# Patient Record
Sex: Male | Born: 1960 | Race: White | Hispanic: No | Marital: Single | Smoking: Current every day smoker
Health system: Southern US, Community
[De-identification: ages and names within clinical notes are randomized; demographics above are authoritative.]

## PROBLEM LIST (undated history)

## (undated) DIAGNOSIS — E785 Hyperlipidemia, unspecified: Secondary | ICD-10-CM

## (undated) DIAGNOSIS — K219 Gastro-esophageal reflux disease without esophagitis: Secondary | ICD-10-CM

## (undated) DIAGNOSIS — G8929 Other chronic pain: Secondary | ICD-10-CM

## (undated) DIAGNOSIS — G709 Myoneural disorder, unspecified: Secondary | ICD-10-CM

## (undated) DIAGNOSIS — I219 Acute myocardial infarction, unspecified: Secondary | ICD-10-CM

## (undated) DIAGNOSIS — F419 Anxiety disorder, unspecified: Secondary | ICD-10-CM

## (undated) DIAGNOSIS — M542 Cervicalgia: Secondary | ICD-10-CM

## (undated) DIAGNOSIS — E119 Type 2 diabetes mellitus without complications: Secondary | ICD-10-CM

## (undated) DIAGNOSIS — I2510 Atherosclerotic heart disease of native coronary artery without angina pectoris: Secondary | ICD-10-CM

## (undated) DIAGNOSIS — R51 Headache: Secondary | ICD-10-CM

## (undated) DIAGNOSIS — M1990 Unspecified osteoarthritis, unspecified site: Secondary | ICD-10-CM

## (undated) DIAGNOSIS — M961 Postlaminectomy syndrome, not elsewhere classified: Secondary | ICD-10-CM

## (undated) DIAGNOSIS — M549 Dorsalgia, unspecified: Secondary | ICD-10-CM

## (undated) DIAGNOSIS — R519 Headache, unspecified: Secondary | ICD-10-CM

## (undated) DIAGNOSIS — I10 Essential (primary) hypertension: Secondary | ICD-10-CM

## (undated) HISTORY — PX: CARDIAC CATHETERIZATION: SHX172

## (undated) HISTORY — PX: BACK SURGERY: SHX140

## (undated) HISTORY — PX: CORONARY ANGIOPLASTY: SHX604

## (undated) HISTORY — DX: Type 2 diabetes mellitus without complications: E11.9

## (undated) HISTORY — DX: Essential (primary) hypertension: I10

## (undated) HISTORY — PX: TONSILLECTOMY: SUR1361

## (undated) HISTORY — PX: APPENDECTOMY: SHX54

---

## 2008-07-23 ENCOUNTER — Ambulatory Visit (HOSPITAL_COMMUNITY): Admission: RE | Admit: 2008-07-23 | Discharge: 2008-07-23 | Payer: Self-pay | Admitting: Family Medicine

## 2008-12-15 ENCOUNTER — Ambulatory Visit (HOSPITAL_COMMUNITY): Admission: RE | Admit: 2008-12-15 | Discharge: 2008-12-15 | Payer: Self-pay | Admitting: Family Medicine

## 2011-08-26 ENCOUNTER — Emergency Department (HOSPITAL_COMMUNITY)
Admission: EM | Admit: 2011-08-26 | Discharge: 2011-08-26 | Disposition: A | Discharge status: Home / Self Care | Payer: Self-pay | Attending: Emergency Medicine | Admitting: Emergency Medicine

## 2011-08-26 ENCOUNTER — Encounter (HOSPITAL_COMMUNITY): Payer: Self-pay

## 2011-08-26 DIAGNOSIS — I10 Essential (primary) hypertension: Secondary | ICD-10-CM | POA: Insufficient documentation

## 2011-08-26 DIAGNOSIS — E119 Type 2 diabetes mellitus without complications: Secondary | ICD-10-CM | POA: Insufficient documentation

## 2011-08-26 DIAGNOSIS — I2510 Atherosclerotic heart disease of native coronary artery without angina pectoris: Secondary | ICD-10-CM | POA: Insufficient documentation

## 2011-08-26 DIAGNOSIS — Z7982 Long term (current) use of aspirin: Secondary | ICD-10-CM | POA: Insufficient documentation

## 2011-08-26 DIAGNOSIS — M5126 Other intervertebral disc displacement, lumbar region: Secondary | ICD-10-CM | POA: Insufficient documentation

## 2011-08-26 DIAGNOSIS — M5430 Sciatica, unspecified side: Secondary | ICD-10-CM

## 2011-08-26 HISTORY — DX: Anxiety disorder, unspecified: F41.9

## 2011-08-26 HISTORY — DX: Atherosclerotic heart disease of native coronary artery without angina pectoris: I25.10

## 2011-08-26 MED ORDER — METHOCARBAMOL 500 MG PO TABS: ORAL_TABLET | ORAL | Status: DC

## 2011-08-26 MED ORDER — KETOROLAC TROMETHAMINE 60 MG/2ML IM SOLN
60.0000 mg | Freq: Once | INTRAMUSCULAR | Status: AC
  Administered 2011-08-26: 60 mg via INTRAMUSCULAR
  Filled 2011-08-26: qty 2

## 2011-08-26 MED ORDER — NAPROXEN 500 MG PO TABS: 500.0000 mg | ORAL_TABLET | Freq: Two times a day (BID) | ORAL | Status: DC

## 2011-08-26 MED ORDER — OXYCODONE-ACETAMINOPHEN 5-325 MG PO TABS
1.0000 | ORAL_TABLET | Freq: Once | ORAL | Status: AC
  Administered 2011-08-26: 1 via ORAL
  Filled 2011-08-26: qty 1

## 2011-08-26 MED ORDER — HYDROCODONE-ACETAMINOPHEN 7.5-325 MG PO TABS: 1.0000 | ORAL_TABLET | Freq: Four times a day (QID) | ORAL | Status: AC | PRN

## 2011-08-26 NOTE — Progress Notes (Signed)
CSW received call from pt this morning regarding pain.  Pt states he does not have a PCP and [facility] is closed.  CSW advised if pain was that severe then pt may need to be seen in ED which pt agreed.  Karn Cassis

## 2011-08-26 NOTE — ED Notes (Signed)
Pt states he had back surgery in 2009. Complain of pain in both legs that he thinks is coming from his low back

## 2011-08-26 NOTE — ED Provider Notes (Signed)
History     CSN: [id number]  Arrival date & time 08/26/11  2952   First MD Initiated Contact with Patient 08/26/11 1100      Chief Complaint  Patient presents with  . Leg Pain    (Consider location/radiation/quality/duration/timing/severity/associated sxs/prior treatment) HPI Comments: Patient c/o chronic low back pain that radiates into his bilateral hips and into his thighs.  Describes the pain as dull with occasional sharp pains.  He denies numbness, incontinence of feces or urine, abd pain or dysuria.    Patient is a 51 y.o. male presenting with back pain. The history is provided by the patient. No language interpreter was used.  Back Pain  This is a chronic problem. The current episode started more than 1 week ago. The problem occurs constantly. The problem has been gradually worsening. The pain is associated with no known injury. The pain is present in the lumbar spine and sacro-iliac joint. The quality of the pain is described as aching. The pain radiates to the right thigh and left thigh. The pain is at a severity of 9/10. The pain is moderate. The symptoms are aggravated by bending, twisting and certain positions. The pain is the same all the time. Associated symptoms include leg pain and tingling. Pertinent negatives include no chest pain, no fever, no numbness, no abdominal pain, no abdominal swelling, no bowel incontinence, no perianal numbness, no bladder incontinence, no dysuria, no pelvic pain, no paresthesias, no paresis and no weakness. Treatments tried: tramadol. The treatment provided no relief.    Past Medical History  Diagnosis Date  . Anxiety   . Hypertension   . Diabetes mellitus   . Coronary artery disease     Past Surgical History  Procedure Date  . Back surgery   . Appendectomy   . Tonsillectomy     History reviewed. No pertinent family history.  History  Substance Use Topics  . Smoking status: Current Everyday Smoker    Types: Cigarettes  .  Smokeless tobacco: Not on file  . Alcohol Use: No      Review of Systems  Constitutional: Negative for fever and chills.  HENT: Negative for neck pain and neck stiffness.   Respiratory: Negative for chest tightness and shortness of breath.   Cardiovascular: Negative for chest pain.  Gastrointestinal: Negative for nausea, vomiting, abdominal pain and bowel incontinence.  Genitourinary: Negative for bladder incontinence, dysuria, hematuria, flank pain, difficulty urinating and pelvic pain.  Musculoskeletal: Positive for back pain. Negative for myalgias, joint swelling, arthralgias and gait problem.  Skin: Negative for rash.  Neurological: Positive for tingling. Negative for dizziness, weakness, numbness and paresthesias.  Hematological: Does not bruise/bleed easily.  All other systems reviewed and are negative.    Allergies  Review of patient's allergies indicates no known allergies.  Home Medications   Current Outpatient Rx  Name Route Sig Dispense Refill  . ACETAMINOPHEN 500 MG PO TABS Oral Take 1,000 mg by mouth every 6 (six) hours as needed. Leg pain    . ASPIRIN EC 81 MG PO TBEC Oral Take 81 mg by mouth daily.    Marland Kitchen GABAPENTIN 600 MG PO TABS Oral Take 600 mg by mouth 3 (three) times daily.    . IBUPROFEN 200 MG PO TABS Oral Take 400 mg by mouth every 8 (eight) hours as needed. Leg pain    . METFORMIN HCL 500 MG PO TABS Oral Take 500 mg by mouth 2 (two) times daily with a meal. Takes 2 tabs in the  morning and 1 tablet in the evening    . METOPROLOL TARTRATE 100 MG PO TABS Oral Take 100 mg by mouth daily.    Marland Kitchen NAPROXEN SODIUM 220 MG PO CAPS Oral Take 2 capsules by mouth every 12 (twelve) hours as needed. Leg pain    . PRAVASTATIN SODIUM 40 MG PO TABS Oral Take by mouth at bedtime.    . QUINAPRIL-HYDROCHLOROTHIAZIDE 20-25 MG PO TABS Oral Take 1 tablet by mouth daily.    . TRAMADOL-ACETAMINOPHEN 37.5-325 MG PO TABS Oral Take 2 tablets by mouth 2 (two) times daily.      BP 130/85   Pulse 81  Temp(Src) 98.2 F (36.8 C) (Oral)  Resp 20  SpO2 100%  Physical Exam  Nursing note and vitals reviewed. Constitutional: He is oriented to person, place, and time. He appears well-developed and well-nourished. No distress.  HENT:  Head: Normocephalic and atraumatic.  Neck: Normal range of motion. Neck supple.  Cardiovascular: Normal rate, regular rhythm and normal heart sounds.   Pulmonary/Chest: Effort normal and breath sounds normal. No respiratory distress.  Abdominal: Soft. He exhibits no distension. There is no tenderness.  Musculoskeletal: Normal range of motion. He exhibits tenderness. He exhibits no edema.       Lumbar back: He exhibits tenderness and bony tenderness. He exhibits normal range of motion, no swelling, no edema, no deformity, no laceration and normal pulse.       Back:  Lymphadenopathy:    He has no cervical adenopathy.  Neurological: He is alert and oriented to person, place, and time. No sensory deficit. He exhibits normal muscle tone. Coordination and gait normal.  Reflex Scores:      Patellar reflexes are 2+ on the right side and 2+ on the left side.      Achilles reflexes are 2+ on the right side and 2+ on the left side. Skin: Skin is warm and dry.    ED Course  Procedures (including critical care time)      MDM    MR from 2010 was reviewed by me.  Shows disc extrusion in the left lateral recess at L5-S1 with mass  effect on the left S1 nerve root.    No focal neuro deficits on exam, ttp of the lower lumbar paraspinal muscles and SI joints.  Patient ambulates with steady gait.  He agrees to f/u with his pmd at the health dept.         Staley Budzinski L. Desiraye Rolfson, [place] 08/28/11 2030

## 2011-08-29 NOTE — ED Provider Notes (Signed)
Medical screening examination/treatment/procedure(s) were performed by non-physician practitioner and as supervising physician I was immediately available for consultation/collaboration. Ricketta Colantonio, MD, FACEP   Bexlee Bergdoll L Osha Errico, MD 08/29/11 0506 

## 2011-09-28 ENCOUNTER — Ambulatory Visit: Payer: Self-pay | Admitting: Family Medicine

## 2011-09-28 VITALS — BP 155/89 | HR 59 | Temp 98.1°F | Resp 16 | Ht 74.5 in | Wt 250.0 lb

## 2011-09-28 DIAGNOSIS — E7800 Pure hypercholesterolemia, unspecified: Secondary | ICD-10-CM

## 2011-09-28 DIAGNOSIS — M549 Dorsalgia, unspecified: Secondary | ICD-10-CM

## 2011-09-28 DIAGNOSIS — I10 Essential (primary) hypertension: Secondary | ICD-10-CM

## 2011-09-28 DIAGNOSIS — E119 Type 2 diabetes mellitus without complications: Secondary | ICD-10-CM | POA: Insufficient documentation

## 2011-09-28 MED ORDER — PRAVASTATIN SODIUM 40 MG PO TABS: 40.0000 mg | ORAL_TABLET | Freq: Every day | ORAL | Status: DC

## 2011-09-28 MED ORDER — NAPROXEN 500 MG PO TABS: 500.0000 mg | ORAL_TABLET | Freq: Two times a day (BID) | ORAL | Status: DC

## 2011-09-28 MED ORDER — METOPROLOL TARTRATE 100 MG PO TABS: 100.0000 mg | ORAL_TABLET | Freq: Every day | ORAL | Status: DC

## 2011-09-28 MED ORDER — TRAMADOL-ACETAMINOPHEN 37.5-325 MG PO TABS: 2.0000 | ORAL_TABLET | Freq: Two times a day (BID) | ORAL | Status: DC

## 2011-09-28 MED ORDER — HYDROCODONE-ACETAMINOPHEN 10-325 MG PO TABS: 1.0000 | ORAL_TABLET | Freq: Three times a day (TID) | ORAL | Status: AC | PRN

## 2011-09-28 MED ORDER — QUINAPRIL-HYDROCHLOROTHIAZIDE 20-25 MG PO TABS: 1.0000 | ORAL_TABLET | Freq: Every day | ORAL | Status: DC

## 2011-09-28 NOTE — Progress Notes (Signed)
  Subjective:    Patient ID: Clayton Foster, male    DOB: [birth year], 51 y.o.   MRN: [id number]  HPI 51 yo male with multiple medical problems including DM, HTN, HLD, and back pain here to establish care and obtain medication refills.  Brings some medical records with him from [facility] - most recent visit 2010 but labs done in Nov 2012.  Also has MRI and xray reports of spine.  Today, has run out of some meds and close to running out of some others.  Needs ultracet, naprosyn, metoprolol, accuretic, pravastatin.  Also requests norco.  States [place] wouldn't do pain pills anymore.   Pain is from bulging discs in his back.  Ultracet and naprosyn help but don't take them away completely.  Labs, including cmet and FLP just done in November 2012.  A1C was 6.1%.  Does not need metformin refill today.        Review of Systems Negative except as per HPI     Objective:   Physical Exam  Constitutional: He appears well-developed and well-nourished.  Cardiovascular: Normal rate, regular rhythm, normal heart sounds and intact distal pulses.   No murmur heard. Pulmonary/Chest: Effort normal and breath sounds normal.  Neurological: He is alert.  Skin: Skin is warm and dry.          Assessment & Plan:  HTN, HLD - meds refilled  Chronic pain - refilled ultracet and naprosyn.  Discussed we don't do chronic narcotics.  Can do one Norco rx, one time only, to help with pain while we send him to pain doctor.  Will call with name of doctor he would like to see ( has been researching on internet )

## 2011-10-10 ENCOUNTER — Telehealth: Payer: Self-pay

## 2011-10-10 DIAGNOSIS — M549 Dorsalgia, unspecified: Secondary | ICD-10-CM

## 2011-10-10 NOTE — Telephone Encounter (Signed)
Patient saw Dr. Georgiana Shore and she asked what doctor he would like to be referred to for pain management. He states he wants to see Dr. Craig Guess, office phone [phone].

## 2011-12-26 ENCOUNTER — Encounter: Payer: Self-pay | Admitting: Family Medicine

## 2011-12-31 ENCOUNTER — Encounter (HOSPITAL_COMMUNITY): Payer: Self-pay | Admitting: *Deleted

## 2011-12-31 ENCOUNTER — Emergency Department (HOSPITAL_COMMUNITY)
Admission: EM | Admit: 2011-12-31 | Discharge: 2011-12-31 | Disposition: A | Discharge status: Home / Self Care | Payer: Self-pay | Attending: Emergency Medicine | Admitting: Emergency Medicine

## 2011-12-31 DIAGNOSIS — E119 Type 2 diabetes mellitus without complications: Secondary | ICD-10-CM | POA: Insufficient documentation

## 2011-12-31 DIAGNOSIS — F17200 Nicotine dependence, unspecified, uncomplicated: Secondary | ICD-10-CM | POA: Insufficient documentation

## 2011-12-31 DIAGNOSIS — I2510 Atherosclerotic heart disease of native coronary artery without angina pectoris: Secondary | ICD-10-CM | POA: Insufficient documentation

## 2011-12-31 DIAGNOSIS — M5416 Radiculopathy, lumbar region: Secondary | ICD-10-CM

## 2011-12-31 DIAGNOSIS — I10 Essential (primary) hypertension: Secondary | ICD-10-CM | POA: Insufficient documentation

## 2011-12-31 DIAGNOSIS — Z79899 Other long term (current) drug therapy: Secondary | ICD-10-CM | POA: Insufficient documentation

## 2011-12-31 DIAGNOSIS — G8929 Other chronic pain: Secondary | ICD-10-CM | POA: Insufficient documentation

## 2011-12-31 DIAGNOSIS — IMO0002 Reserved for concepts with insufficient information to code with codable children: Secondary | ICD-10-CM | POA: Insufficient documentation

## 2011-12-31 DIAGNOSIS — Z7982 Long term (current) use of aspirin: Secondary | ICD-10-CM | POA: Insufficient documentation

## 2011-12-31 HISTORY — DX: Other chronic pain: G89.29

## 2011-12-31 HISTORY — DX: Dorsalgia, unspecified: M54.9

## 2011-12-31 MED ORDER — METHOCARBAMOL 500 MG PO TABS
1000.0000 mg | ORAL_TABLET | Freq: Once | ORAL | Status: AC
  Administered 2011-12-31: 1000 mg via ORAL
  Filled 2011-12-31: qty 2

## 2011-12-31 MED ORDER — PREDNISONE 10 MG PO TABS: ORAL_TABLET | ORAL | Status: DC

## 2011-12-31 MED ORDER — OXYCODONE-ACETAMINOPHEN 5-325 MG PO TABS: 1.0000 | ORAL_TABLET | ORAL | Status: AC | PRN

## 2011-12-31 MED ORDER — HYDROMORPHONE HCL PF 1 MG/ML IJ SOLN
1.0000 mg | Freq: Once | INTRAMUSCULAR | Status: AC
  Administered 2011-12-31: 1 mg via INTRAMUSCULAR
  Filled 2011-12-31: qty 1

## 2011-12-31 NOTE — ED Notes (Signed)
Chronic back pain that radiates down both legs

## 2011-12-31 NOTE — Discharge Instructions (Signed)

## 2012-01-05 NOTE — ED Provider Notes (Signed)
History     CSN: [id number]  Arrival date & time 12/31/11  1323   First MD Initiated Contact with Patient 12/31/11 1407      Chief Complaint  Patient presents with  . Back Pain    (Consider location/radiation/quality/duration/timing/severity/associated sxs/prior treatment) Patient is a 51 y.o. male presenting with back pain. The history is provided by the patient.  Back Pain  This is a chronic problem. Episode onset: worsening pain for 3-4 days. The problem occurs constantly. The problem has been gradually worsening. The pain is associated with no known injury. The pain is present in the lumbar spine and sacro-iliac joint. The quality of the pain is described as aching, burning and shooting. The pain radiates to the left thigh, right thigh, left knee and right knee. The pain is moderate. The symptoms are aggravated by bending, twisting and certain positions. Associated symptoms include leg pain. Pertinent negatives include no chest pain, no fever, no numbness, no abdominal pain, no abdominal swelling, no bowel incontinence, no perianal numbness, no bladder incontinence, no dysuria, no pelvic pain, no paresthesias, no paresis, no tingling and no weakness. He has tried muscle relaxants, analgesics and NSAIDs for the symptoms. The treatment provided mild relief.    Past Medical History  Diagnosis Date  . Anxiety   . Hypertension   . Diabetes mellitus   . Coronary artery disease   . Chronic back pain     Past Surgical History  Procedure Date  . Back surgery   . Appendectomy   . Tonsillectomy     History reviewed. No pertinent family history.  History  Substance Use Topics  . Smoking status: Current Everyday Smoker -- 1.0 packs/day    Types: Cigarettes  . Smokeless tobacco: Not on file  . Alcohol Use: No      Review of Systems  Constitutional: Negative for fever and appetite change.  Respiratory: Negative for shortness of breath.   Cardiovascular: Negative for chest pain.   Gastrointestinal: Negative for vomiting, abdominal pain, constipation and bowel incontinence.  Genitourinary: Negative for bladder incontinence, dysuria, hematuria, flank pain, decreased urine volume, difficulty urinating and pelvic pain.       Perineal numbness or incontinence of urine or feces  Musculoskeletal: Positive for back pain. Negative for joint swelling.  Skin: Negative for rash.  Neurological: Negative for tingling, weakness, numbness and paresthesias.  All other systems reviewed and are negative.    Allergies  Review of patient's allergies indicates no known allergies.  Home Medications   Current Outpatient Rx  Name Route Sig Dispense Refill  . ACETAMINOPHEN 500 MG PO TABS Oral Take 1,000 mg by mouth every 6 (six) hours as needed. Leg pain    . ASPIRIN EC 81 MG PO TBEC Oral Take 81 mg by mouth daily.    Marland Kitchen GABAPENTIN 600 MG PO TABS Oral Take 600 mg by mouth 3 (three) times daily.    . IBUPROFEN 200 MG PO TABS Oral Take 400 mg by mouth every 8 (eight) hours as needed. Leg pain    . METFORMIN HCL 500 MG PO TABS Oral Take 500 mg by mouth 2 (two) times daily with a meal. Takes 2 tabs in the morning and 1 tablet in the evening    . METOPROLOL TARTRATE 100 MG PO TABS Oral Take 1 tablet (100 mg total) by mouth daily. 30 tablet 1  . NAPROXEN 500 MG PO TABS Oral Take 1 tablet (500 mg total) by mouth 2 (two) times daily. Take with  food 60 tablet 1  . PRAVASTATIN SODIUM 40 MG PO TABS Oral Take 1 tablet (40 mg total) by mouth at bedtime. 30 tablet 1  . PSEUDOEPHEDRINE-GUAIFENESIN ER 60-600 MG PO TB12 Oral Take 1 tablet by mouth as needed. For congestion    . QUINAPRIL-HYDROCHLOROTHIAZIDE 20-25 MG PO TABS Oral Take 1 tablet by mouth daily. 30 tablet 1  . TRAMADOL-ACETAMINOPHEN 37.5-325 MG PO TABS Oral Take 2 tablets by mouth 2 (two) times daily. 120 tablet 1  . OXYCODONE-ACETAMINOPHEN 5-325 MG PO TABS Oral Take 1 tablet by mouth every 4 (four) hours as needed for pain. 24 tablet 0  .  PREDNISONE 10 MG PO TABS  Take 6 tablets day one, 5 tablets day two, 4 tablets day three, 3 tablets day four, 2 tablets day five, then 1 tablet day six 21 tablet 0    BP 138/80  Pulse 66  Temp 97.4 F (36.3 C) (Oral)  Resp 21  Ht 6\' 3"  (1.905 m)  Wt 245 lb (111.131 kg)  BMI 30.62 kg/m2  SpO2 100%  Physical Exam  Nursing note and vitals reviewed. Constitutional: He is oriented to person, place, and time. He appears well-developed and well-nourished. No distress.  HENT:  Head: Normocephalic and atraumatic.  Neck: Normal range of motion. Neck supple.  Cardiovascular: Normal rate, regular rhythm and intact distal pulses.   No murmur heard. Pulmonary/Chest: Effort normal and breath sounds normal.  Musculoskeletal: He exhibits tenderness. He exhibits no edema.       Lumbar back: He exhibits tenderness and pain. He exhibits normal range of motion, no swelling, no deformity, no laceration and normal pulse.       Back:  Neurological: He is alert and oriented to person, place, and time. No cranial nerve deficit or sensory deficit. He exhibits normal muscle tone. Coordination and gait normal.  Reflex Scores:      Patellar reflexes are 2+ on the right side and 2+ on the left side.      Achilles reflexes are 2+ on the right side and 2+ on the left side. Skin: Skin is warm and dry.    ED Course  Procedures (including critical care time)    1. Lumbar radiculopathy       MDM    Previous medical charts, nursing notes and vitals signs from this visit were reviewed by me   All laboratory results and/or imaging results performed on this visit, if applicable, were reviewed by me and discussed with the patient and/or parent as well as recommendation for follow-up    MEDICATIONS GIVEN IN ED:  Dilaudid IM, robaxin po  Patient is feeling better,  has diffuse ttp of the lumbar paraspinal muscles.  No focal neuro deficits on exam.  Ambulates with a steady gait. Reviewed previous MRI.  Pt  advised to f/u with PMD      PRESCRIPTIONS GIVEN AT DISCHARGE:  prednisone taper, percocet #24     Pt stable in ED with no significant deterioration in condition. Pt feels improved after observation and/or treatment in ED. Patient / Family / Caregiver understand and agree with initial ED impression and plan with expectations set for ED visit.  Patient agrees to return to ED for any worsening symptoms        Arrion Burruel L. Zoey Gilkeson, [place] 01/05/12 1401

## 2012-01-06 NOTE — ED Provider Notes (Signed)
Medical screening examination/treatment/procedure(s) were performed by non-physician practitioner and as supervising physician I was immediately available for consultation/collaboration.  Donnetta Hutching, MD 01/06/12 1314

## 2012-02-16 ENCOUNTER — Encounter (HOSPITAL_COMMUNITY): Payer: Self-pay

## 2012-02-16 ENCOUNTER — Emergency Department (HOSPITAL_COMMUNITY)
Admission: EM | Admit: 2012-02-16 | Discharge: 2012-02-16 | Disposition: A | Discharge status: Home / Self Care | Payer: Self-pay | Attending: Emergency Medicine | Admitting: Emergency Medicine

## 2012-02-16 DIAGNOSIS — I10 Essential (primary) hypertension: Secondary | ICD-10-CM | POA: Insufficient documentation

## 2012-02-16 DIAGNOSIS — G8929 Other chronic pain: Secondary | ICD-10-CM | POA: Insufficient documentation

## 2012-02-16 DIAGNOSIS — F411 Generalized anxiety disorder: Secondary | ICD-10-CM | POA: Insufficient documentation

## 2012-02-16 DIAGNOSIS — E119 Type 2 diabetes mellitus without complications: Secondary | ICD-10-CM | POA: Insufficient documentation

## 2012-02-16 DIAGNOSIS — M549 Dorsalgia, unspecified: Secondary | ICD-10-CM

## 2012-02-16 DIAGNOSIS — F17200 Nicotine dependence, unspecified, uncomplicated: Secondary | ICD-10-CM | POA: Insufficient documentation

## 2012-02-16 DIAGNOSIS — M545 Low back pain, unspecified: Secondary | ICD-10-CM | POA: Insufficient documentation

## 2012-02-16 DIAGNOSIS — Z9089 Acquired absence of other organs: Secondary | ICD-10-CM | POA: Insufficient documentation

## 2012-02-16 DIAGNOSIS — I2510 Atherosclerotic heart disease of native coronary artery without angina pectoris: Secondary | ICD-10-CM | POA: Insufficient documentation

## 2012-02-16 MED ORDER — HYDROMORPHONE HCL PF 2 MG/ML IJ SOLN
INTRAMUSCULAR | Status: AC
  Administered 2012-02-16: 2 mg via INTRAMUSCULAR
  Filled 2012-02-16: qty 1

## 2012-02-16 MED ORDER — CYCLOBENZAPRINE HCL 10 MG PO TABS: 10.0000 mg | ORAL_TABLET | Freq: Three times a day (TID) | ORAL | Status: AC | PRN

## 2012-02-16 MED ORDER — OXYCODONE-ACETAMINOPHEN 5-325 MG PO TABS: 1.0000 | ORAL_TABLET | ORAL | Status: AC | PRN

## 2012-02-16 MED ORDER — HYDROMORPHONE HCL PF 1 MG/ML IJ SOLN: 2.0000 mg | Freq: Once | INTRAMUSCULAR | Status: AC

## 2012-02-16 NOTE — ED Notes (Signed)
Pt with chronic lower back pain that radiates down into both legs per pt, pt denies PCP due to no insurance and states that he is trying to get disability, unable to go to pain management as well

## 2012-02-16 NOTE — ED Notes (Signed)
Pt reports chronic pain to his back and legs bilaterally.  Pt reports that he is out of his pain medication and does not have insurance to see a doctor.

## 2012-02-16 NOTE — ED Provider Notes (Signed)
History     CSN: [id number]  Arrival date & time 02/16/12  1522   First MD Initiated Contact with Patient 02/16/12 1543      Chief Complaint  Patient presents with  . Back Pain  . Leg Pain    (Consider location/radiation/quality/duration/timing/severity/associated sxs/prior treatment) HPI Comments: Patient c/o acute on chronic lower back pain that radiates into both legs.  States he is awaiting for approval of his disability.  States he is out of his pain medication.  Denies incontinence or urine or feces.  States pain today is similar to previous   Patient is a 51 y.o. male presenting with back pain. The history is provided by the patient.  Back Pain  This is a chronic problem. The problem occurs constantly. The problem has been gradually worsening. The pain is associated with no known injury. The pain is present in the lumbar spine. The quality of the pain is described as aching. The pain radiates to the left thigh, left knee, left foot, right thigh, right knee and right foot. The pain is severe. The symptoms are aggravated by twisting, bending and certain positions. The pain is the same all the time. Associated symptoms include leg pain. Pertinent negatives include no chest pain, no fever, no numbness, no abdominal pain, no abdominal swelling, no bowel incontinence, no perianal numbness, no bladder incontinence, no dysuria, no pelvic pain, no paresthesias, no paresis, no tingling and no weakness. He has tried muscle relaxants and NSAIDs for the symptoms. The treatment provided no relief.    Past Medical History  Diagnosis Date  . Anxiety   . Hypertension   . Diabetes mellitus   . Coronary artery disease   . Chronic back pain     Past Surgical History  Procedure Date  . Back surgery   . Appendectomy   . Tonsillectomy     No family history on file.  History  Substance Use Topics  . Smoking status: Current Everyday Smoker -- 1.0 packs/day    Types: Cigarettes  . Smokeless  tobacco: Not on file  . Alcohol Use: No      Review of Systems  Constitutional: Negative for fever.  Respiratory: Negative for shortness of breath.   Cardiovascular: Negative for chest pain.  Gastrointestinal: Negative for vomiting, abdominal pain, constipation and bowel incontinence.  Genitourinary: Negative for bladder incontinence, dysuria, hematuria, flank pain, decreased urine volume, difficulty urinating and pelvic pain.       Perineal numbness or incontinence of urine or feces  Musculoskeletal: Positive for back pain. Negative for joint swelling.  Skin: Negative for rash.  Neurological: Negative for tingling, weakness, numbness and paresthesias.  All other systems reviewed and are negative.    Allergies  Review of patient's allergies indicates no known allergies.  Home Medications   Current Outpatient Rx  Name Route Sig Dispense Refill  . ACETAMINOPHEN 500 MG PO TABS Oral Take 1,000 mg by mouth every 6 (six) hours as needed. Leg pain    . ASPIRIN EC 81 MG PO TBEC Oral Take 81 mg by mouth daily.    Marland Kitchen GABAPENTIN 600 MG PO TABS Oral Take 600 mg by mouth 3 (three) times daily.    . IBUPROFEN 200 MG PO TABS Oral Take 400 mg by mouth every 8 (eight) hours as needed. Leg pain    . METFORMIN HCL 500 MG PO TABS Oral Take 500 mg by mouth 2 (two) times daily with a meal. Takes 2 tabs in the morning and 1  tablet in the evening    . METOPROLOL TARTRATE 100 MG PO TABS Oral Take 1 tablet (100 mg total) by mouth daily. 30 tablet 1  . NAPROXEN 500 MG PO TABS Oral Take 1 tablet (500 mg total) by mouth 2 (two) times daily. Take with food 60 tablet 1  . PRAVASTATIN SODIUM 40 MG PO TABS Oral Take 1 tablet (40 mg total) by mouth at bedtime. 30 tablet 1  . PREDNISONE 10 MG PO TABS  Take 6 tablets day one, 5 tablets day two, 4 tablets day three, 3 tablets day four, 2 tablets day five, then 1 tablet day six 21 tablet 0  . PSEUDOEPHEDRINE-GUAIFENESIN ER 60-600 MG PO TB12 Oral Take 1 tablet by mouth  as needed. For congestion    . QUINAPRIL-HYDROCHLOROTHIAZIDE 20-25 MG PO TABS Oral Take 1 tablet by mouth daily. 30 tablet 1  . TRAMADOL-ACETAMINOPHEN 37.5-325 MG PO TABS Oral Take 2 tablets by mouth 2 (two) times daily. 120 tablet 1    BP 138/111  Pulse 106  Temp 97.8 F (36.6 C) (Oral)  Resp 22  Ht 6\' 3"  (1.905 m)  Wt 245 lb (111.131 kg)  BMI 30.62 kg/m2  SpO2 99%  Physical Exam  Nursing note and vitals reviewed. Constitutional: He is oriented to person, place, and time. He appears well-developed and well-nourished. No distress.  HENT:  Head: Normocephalic and atraumatic.  Neck: Normal range of motion. Neck supple.  Cardiovascular: Normal rate, regular rhythm and intact distal pulses.   No murmur heard. Pulmonary/Chest: Effort normal and breath sounds normal.  Musculoskeletal: He exhibits tenderness. He exhibits no edema.       Lumbar back: He exhibits tenderness and pain. He exhibits normal range of motion, no swelling, no deformity, no laceration and normal pulse.       Back:  Neurological: He is alert and oriented to person, place, and time. No cranial nerve deficit or sensory deficit. He exhibits normal muscle tone. Coordination and gait normal.  Reflex Scores:      Patellar reflexes are 2+ on the right side and 2+ on the left side.      Achilles reflexes are 2+ on the right side and 2+ on the left side. Skin: Skin is warm and dry.    ED Course  Procedures (including critical care time)  Labs Reviewed - No data to display      MDM    Patient has ttp of the lumbar paraspinal muscles.  No focal neuro deficits on exam.  Ambulates with slight limp.  Low back pain that appears chronic.  Pt is waiting for approval of his disability.  Patient has h/o similar pain and previous ED visits for same. Doubtful of infectious process or cauda equina syndrome  Vitals rechecked, improved.  Pain improved.    Reviewed previous MRI report from 2010 that shows:  1. 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 There is also bilateral  foraminal stenosis at this level, and moderate right subarticular  lateral recess stenosis in addition to moderate central stenosis.  2. Congenitally short pedicles, lumbar epidural lipomatosis, and  spondylosis and degenerative disc disease cause central stenosis at  the L2-3, L3-4, and L4-5 levels, most prominently at L3-4.  3. Postoperative findings are noted on the right at L5, S1.    The patient appears reasonably screened and/or stabilized for discharge and I doubt any other medical condition or other [facility]  requiring further screening, evaluation, or treatment in the ED at this time prior to discharge.   Prescribed: Flexeril  Percocet #20   Raelin Pixler L. [place], [place] 02/21/12 1648

## 2012-02-20 ENCOUNTER — Emergency Department (HOSPITAL_COMMUNITY)
Admission: EM | Admit: 2012-02-20 | Discharge: 2012-02-20 | Disposition: A | Discharge status: Home / Self Care | Payer: Self-pay | Attending: Emergency Medicine | Admitting: Emergency Medicine

## 2012-02-20 ENCOUNTER — Encounter (HOSPITAL_COMMUNITY): Payer: Self-pay | Admitting: Emergency Medicine

## 2012-02-20 DIAGNOSIS — E119 Type 2 diabetes mellitus without complications: Secondary | ICD-10-CM | POA: Insufficient documentation

## 2012-02-20 DIAGNOSIS — I10 Essential (primary) hypertension: Secondary | ICD-10-CM | POA: Insufficient documentation

## 2012-02-20 DIAGNOSIS — Z79899 Other long term (current) drug therapy: Secondary | ICD-10-CM | POA: Insufficient documentation

## 2012-02-20 DIAGNOSIS — M5416 Radiculopathy, lumbar region: Secondary | ICD-10-CM

## 2012-02-20 DIAGNOSIS — I2510 Atherosclerotic heart disease of native coronary artery without angina pectoris: Secondary | ICD-10-CM | POA: Insufficient documentation

## 2012-02-20 DIAGNOSIS — G8929 Other chronic pain: Secondary | ICD-10-CM | POA: Insufficient documentation

## 2012-02-20 DIAGNOSIS — IMO0002 Reserved for concepts with insufficient information to code with codable children: Secondary | ICD-10-CM | POA: Insufficient documentation

## 2012-02-20 DIAGNOSIS — Z9089 Acquired absence of other organs: Secondary | ICD-10-CM | POA: Insufficient documentation

## 2012-02-20 DIAGNOSIS — F17200 Nicotine dependence, unspecified, uncomplicated: Secondary | ICD-10-CM | POA: Insufficient documentation

## 2012-02-20 DIAGNOSIS — F411 Generalized anxiety disorder: Secondary | ICD-10-CM | POA: Insufficient documentation

## 2012-02-20 MED ORDER — OXYCODONE-ACETAMINOPHEN 5-325 MG PO TABS
ORAL_TABLET | ORAL | Status: DC
Start: 2012-02-20 — End: 2012-04-09

## 2012-02-20 MED ORDER — HYDROMORPHONE HCL PF 2 MG/ML IJ SOLN
2.0000 mg | Freq: Once | INTRAMUSCULAR | Status: AC
  Administered 2012-02-20: 2 mg via INTRAMUSCULAR

## 2012-02-20 MED ORDER — ONDANSETRON 4 MG PO TBDP
4.0000 mg | ORAL_TABLET | Freq: Once | ORAL | Status: AC
  Administered 2012-02-20: 4 mg via ORAL

## 2012-02-20 MED ORDER — HYDROMORPHONE HCL PF 2 MG/ML IJ SOLN
INTRAMUSCULAR | Status: AC
  Filled 2012-02-20: qty 1

## 2012-02-20 MED ORDER — ONDANSETRON 4 MG PO TBDP
ORAL_TABLET | ORAL | Status: AC
  Filled 2012-02-20: qty 1

## 2012-02-20 NOTE — ED Provider Notes (Signed)
Medical screening examination/treatment/procedure(s) were performed by non-physician practitioner and as supervising physician I was immediately available for consultation/collaboration.   Charles B. Bernette Mayers, MD 02/20/12 [phone]

## 2012-02-20 NOTE — ED Provider Notes (Signed)
History     CSN: [id number]  Arrival date & time 02/20/12  2125   First MD Initiated Contact with Patient 02/20/12 2209      Chief Complaint  Patient presents with  . Back Pain    (Consider location/radiation/quality/duration/timing/severity/associated sxs/prior treatment) HPI Comments: Pt has known LS DDD.  He had LS spine surgery ~ 2001 by dr. Noel Gerold.  He was here 4 days ago and was referred to dr. Laurian Brim for pain management.  States his office told him tha the referral and supporting documentation had to be faxed to his office.  The pt has return b/c of the pain and asks that the info be faxed as requested.  Patient is a 51 y.o. male presenting with back pain. The history is provided by the patient. No language interpreter was used.  Back Pain  This is a chronic problem. The problem occurs constantly. The problem has been gradually worsening. The pain is associated with no known injury. The pain is present in the lumbar spine. The quality of the pain is described as aching. The pain radiates to the left foot and right foot. The pain is at a severity of 10/10. The symptoms are aggravated by bending. The pain is the same all the time. Associated symptoms include leg pain, paresthesias and tingling. Pertinent negatives include no fever, no numbness, no bowel incontinence, no perianal numbness, no bladder incontinence, no paresis and no weakness.    Past Medical History  Diagnosis Date  . Anxiety   . Hypertension   . Diabetes mellitus   . Coronary artery disease   . Chronic back pain     Past Surgical History  Procedure Date  . Back surgery   . Appendectomy   . Tonsillectomy     History reviewed. No pertinent family history.  History  Substance Use Topics  . Smoking status: Current Everyday Smoker -- 1.0 packs/day    Types: Cigarettes  . Smokeless tobacco: Not on file  . Alcohol Use: No      Review of Systems  Constitutional: Negative for fever and chills.    Gastrointestinal: Negative for bowel incontinence.  Genitourinary: Negative for bladder incontinence.  Musculoskeletal: Positive for back pain.  Neurological: Positive for tingling and paresthesias. Negative for weakness and numbness.    Allergies  Review of patient's allergies indicates no known allergies.  Home Medications   Current Outpatient Rx  Name Route Sig Dispense Refill  . ACETAMINOPHEN 500 MG PO TABS Oral Take 500-1,000 mg by mouth every 6 (six) hours as needed. for pain    . ASPIRIN EC 81 MG PO TBEC Oral Take 81 mg by mouth daily.    . CYCLOBENZAPRINE HCL 10 MG PO TABS Oral Take 1 tablet (10 mg total) by mouth 3 (three) times daily as needed for muscle spasms. 21 tablet 0  . GABAPENTIN 600 MG PO TABS Oral Take 600 mg by mouth 3 (three) times daily.    . IBUPROFEN 200 MG PO TABS Oral Take 400 mg by mouth every 6 (six) hours as needed. For pain    . METOPROLOL TARTRATE 100 MG PO TABS Oral Take 100 mg by mouth every morning.    Marland Kitchen PRAVASTATIN SODIUM 40 MG PO TABS Oral Take 1 tablet (40 mg total) by mouth at bedtime. 30 tablet 1  . PSEUDOEPHEDRINE-GUAIFENESIN ER 60-600 MG PO TB12 Oral Take 1 tablet by mouth as needed. For congestion    . QUINAPRIL-HYDROCHLOROTHIAZIDE 20-25 MG PO TABS Oral Take 1 tablet  by mouth daily. 30 tablet 1  . OXYCODONE-ACETAMINOPHEN 5-325 MG PO TABS Oral Take 1 tablet by mouth every 4 (four) hours as needed for pain. 20 tablet 0    BP 127/91  Pulse 119  Temp 97.4 F (36.3 C) (Oral)  Resp 22  Ht 6\' 3"  (1.905 m)  Wt 245 lb (111.131 kg)  BMI 30.62 kg/m2  SpO2 100%  Physical Exam  Nursing note and vitals reviewed. Constitutional: He is oriented to person, place, and time. He appears well-developed and well-nourished.  HENT:  Head: Normocephalic and atraumatic.  Eyes: EOM are normal.  Neck: Normal range of motion.  Cardiovascular: Normal rate, regular rhythm, normal heart sounds and intact distal pulses.   Pulmonary/Chest: Effort normal and  breath sounds normal. No respiratory distress.  Abdominal: Soft. He exhibits no distension. There is no tenderness.  Musculoskeletal:       Lumbar back: He exhibits decreased range of motion, tenderness, bony tenderness and pain. He exhibits no spasm and normal pulse.       Back:  Neurological: He is alert and oriented to person, place, and time.  Skin: Skin is warm and dry.  Psychiatric: He has a normal mood and affect. Judgment normal.    ED Course  Procedures (including critical care time)  Labs Reviewed - No data to display No results found.   1. Chronic low back pain   2. Lumbar radiculopathy       MDM   pt given 2 mg IM dilaudid and 4 mg ODT zofran.  Info including MRI results faxed to dr. Laurian Brim.  Pt told to call the office and make an appt.        Evalina Field, PA 02/20/12 2301  Evalina Field, PA 02/20/12 [phone]

## 2012-02-20 NOTE — ED Notes (Signed)
Pt seen here Friday for back pain and here today for same, attempted to call Dr. Val Eagle Toole's office and was unable to make an appt to be seen, states pain is 10/10

## 2012-02-20 NOTE — ED Notes (Signed)
Patient complaining of back pain radiating into bilateral legs. Was seen here on Friday for same.

## 2012-02-22 NOTE — ED Provider Notes (Signed)
Medical screening examination/treatment/procedure(s) were performed by non-physician practitioner and as supervising physician I was immediately available for consultation/collaboration.  Lamon Rotundo, MD 02/22/12 0726 

## 2012-04-09 ENCOUNTER — Emergency Department (HOSPITAL_COMMUNITY)
Admission: EM | Admit: 2012-04-09 | Discharge: 2012-04-09 | Disposition: A | Discharge status: Home / Self Care | Payer: Self-pay | Attending: Emergency Medicine | Admitting: Emergency Medicine

## 2012-04-09 ENCOUNTER — Encounter (HOSPITAL_COMMUNITY): Payer: Self-pay | Admitting: *Deleted

## 2012-04-09 DIAGNOSIS — I10 Essential (primary) hypertension: Secondary | ICD-10-CM | POA: Insufficient documentation

## 2012-04-09 DIAGNOSIS — M549 Dorsalgia, unspecified: Secondary | ICD-10-CM | POA: Insufficient documentation

## 2012-04-09 DIAGNOSIS — E119 Type 2 diabetes mellitus without complications: Secondary | ICD-10-CM | POA: Insufficient documentation

## 2012-04-09 DIAGNOSIS — F411 Generalized anxiety disorder: Secondary | ICD-10-CM | POA: Insufficient documentation

## 2012-04-09 DIAGNOSIS — G8929 Other chronic pain: Secondary | ICD-10-CM | POA: Insufficient documentation

## 2012-04-09 DIAGNOSIS — F17200 Nicotine dependence, unspecified, uncomplicated: Secondary | ICD-10-CM | POA: Insufficient documentation

## 2012-04-09 DIAGNOSIS — I2510 Atherosclerotic heart disease of native coronary artery without angina pectoris: Secondary | ICD-10-CM | POA: Insufficient documentation

## 2012-04-09 LAB — GLUCOSE, CAPILLARY: Glucose-Capillary: 365 mg/dL — ABNORMAL HIGH (ref 70–99)

## 2012-04-09 MED ORDER — KETOROLAC TROMETHAMINE 10 MG PO TABS
10.0000 mg | ORAL_TABLET | Freq: Once | ORAL | Status: AC
  Administered 2012-04-09: 10 mg via ORAL
  Filled 2012-04-09: qty 1

## 2012-04-09 MED ORDER — ONDANSETRON HCL 4 MG PO TABS
4.0000 mg | ORAL_TABLET | Freq: Once | ORAL | Status: AC
  Administered 2012-04-09: 4 mg via ORAL
  Filled 2012-04-09: qty 1

## 2012-04-09 MED ORDER — HYDROCODONE-ACETAMINOPHEN 5-325 MG PO TABS: ORAL_TABLET | ORAL | Status: DC

## 2012-04-09 MED ORDER — DIAZEPAM 5 MG PO TABS: ORAL_TABLET | ORAL | Status: DC

## 2012-04-09 MED ORDER — DIAZEPAM 5 MG PO TABS
5.0000 mg | ORAL_TABLET | Freq: Once | ORAL | Status: AC
  Administered 2012-04-09: 5 mg via ORAL
  Filled 2012-04-09: qty 1

## 2012-04-09 MED ORDER — HYDROCODONE-ACETAMINOPHEN 5-325 MG PO TABS
2.0000 | ORAL_TABLET | Freq: Once | ORAL | Status: AC
  Administered 2012-04-09: 2 via ORAL
  Filled 2012-04-09: qty 2

## 2012-04-09 NOTE — ED Notes (Signed)
Pt c/o chronic back pain that radiates down both legs, has appointment at the pain clinic at the end of the month, pt states that the pain is worse and that he is not able to wait until the end of the month, denies any new injury.

## 2012-04-09 NOTE — ED Provider Notes (Signed)
History     CSN: [id number]  Arrival date & time 04/09/12  1258   First MD Initiated Contact with Patient 04/09/12 1507      Chief Complaint  Patient presents with  . Back Pain    (Consider location/radiation/quality/duration/timing/severity/associated sxs/prior treatment) Patient is a 51 y.o. male presenting with back pain. The history is provided by the patient.  Back Pain  This is a chronic problem. The problem occurs daily. The problem has been gradually worsening. Associated with: Chronic pain following back surg. The pain is present in the lumbar spine. The quality of the pain is described as aching. The pain radiates to the left thigh and right thigh. The pain is moderate. The symptoms are aggravated by certain positions. The pain is worse during the day. Pertinent negatives include no chest pain, no abdominal pain, no bowel incontinence, no perianal numbness, no bladder incontinence and no dysuria.    Past Medical History  Diagnosis Date  . Anxiety   . Hypertension   . Diabetes mellitus   . Coronary artery disease   . Chronic back pain     Past Surgical History  Procedure Date  . Back surgery   . Appendectomy   . Tonsillectomy   . Cardiac stents     No family history on file.  History  Substance Use Topics  . Smoking status: Current Every Day Smoker -- 1.0 packs/day    Types: Cigarettes  . Smokeless tobacco: Not on file  . Alcohol Use: No      Review of Systems  Constitutional: Negative for activity change.       All ROS Neg except as noted in HPI  HENT: Negative for nosebleeds and neck pain.   Eyes: Negative for photophobia and discharge.  Respiratory: Negative for cough, shortness of breath and wheezing.   Cardiovascular: Negative for chest pain and palpitations.  Gastrointestinal: Negative for abdominal pain, blood in stool and bowel incontinence.  Genitourinary: Negative for bladder incontinence, dysuria, frequency and hematuria.  Musculoskeletal:  Positive for back pain. Negative for arthralgias.  Skin: Negative.   Neurological: Negative for dizziness, seizures and speech difficulty.  Psychiatric/Behavioral: Negative for hallucinations and confusion. The patient is nervous/anxious.     Allergies  Review of patient's allergies indicates no known allergies.  Home Medications   Current Outpatient Rx  Name Route Sig Dispense Refill  . ACETAMINOPHEN 500 MG PO TABS Oral Take 500-1,000 mg by mouth every 6 (six) hours as needed. for pain    . ASPIRIN EC 81 MG PO TBEC Oral Take 81 mg by mouth daily.    . OMEGA-3 FATTY ACIDS 1000 MG PO CAPS Oral Take 2 g by mouth daily.    Marland Kitchen GABAPENTIN 600 MG PO TABS Oral Take 600 mg by mouth 3 (three) times daily.    . IBUPROFEN 200 MG PO TABS Oral Take 400 mg by mouth every 6 (six) hours as needed. For pain    . METOPROLOL TARTRATE 100 MG PO TABS Oral Take 100 mg by mouth every morning.    . OXYCODONE-ACETAMINOPHEN 5-325 MG PO TABS Oral Take 1 tablet by mouth every 6 (six) hours as needed. Pain    . PRAVASTATIN SODIUM 40 MG PO TABS Oral Take 1 tablet (40 mg total) by mouth at bedtime. 30 tablet 1  . PSEUDOEPHEDRINE-GUAIFENESIN ER 60-600 MG PO TB12 Oral Take 1 tablet by mouth as needed. For congestion    . QUINAPRIL-HYDROCHLOROTHIAZIDE 20-25 MG PO TABS Oral Take 1 tablet by  mouth daily. 30 tablet 1    BP 163/87  Pulse 104  Temp 97.6 F (36.4 C)  Resp 20  Ht 6\' 3"  (1.905 m)  Wt 252 lb (114.306 kg)  BMI 31.50 kg/m2  SpO2 99%  Physical Exam  Nursing note and vitals reviewed. Constitutional: He is oriented to person, place, and time. He appears well-developed and well-nourished.  Non-toxic appearance.  HENT:  Head: Normocephalic.  Right Ear: Tympanic membrane and external ear normal.  Left Ear: Tympanic membrane and external ear normal.  Eyes: EOM and lids are normal. Pupils are equal, round, and reactive to light.  Neck: Normal range of motion. Neck supple. Carotid bruit is not present.    Cardiovascular: Normal rate, regular rhythm, normal heart sounds, intact distal pulses and normal pulses.   Pulmonary/Chest: Breath sounds normal. No respiratory distress.  Abdominal: Soft. Bowel sounds are normal. There is no tenderness. There is no guarding.  Musculoskeletal: Normal range of motion.       Pain to palpation of the lumbar area . Pain with changes of position. Pain with right and left straight leg raise.  Lymphadenopathy:       Head (right side): No submandibular adenopathy present.       Head (left side): No submandibular adenopathy present.    He has no cervical adenopathy.  Neurological: He is alert and oriented to person, place, and time. He has normal strength. No cranial nerve deficit or sensory deficit. He exhibits normal muscle tone. Coordination normal.  Skin: Skin is warm and dry.  Psychiatric: He has a normal mood and affect. His speech is normal.    ED Course  Procedures (including critical care time)  Labs Reviewed  GLUCOSE, CAPILLARY - Abnormal; Notable for the following:    Glucose-Capillary 365 (*)     All other components within normal limits   No results found. Pulse oximetry is 99% on room air. Within normal limits by my interpretation.  No diagnosis found.    MDM  I have reviewed nursing notes, vital signs, and all appropriate lab and imaging results for this patient. Patient unfortunately suffers from chronic back pain due to to known degenerative disc disease. The patient is scheduled to see a pain management specialist next week per the patient, however the patient states that his pain is just getting more and more severe and he does not feel he can stand until the time of his appointment. There has not been loss of bowel or bladder function. The plan at this time is for the patient to receive a prescription for Norco #15 tablets, Robaxin 500 mg.       Kathie Dike, [place] 04/09/12 1556

## 2012-04-09 NOTE — ED Notes (Addendum)
Pt bsl 365, pt states that he has been drinking a mountain dew. Pt advises to stop drinking the soda.

## 2012-04-10 NOTE — ED Provider Notes (Signed)
Medical screening examination/treatment/procedure(s) were performed by non-physician practitioner and as supervising physician I was immediately available for consultation/collaboration  Harrold Donath R. Rubin Payor, MD 04/10/12 2130

## 2012-06-21 ENCOUNTER — Encounter (HOSPITAL_COMMUNITY): Payer: Self-pay | Admitting: Emergency Medicine

## 2012-06-21 ENCOUNTER — Emergency Department (HOSPITAL_COMMUNITY)
Admission: EM | Admit: 2012-06-21 | Discharge: 2012-06-21 | Disposition: A | Discharge status: Home / Self Care | Payer: Self-pay | Attending: Emergency Medicine | Admitting: Emergency Medicine

## 2012-06-21 DIAGNOSIS — Z79899 Other long term (current) drug therapy: Secondary | ICD-10-CM | POA: Insufficient documentation

## 2012-06-21 DIAGNOSIS — I2510 Atherosclerotic heart disease of native coronary artery without angina pectoris: Secondary | ICD-10-CM | POA: Insufficient documentation

## 2012-06-21 DIAGNOSIS — F17200 Nicotine dependence, unspecified, uncomplicated: Secondary | ICD-10-CM | POA: Insufficient documentation

## 2012-06-21 DIAGNOSIS — M545 Low back pain, unspecified: Secondary | ICD-10-CM | POA: Insufficient documentation

## 2012-06-21 DIAGNOSIS — Z760 Encounter for issue of repeat prescription: Secondary | ICD-10-CM | POA: Insufficient documentation

## 2012-06-21 DIAGNOSIS — M549 Dorsalgia, unspecified: Secondary | ICD-10-CM

## 2012-06-21 DIAGNOSIS — F411 Generalized anxiety disorder: Secondary | ICD-10-CM | POA: Insufficient documentation

## 2012-06-21 DIAGNOSIS — M542 Cervicalgia: Secondary | ICD-10-CM | POA: Insufficient documentation

## 2012-06-21 DIAGNOSIS — Z7982 Long term (current) use of aspirin: Secondary | ICD-10-CM | POA: Insufficient documentation

## 2012-06-21 DIAGNOSIS — I10 Essential (primary) hypertension: Secondary | ICD-10-CM | POA: Insufficient documentation

## 2012-06-21 DIAGNOSIS — G8929 Other chronic pain: Secondary | ICD-10-CM | POA: Insufficient documentation

## 2012-06-21 DIAGNOSIS — E119 Type 2 diabetes mellitus without complications: Secondary | ICD-10-CM | POA: Insufficient documentation

## 2012-06-21 HISTORY — DX: Other chronic pain: G89.29

## 2012-06-21 HISTORY — DX: Cervicalgia: M54.2

## 2012-06-21 LAB — GLUCOSE, CAPILLARY: Glucose-Capillary: 341 mg/dL — ABNORMAL HIGH (ref 70–99)

## 2012-06-21 MED ORDER — OXYCODONE-ACETAMINOPHEN 5-325 MG PO TABS: ORAL_TABLET | ORAL | Status: DC

## 2012-06-21 MED ORDER — FENTANYL CITRATE 0.05 MG/ML IJ SOLN
100.0000 ug | Freq: Once | INTRAMUSCULAR | Status: AC
  Administered 2012-06-21: 100 ug via INTRAMUSCULAR
  Filled 2012-06-21: qty 2

## 2012-06-21 MED ORDER — METHOCARBAMOL 500 MG PO TABS: 1000.0000 mg | ORAL_TABLET | Freq: Four times a day (QID) | ORAL | Status: DC | PRN

## 2012-06-21 NOTE — ED Notes (Signed)
MD at bedside. 

## 2012-06-21 NOTE — ED Notes (Signed)
Discharge instructions reviewed with pt, questions answered. Pt verbalized understanding.  

## 2012-06-21 NOTE — ED Notes (Signed)
Pt c/o sciatica and c/o pain to legs. Out of percocet. nad

## 2012-06-21 NOTE — ED Provider Notes (Signed)
History     CSN: [id number]  Arrival date & time 06/21/12  1035   First MD Initiated Contact with Patient 06/21/12 1311      Chief Complaint  Patient presents with  . Back Pain  . Medication Refill    HPI Pt was seen at 1310.  Per pt, c/o gradual onset and persistence of constant acute flair of his chronic low back "pain" for the past several days.  Pain began after he ran out of his usual narcotic pain medication.  States he was eval at [facility] on 06/12/12 regarding his LBP, and is in the process of being referred to a "spine specialist" to be evaluated.  Denies any change in his usual chronic pain pattern.  Pain worsens with palpation of the area and body position changes. Denies incont/retention of bowel or bladder, no saddle anesthesia, no focal motor weakness, no tingling/numbness in extremities, no fevers, no injury, no abd pain.   The symptoms have been associated with no other complaints. The patient has a significant history of similar symptoms previously, recently being evaluated for this complaint and multiple prior evals for same.     Past Medical History  Diagnosis Date  . Anxiety   . Hypertension   . Diabetes mellitus   . Chronic back pain   . Chronic neck pain   . Coronary artery disease     Past Surgical History  Procedure Date  . Back surgery   . Appendectomy   . Tonsillectomy   . Cardiac stents     History  Substance Use Topics  . Smoking status: Current Every Day Smoker -- 1.0 packs/day    Types: Cigarettes  . Smokeless tobacco: Not on file  . Alcohol Use: No    Review of Systems ROS: Statement: All systems negative except as marked or noted in the HPI; Constitutional: Negative for fever and chills. ; ; Eyes: Negative for eye pain, redness and discharge. ; ; ENMT: Negative for ear pain, hoarseness, nasal congestion, sinus pressure and sore throat. ; ; Cardiovascular: Negative for chest pain, palpitations, diaphoresis, dyspnea and peripheral edema. ; ;  Respiratory: Negative for cough, wheezing and stridor. ; ; Gastrointestinal: Negative for nausea, vomiting, diarrhea, abdominal pain, blood in stool, hematemesis, jaundice and rectal bleeding. . ; ; Genitourinary: Negative for dysuria, flank pain and hematuria. ; ; Musculoskeletal: +LBP. Negative for neck pain. Negative for swelling and trauma.; ; Skin: Negative for pruritus, rash, abrasions, blisters, bruising and skin lesion.; ; Neuro: Negative for headache, lightheadedness and neck stiffness. Negative for weakness, altered level of consciousness , altered mental status, extremity weakness, paresthesias, involuntary movement, seizure and syncope.       Allergies  Review of patient's allergies indicates no known allergies.  Home Medications   Current Outpatient Rx  Name  Route  Sig  Dispense  Refill  . ACETAMINOPHEN 500 MG PO TABS   Oral   Take 500-1,000 mg by mouth every 6 (six) hours as needed. for pain         . ASPIRIN EC 81 MG PO TBEC   Oral   Take 81 mg by mouth daily.         Marland Kitchen CAPSAICIN 0.035 % EX CREA   Apply externally   Apply 1 application topically daily as needed. Pain         . CYCLOBENZAPRINE HCL 10 MG PO TABS   Oral   Take 10 mg by mouth 3 (three) times daily as needed. Muscle Spasms         .  OMEGA-3 FATTY ACIDS 1000 MG PO CAPS   Oral   Take 2 g by mouth daily.         Marland Kitchen GABAPENTIN 600 MG PO TABS   Oral   Take 600 mg by mouth 3 (three) times daily.         . IBUPROFEN 800 MG PO TABS   Oral   Take 800 mg by mouth every 8 (eight) hours as needed. Pain         . ICY HOT EXTRA STRENGTH EX   Apply externally   Apply 1 application topically daily as needed. Pain         . METOPROLOL TARTRATE 100 MG PO TABS   Oral   Take 100 mg by mouth every morning.         Marland Kitchen NAPROXEN SODIUM 220 MG PO TABS   Oral   Take 440 mg by mouth 2 (two) times daily as needed. Pain         . OXYCODONE-ACETAMINOPHEN 5-325 MG PO TABS   Oral   Take 1 tablet by  mouth every 4 (four) hours as needed. Pain         . PRAVASTATIN SODIUM 40 MG PO TABS   Oral   Take 1 tablet (40 mg total) by mouth at bedtime.   30 tablet   1   . PSEUDOEPHEDRINE-GUAIFENESIN ER 60-600 MG PO TB12   Oral   Take 1 tablet by mouth as needed. For congestion         . QUINAPRIL-HYDROCHLOROTHIAZIDE 20-25 MG PO TABS   Oral   Take 1 tablet by mouth daily.   30 tablet   1   . METHOCARBAMOL 500 MG PO TABS   Oral   Take 2 tablets (1,000 mg total) by mouth 4 (four) times daily as needed (muscle spasm/pain).   25 tablet   0   . OXYCODONE-ACETAMINOPHEN 5-325 MG PO TABS      1 or 2 tabs PO q6h prn pain   20 tablet   0     BP 150/92  Pulse 73  Temp 98.3 F (36.8 C) (Oral)  Resp 20  SpO2 100%  Physical Exam 1315: Physical examination:  Nursing notes reviewed; Vital signs and O2 SAT reviewed;  Constitutional: Well developed, Well nourished, Well hydrated, In no acute distress; Head:  Normocephalic, atraumatic; Eyes: EOMI, PERRL, No scleral icterus; ENMT: Mouth and pharynx normal, Mucous membranes moist; Neck: Supple, Full range of motion, No lymphadenopathy; Cardiovascular: Regular rate and rhythm, No murmur, rub, or gallop; Respiratory: Breath sounds clear & equal bilaterally, No rales, rhonchi, wheezes.  Speaking full sentences with ease, Normal respiratory effort/excursion; Chest: Nontender, Movement normal; Abdomen: Soft, Nontender, Nondistended, Normal bowel sounds; Genitourinary: No CVA tenderness; Spine:  No midline CS, TS, LS tenderness.  +TTP left and right lumbar paraspinal muscles;; Extremities: Pulses normal, No tenderness, No edema, No calf edema or asymmetry.; Neuro: AA&Ox3, Major CN grossly intact.  Speech clear. Strength 5/5 equal bilat UE's and LE's, including great toe dorsiflexion.  DTR 2/4 equal bilat UE's and LE's.  No gross sensory deficits.  Neg straight leg raises bilat.  Gait steady.;; Skin: Color normal, Warm, Dry.   ED Course  Procedures     MDM  MDM Reviewed: nursing note, vitals and previous chart Reviewed previous: MRI      1330:  Pt requesting "a shot" for his pain while in the ED, specifically dilaudid.  Long hx of chronic pain with multiple ED  visits for same.  Pt endorses acute flair of his usual long standing chronic pain today, no change from his usual chronic pain pattern.  Pt encouraged to f/u with his PMD and Pain Management doctor for good continuity of care and control of his chronic pain.  Verb understanding.       Laray Anger, DO 06/24/12 1249

## 2012-06-21 NOTE — ED Notes (Signed)
Pt awaiting MD assessment

## 2012-07-15 ENCOUNTER — Other Ambulatory Visit (HOSPITAL_COMMUNITY): Payer: Self-pay | Admitting: *Deleted

## 2012-07-15 DIAGNOSIS — IMO0002 Reserved for concepts with insufficient information to code with codable children: Secondary | ICD-10-CM

## 2012-07-15 DIAGNOSIS — M5137 Other intervertebral disc degeneration, lumbosacral region: Secondary | ICD-10-CM

## 2012-07-22 ENCOUNTER — Emergency Department (HOSPITAL_COMMUNITY): Payer: Self-pay

## 2012-07-22 ENCOUNTER — Observation Stay (HOSPITAL_COMMUNITY)
Admission: EM | Admit: 2012-07-22 | Discharge: 2012-07-23 | Disposition: A | Discharge status: Home / Self Care | Payer: Self-pay | Attending: Internal Medicine | Admitting: Internal Medicine

## 2012-07-22 ENCOUNTER — Encounter (HOSPITAL_COMMUNITY): Payer: Self-pay | Admitting: Emergency Medicine

## 2012-07-22 DIAGNOSIS — G8929 Other chronic pain: Secondary | ICD-10-CM

## 2012-07-22 DIAGNOSIS — R079 Chest pain, unspecified: Principal | ICD-10-CM | POA: Insufficient documentation

## 2012-07-22 DIAGNOSIS — F17200 Nicotine dependence, unspecified, uncomplicated: Secondary | ICD-10-CM | POA: Insufficient documentation

## 2012-07-22 DIAGNOSIS — I2510 Atherosclerotic heart disease of native coronary artery without angina pectoris: Secondary | ICD-10-CM | POA: Insufficient documentation

## 2012-07-22 DIAGNOSIS — K219 Gastro-esophageal reflux disease without esophagitis: Secondary | ICD-10-CM | POA: Diagnosis present

## 2012-07-22 DIAGNOSIS — I10 Essential (primary) hypertension: Secondary | ICD-10-CM | POA: Insufficient documentation

## 2012-07-22 DIAGNOSIS — M549 Dorsalgia, unspecified: Secondary | ICD-10-CM | POA: Insufficient documentation

## 2012-07-22 DIAGNOSIS — F410 Panic disorder [episodic paroxysmal anxiety] without agoraphobia: Secondary | ICD-10-CM | POA: Insufficient documentation

## 2012-07-22 DIAGNOSIS — Z720 Tobacco use: Secondary | ICD-10-CM | POA: Diagnosis present

## 2012-07-22 DIAGNOSIS — E119 Type 2 diabetes mellitus without complications: Secondary | ICD-10-CM | POA: Insufficient documentation

## 2012-07-22 DIAGNOSIS — E7800 Pure hypercholesterolemia, unspecified: Secondary | ICD-10-CM | POA: Insufficient documentation

## 2012-07-22 LAB — CBC WITH DIFFERENTIAL/PLATELET
Basophils Absolute: 0 10*3/uL (ref 0.0–0.1)
Basophils Relative: 1 % (ref 0–1)
Hemoglobin: 16.4 g/dL (ref 13.0–17.0)
MCHC: 36.9 g/dL — ABNORMAL HIGH (ref 30.0–36.0)
Monocytes Relative: 8 % (ref 3–12)
Neutro Abs: 4.2 10*3/uL (ref 1.7–7.7)
Neutrophils Relative %: 49 % (ref 43–77)
Platelets: 274 10*3/uL (ref 150–400)

## 2012-07-22 LAB — TROPONIN I
Troponin I: <0.3 ng/mL (ref <0.30)
Troponin I: <0.3 ng/mL (ref <0.30)

## 2012-07-22 LAB — LIPID PANEL
HDL: 28 mg/dL — ABNORMAL LOW (ref >39)
LDL Cholesterol: 130 mg/dL — ABNORMAL HIGH (ref 0–99)
VLDL: 52 mg/dL — ABNORMAL HIGH (ref 0–40)

## 2012-07-22 LAB — RAPID URINE DRUG SCREEN, HOSP PERFORMED
Barbiturates: NOT DETECTED
Benzodiazepines: NOT DETECTED
Cocaine: NOT DETECTED

## 2012-07-22 LAB — BASIC METABOLIC PANEL
BUN: 8 mg/dL (ref 6–23)
Chloride: 95 mEq/L — ABNORMAL LOW (ref 96–112)
GFR calc Af Amer: >90 mL/min (ref >90)
Potassium: 3.8 mEq/L (ref 3.5–5.1)
Sodium: 136 mEq/L (ref 135–145)

## 2012-07-22 LAB — GLUCOSE, CAPILLARY
Glucose-Capillary: 149 mg/dL — ABNORMAL HIGH (ref 70–99)
Glucose-Capillary: 131 mg/dL — ABNORMAL HIGH (ref 70–99)
Glucose-Capillary: 126 mg/dL — ABNORMAL HIGH (ref 70–99)

## 2012-07-22 MED ORDER — ALBUTEROL SULFATE (5 MG/ML) 0.5% IN NEBU: 2.5000 mg | INHALATION_SOLUTION | RESPIRATORY_TRACT | Status: DC | PRN

## 2012-07-22 MED ORDER — OXYCODONE-ACETAMINOPHEN 5-325 MG PO TABS
1.0000 | ORAL_TABLET | Freq: Four times a day (QID) | ORAL | Status: DC | PRN
  Administered 2012-07-22 – 2012-07-23 (×3): 1 via ORAL
  Filled 2012-07-22 (×4): qty 1

## 2012-07-22 MED ORDER — OMEGA-3-ACID ETHYL ESTERS 1 G PO CAPS
2.0000 g | ORAL_CAPSULE | Freq: Every day | ORAL | Status: DC
  Administered 2012-07-22 – 2012-07-23 (×2): 2 g via ORAL
  Filled 2012-07-22: qty 2
  Filled 2012-07-22 (×2): qty 1

## 2012-07-22 MED ORDER — CLONAZEPAM 0.5 MG PO TABS: 0.5000 mg | ORAL_TABLET | Freq: Two times a day (BID) | ORAL | Status: DC | PRN

## 2012-07-22 MED ORDER — NICOTINE 21 MG/24HR TD PT24
21.0000 mg | MEDICATED_PATCH | Freq: Every day | TRANSDERMAL | Status: DC
  Administered 2012-07-22 – 2012-07-23 (×2): 21 mg via TRANSDERMAL
  Filled 2012-07-22 (×2): qty 1

## 2012-07-22 MED ORDER — QUINAPRIL-HYDROCHLOROTHIAZIDE 20-25 MG PO TABS: 1.0000 | ORAL_TABLET | Freq: Every day | ORAL | Status: DC

## 2012-07-22 MED ORDER — INSULIN ASPART 100 UNIT/ML ~~LOC~~ SOLN
0.0000 [IU] | Freq: Three times a day (TID) | SUBCUTANEOUS | Status: DC
  Administered 2012-07-22: 2 [IU] via SUBCUTANEOUS
  Administered 2012-07-23: 5 [IU] via SUBCUTANEOUS

## 2012-07-22 MED ORDER — CYCLOBENZAPRINE HCL 10 MG PO TABS
10.0000 mg | ORAL_TABLET | Freq: Three times a day (TID) | ORAL | Status: DC | PRN
Start: 2012-07-22 — End: 2012-07-23
  Administered 2012-07-22: 10 mg via ORAL
  Filled 2012-07-22: qty 1

## 2012-07-22 MED ORDER — ASPIRIN EC 81 MG PO TBEC
81.0000 mg | DELAYED_RELEASE_TABLET | Freq: Every day | ORAL | Status: DC
  Administered 2012-07-23: 81 mg via ORAL
  Filled 2012-07-22: qty 1

## 2012-07-22 MED ORDER — PNEUMOCOCCAL VAC POLYVALENT 25 MCG/0.5ML IJ INJ
0.5000 mL | INJECTION | INTRAMUSCULAR | Status: AC
  Administered 2012-07-23: 0.5 mL via INTRAMUSCULAR
  Filled 2012-07-22 (×2): qty 0.5

## 2012-07-22 MED ORDER — SODIUM CHLORIDE 0.9 % IV SOLN
250.0000 mL | INTRAVENOUS | Status: DC | PRN
  Administered 2012-07-23: 250 mL via INTRAVENOUS

## 2012-07-22 MED ORDER — SIMVASTATIN 20 MG PO TABS
20.0000 mg | ORAL_TABLET | Freq: Every day | ORAL | Status: DC
  Administered 2012-07-22: 20 mg via ORAL
  Filled 2012-07-22 (×2): qty 1

## 2012-07-22 MED ORDER — LISINOPRIL 10 MG PO TABS
20.0000 mg | ORAL_TABLET | Freq: Every day | ORAL | Status: DC
  Administered 2012-07-23: 20 mg via ORAL
  Filled 2012-07-22: qty 2

## 2012-07-22 MED ORDER — HYDROCHLOROTHIAZIDE 25 MG PO TABS
25.0000 mg | ORAL_TABLET | Freq: Every day | ORAL | Status: DC
  Administered 2012-07-23: 25 mg via ORAL
  Filled 2012-07-22: qty 1

## 2012-07-22 MED ORDER — INSULIN ASPART 100 UNIT/ML ~~LOC~~ SOLN: 0.0000 [IU] | Freq: Every day | SUBCUTANEOUS | Status: DC

## 2012-07-22 MED ORDER — SENNA 8.6 MG PO TABS
1.0000 | ORAL_TABLET | Freq: Two times a day (BID) | ORAL | Status: DC
  Administered 2012-07-22 – 2012-07-23 (×2): 8.6 mg via ORAL
  Filled 2012-07-22 (×2): qty 1

## 2012-07-22 MED ORDER — ALUM & MAG HYDROXIDE-SIMETH 200-200-20 MG/5ML PO SUSP: 30.0000 mL | Freq: Four times a day (QID) | ORAL | Status: DC | PRN

## 2012-07-22 MED ORDER — SODIUM CHLORIDE 0.9 % IJ SOLN
3.0000 mL | Freq: Two times a day (BID) | INTRAMUSCULAR | Status: DC
  Administered 2012-07-22 – 2012-07-23 (×2): 3 mL via INTRAVENOUS

## 2012-07-22 MED ORDER — HYDROMORPHONE HCL PF 1 MG/ML IJ SOLN
0.5000 mg | INTRAMUSCULAR | Status: DC | PRN
  Administered 2012-07-22 – 2012-07-23 (×3): 0.5 mg via INTRAVENOUS
  Filled 2012-07-22 (×3): qty 1

## 2012-07-22 MED ORDER — CAPSAICIN 0.025 % EX CREA
TOPICAL_CREAM | Freq: Every day | CUTANEOUS | Status: DC | PRN
  Filled 2012-07-22: qty 56.6

## 2012-07-22 MED ORDER — CAPSICUM OLEORESIN 0.025 % EX CREA
TOPICAL_CREAM | CUTANEOUS | Status: AC
  Filled 2012-07-22: qty 60

## 2012-07-22 MED ORDER — ACETAMINOPHEN 325 MG PO TABS: 650.0000 mg | ORAL_TABLET | Freq: Four times a day (QID) | ORAL | Status: DC | PRN

## 2012-07-22 MED ORDER — CAPSAICIN 0.035 % EX CREA: 1.0000 "application " | TOPICAL_CREAM | Freq: Every day | CUTANEOUS | Status: DC | PRN

## 2012-07-22 MED ORDER — DOCUSATE SODIUM 100 MG PO CAPS
100.0000 mg | ORAL_CAPSULE | Freq: Two times a day (BID) | ORAL | Status: DC
  Administered 2012-07-22 – 2012-07-23 (×2): 100 mg via ORAL
  Filled 2012-07-22 (×2): qty 1

## 2012-07-22 MED ORDER — PNEUMOCOCCAL VAC POLYVALENT 25 MCG/0.5ML IJ INJ: 0.5000 mL | INJECTION | INTRAMUSCULAR | Status: DC

## 2012-07-22 MED ORDER — ENOXAPARIN SODIUM 40 MG/0.4ML ~~LOC~~ SOLN
40.0000 mg | SUBCUTANEOUS | Status: DC
  Administered 2012-07-22: 40 mg via SUBCUTANEOUS
  Filled 2012-07-22: qty 0.4

## 2012-07-22 MED ORDER — METFORMIN HCL 500 MG PO TABS
1000.0000 mg | ORAL_TABLET | Freq: Two times a day (BID) | ORAL | Status: DC
  Administered 2012-07-22 – 2012-07-23 (×2): 1000 mg via ORAL
  Filled 2012-07-22 (×2): qty 2

## 2012-07-22 MED ORDER — TRAZODONE HCL 50 MG PO TABS
50.0000 mg | ORAL_TABLET | Freq: Every evening | ORAL | Status: DC | PRN
  Administered 2012-07-22: 50 mg via ORAL
  Filled 2012-07-22: qty 1

## 2012-07-22 MED ORDER — PANTOPRAZOLE SODIUM 40 MG PO TBEC
40.0000 mg | DELAYED_RELEASE_TABLET | Freq: Every day | ORAL | Status: DC
  Administered 2012-07-23: 40 mg via ORAL
  Filled 2012-07-22: qty 1

## 2012-07-22 MED ORDER — INFLUENZA VIRUS VACC SPLIT PF IM SUSP
0.5000 mL | INTRAMUSCULAR | Status: AC
  Administered 2012-07-23: 0.5 mL via INTRAMUSCULAR
  Filled 2012-07-22 (×2): qty 0.5

## 2012-07-22 MED ORDER — SODIUM CHLORIDE 0.9 % IJ SOLN: 3.0000 mL | INTRAMUSCULAR | Status: DC | PRN

## 2012-07-22 MED ORDER — INFLUENZA VIRUS VACC SPLIT PF IM SUSP: 0.5000 mL | INTRAMUSCULAR | Status: DC

## 2012-07-22 MED ORDER — SODIUM CHLORIDE 0.9 % IJ SOLN
3.0000 mL | Freq: Two times a day (BID) | INTRAMUSCULAR | Status: DC
  Administered 2012-07-22: 3 mL via INTRAVENOUS

## 2012-07-22 MED ORDER — METOPROLOL TARTRATE 50 MG PO TABS
100.0000 mg | ORAL_TABLET | ORAL | Status: DC
  Administered 2012-07-23: 100 mg via ORAL
  Filled 2012-07-22 (×2): qty 2

## 2012-07-22 NOTE — ED Notes (Signed)
Cardiology in to see pt. Pt states he will stay now that he found out his med regimen will be followed here and he will get something for pain. Nad. No changes. No s/s of pain at this time. Aware awaiting for bed

## 2012-07-22 NOTE — Progress Notes (Signed)
Request for medical records signed by pt, faxed to [facility] and [facility].

## 2012-07-22 NOTE — ED Notes (Signed)
Water given. Dr Lendell Caprice at bedside with pt

## 2012-07-22 NOTE — H&P (Addendum)
Hospital Admission Note Date: 07/22/2012  Patient name: Clayton Foster Medical record number: [id number] Date of birth: May 22, 1961 Age: 51 y.o. Gender: male PCP: MUSE,ROCHELLE D., PA  Chief Complaint: Anxiety and chest pain  History of Present Illness:  Clayton Foster is an 51 y.o. male with a history of chronic back pain, and multiple visits to the emergency room for same. He presents today with chest pain, substernal. It first occurred 2 nights ago. He had visited his aunt in a nursing home and it made him very anxious. His heart started racing. He thought he might be having a panic attack, so he took a Clonopin and an aspirin. The pain eventually resolved. Patient reports that the anxiety, heart racing and pain returned today. He attributes it to "having a lot on his mind". He has been very stressed out about his chronic back pain. He again took aspirin and Clonopin. He has a history of coronary artery disease with stent. He thinks that he may have had a several stents placed at [facility] and [facility]. This occurred about 10 years ago. He's had no stress testing or cardiac catheterization since then. He also reports a history of gastroesophageal reflux disease and has been taking a lot of nonsteroidal anti-inflammatories or aspirin products for his chronic back pain. He is unemployed and living with his mother. The pain is essentially gone now in his chest, but he is requesting a pain "shot" for his back pain. He is very worried about his back pain and reports that a "shot" is the only thing that helps him. He continues to smoke a pack of cigarettes daily.  Past Medical History  Diagnosis Date  . Anxiety   . Hypertension   . Diabetes mellitus   . Chronic back pain   . Chronic neck pain   . Coronary artery disease     Meds: See med rec  Allergies: Review of patient's allergies indicates no known allergies. History   Social History  . Marital Status: Divorced   Spouse Name: N/A    Number of Children: N/A  . Years of Education: N/A   Occupational History  . Not on file.   Social History Main Topics  . Smoking status: Current Every Day Smoker -- 1.0 packs/day    Types: Cigarettes  . Smokeless tobacco: Not on file  . Alcohol Use: No  . Drug Use: No  . Sexually Active: Not on file   Other Topics Concern  . Not on file   Social History Narrative  . No narrative on file   History reviewed. No pertinent family history. Past Surgical History  Procedure Date  . Back surgery   . Appendectomy   . Tonsillectomy   . Cardiac stents     Review of Systems: Systems reviewed and as per HPI, otherwise negative.  Physical Exam: Blood pressure 124/93, pulse 78, temperature 98.2 F (36.8 C), temperature source Oral, resp. rate 20, SpO2 98.00%. BP 124/93  Pulse 78  Temp 98.2 F (36.8 C) (Oral)  Resp 20  SpO2 98%  General Appearance:    Alert, anxious appearing white male   Head:    Normocephalic, without obvious abnormality, atraumatic  Eyes:    PERRL, conjunctiva/corneas clear, EOM's intact, fundi    benign, both eyes       Ears:    Normal TM's and external ear canals, both ears  Nose:   Nares normal, septum midline, mucosa normal, no drainage    or sinus  tenderness  Throat:   Lips, mucosa, and tongue normal; teeth and gums normal  Neck:   Supple, symmetrical, trachea midline, no adenopathy;       thyroid:  No enlargement/tenderness/nodules; no carotid   bruit or JVD  Back:     Symmetric, no curvature, ROM normal, no CVA tenderness  Lungs:     Clear to auscultation bilaterally, respirations unlabored  Chest wall:    No tenderness or deformity  Heart:    Regular rate and rhythm, S1 and S2 normal, no murmur, rub   or gallop  Abdomen:     Soft, non-tender, bowel sounds active all four quadrants,    no masses, no organomegaly  Genitalia:   deferred   Rectal:   deferred  Extremities:   Extremities normal, atraumatic, no cyanosis or edema    Pulses:   2+ and symmetric all extremities  Skin:   Skin color, texture, turgor normal, no rashes or lesions  Lymph nodes:   Cervical, supraclavicular, and axillary nodes normal  Neurologic:   CNII-XII intact. Normal strength, sensation and reflexes      throughout    Psychiatric: Anxious appearing. Cooperative.  Lab results: Basic Metabolic Panel:  Basename 07/22/12 1257  NA 136  K 3.8  CL 95*  CO2 29  GLUCOSE 156*  BUN 8  CREATININE 0.78  CALCIUM 10.1  MG --  PHOS --   Liver Function Tests: No results found for this basename: AST:2,ALT:2,ALKPHOS:2,BILITOT:2,PROT:2,ALBUMIN:2 in the last 72 hours No results found for this basename: LIPASE:2,AMYLASE:2 in the last 72 hours No results found for this basename: AMMONIA:2 in the last 72 hours CBC:  Basename 07/22/12 1257  WBC 8.6  NEUTROABS 4.2  HGB 16.4  HCT 44.4  MCV 91.0  PLT 274   Cardiac Enzymes:  Basename 07/22/12 1257  CKTOTAL --  CKMB --  CKMBINDEX --  TROPONINI <0.30   BNP: No results found for this basename: PROBNP:3 in the last 72 hours D-Dimer: No results found for this basename: DDIMER:2 in the last 72 hours CBG:  Basename 07/22/12 1257  GLUCAP 149*   EKG shows normal sinus rhythm with right axis deviation  Imaging results:  Dg Chest 2 View  07/22/2012  *RADIOLOGY REPORT*  Clinical Data: Chest pain.  CHEST - 2 VIEW  Comparison: 07/22/2012  Findings: The opacity over the right first rib is believed to be due to rib end spurring.  Similar findings noted on the left.  Cardiac and mediastinal contours appear unremarkable.  The lungs appear clear.  No pleural effusion.  IMPRESSION:  1.  Sclerotic appearance the right first rib end is no longer asymmetric, and there is some asymmetric spurring along the first rib ends.   Original Report Authenticated By: Gaylyn Rong, M.D.    Dg Chest Portable 1 View  07/22/2012  *RADIOLOGY REPORT*  Clinical Data: Chest pain  PORTABLE CHEST - 1 VIEW  Comparison:  None.  Findings: Opacity at the anterior right first rib is either a bony finding or parenchymal density.  Upper normal heart size.  Lungs otherwise clear.  No pneumothorax.  IMPRESSION: Prominent opacity over the right first rib.  Upright PA and lateral chest study is recommended when feasible.   Original Report Authenticated By: Jolaine Click, M.D.     Assessment & Plan: Principal Problem:  *Chest pain, atypical. The patient however has not had any cardiac testing since his reported stents were placed 10 years ago. We'll get [facility] and [facility]. Placed  on observation on telemetry. Rule out MI. Consult cardiology to consider stress testing. Could also be panic related versus gastroesophageal reflux disease. Active Problems:  CAD (coronary artery disease)  Diabetes mellitus, type 2, uncontrolled. Patient reports that he takes metformin. We'll check hemoglobin A1c and resume.  Panic disorder, seems to be a major contributor here.  Hypertension  High cholesterol  Back pain with radiculopathy: Patient is very preoccupied about this. I emphasized that patient will be admitted for workup of his chest pain. I will give him IV opiates in small amounts to keep his pain at a reasonable level, but pain medication will not be accelerated, as this is a chronic problem. He voices understanding He reports that he has an appointment soon at [facility] to consider what sounds like steroid injections.  Tobacco use, counseled against.  GERD (gastroesophageal reflux disease): Will add proton pump inhibitor. He reports that he usually takes Zantac at home. He has been taking NSAIDs and aspirin products and this may be contributing to his chest pain.  Jenesys Casseus L 07/22/2012, 3:16 PM

## 2012-07-22 NOTE — ED Provider Notes (Signed)
History   This chart was scribed for [employer]. Rubin Payor, MD by Charolett Bumpers, ED Scribe. The patient was seen in room APA14/APA14. Patient's care was started at 1352.   CSN: [id number]  Arrival date & time 07/22/12  1247   First MD Initiated Contact with Patient 07/22/12 1352      Chief Complaint  Patient presents with  . Chest Pain    The history is provided by the patient. No language interpreter was used.  Clayton Foster is a 51 y.o. male who presents to the Emergency Department complaining of intermittent, mid sternal, severe chest pain for the past 2 days. He states that 2 days ago, he felt like his heart rate was elevated, took an aspirin and Clonazepam with moderate relief. He states that the symptoms returned today, took an aspirin and Clonazepam again with moderate relief. He states that he has a h/o daily panic attacks. He reports recently that he has felt anxious and it feels similar to his panic attacks. He denies any diaphoresis or SOB. He also complains of chronic back pain. He also has a h/o cardiac stents with the last placed in 2003. He states that he has not had any stress test or cardiac catheterizations since the stents were placed. He is a smoker.   PCP: [facility],  Not currently followed by a cardiologist  Past Medical History  Diagnosis Date  . Anxiety   . Hypertension   . Diabetes mellitus   . Chronic back pain   . Chronic neck pain   . Coronary artery disease     Past Surgical History  Procedure Date  . Back surgery   . Appendectomy   . Tonsillectomy   . Cardiac stents     History reviewed. No pertinent family history.  History  Substance Use Topics  . Smoking status: Current Every Day Smoker -- 1.0 packs/day    Types: Cigarettes  . Smokeless tobacco: Not on file  . Alcohol Use: No      Review of Systems  Constitutional: Negative for diaphoresis.  Respiratory: Negative for shortness of breath.     Cardiovascular: Positive for chest pain.  Musculoskeletal: Positive for back pain (chronic).  Psychiatric/Behavioral: The patient is nervous/anxious.   All other systems reviewed and are negative.    Allergies  Review of patient's allergies indicates no known allergies.  Home Medications   No current outpatient prescriptions on file.  BP 150/89  Pulse 67  Temp 97.8 F (36.6 C) (Oral)  Resp 20  Ht 6\' 3"  (1.905 m)  Wt 244 lb 7.8 oz (110.9 kg)  BMI 30.56 kg/m2  SpO2 99%  Physical Exam  Nursing note and vitals reviewed. Constitutional: He is oriented to person, place, and time. He appears well-developed and well-nourished. No distress.  HENT:  Head: Normocephalic and atraumatic.  Eyes: EOM are normal.  Neck: Neck supple. No tracheal deviation present.  Cardiovascular: Normal rate, regular rhythm and normal heart sounds.   No murmur heard. Pulmonary/Chest: Effort normal and breath sounds normal. No respiratory distress. He has no wheezes.  Abdominal: Soft. Bowel sounds are normal. He exhibits no distension. There is no tenderness.  Musculoskeletal: Normal range of motion.  Neurological: He is alert and oriented to person, place, and time.  Skin: Skin is warm and dry.  Psychiatric: He has a normal mood and affect. His behavior is normal.    ED Course  Procedures (including critical care time)  COORDINATION OF CARE:  14:00-Discussed planned course of treatment with the patient including reviewing old records and cardiac workup, who is agreeable at this time.   Results for orders placed during the hospital encounter of 07/22/12  CBC WITH DIFFERENTIAL      Component Value Range   WBC 8.6  4.0 - 10.5 K/uL   RBC 4.88  4.22 - 5.81 MIL/uL   Hemoglobin 16.4  13.0 - 17.0 g/dL   HCT 96.0  45.4 - 09.8 %   MCV 91.0  78.0 - 100.0 fL   MCH 33.6  26.0 - 34.0 pg   MCHC 36.9 (*) 30.0 - 36.0 g/dL   RDW 11.9  14.7 - 82.9 %   Platelets 274  150 - 400 K/uL   Neutrophils Relative 49   43 - 77 %   Neutro Abs 4.2  1.7 - 7.7 K/uL   Lymphocytes Relative 40  12 - 46 %   Lymphs Abs 3.4  0.7 - 4.0 K/uL   Monocytes Relative 8  3 - 12 %   Monocytes Absolute 0.7  0.1 - 1.0 K/uL   Eosinophils Relative 3  0 - 5 %   Eosinophils Absolute 0.2  0.0 - 0.7 K/uL   Basophils Relative 1  0 - 1 %   Basophils Absolute 0.0  0.0 - 0.1 K/uL  BASIC METABOLIC PANEL      Component Value Range   Sodium 136  135 - 145 mEq/L   Potassium 3.8  3.5 - 5.1 mEq/L   Chloride 95 (*) 96 - 112 mEq/L   CO2 29  19 - 32 mEq/L   Glucose, Bld 156 (*) 70 - 99 mg/dL   BUN 8  6 - 23 mg/dL   Creatinine, Ser 5.62  0.50 - 1.35 mg/dL   Calcium 13.0  8.4 - 86.5 mg/dL   GFR calc non Af Amer >90  >90 mL/min   GFR calc Af Amer >90  >90 mL/min  TROPONIN I      Component Value Range   Troponin I <0.30  <0.30 ng/mL  GLUCOSE, CAPILLARY      Component Value Range   Glucose-Capillary 149 (*) 70 - 99 mg/dL  URINE RAPID DRUG SCREEN (HOSP PERFORMED)      Component Value Range   Opiates NONE DETECTED  NONE DETECTED   Cocaine NONE DETECTED  NONE DETECTED   Benzodiazepines NONE DETECTED  NONE DETECTED   Amphetamines NONE DETECTED  NONE DETECTED   Tetrahydrocannabinol NONE DETECTED  NONE DETECTED   Barbiturates NONE DETECTED  NONE DETECTED  TROPONIN I      Component Value Range   Troponin I <0.30  <0.30 ng/mL  GLUCOSE, CAPILLARY      Component Value Range   Glucose-Capillary 131 (*) 70 - 99 mg/dL   Comment 1 Notify RN     Comment 2 Documented in Chart      Dg Chest 2 View  07/22/2012  *RADIOLOGY REPORT*  Clinical Data: Chest pain.  CHEST - 2 VIEW  Comparison: 07/22/2012  Findings: The opacity over the right first rib is believed to be due to rib end spurring.  Similar findings noted on the left.  Cardiac and mediastinal contours appear unremarkable.  The lungs appear clear.  No pleural effusion.  IMPRESSION:  1.  Sclerotic appearance the right first rib end is no longer asymmetric, and there is some asymmetric  spurring along the first rib ends.   Original Report Authenticated By: Gaylyn Rong, M.D.    Dg Chest  Portable 1 View  07/22/2012  *RADIOLOGY REPORT*  Clinical Data: Chest pain  PORTABLE CHEST - 1 VIEW  Comparison: None.  Findings: Opacity at the anterior right first rib is either a bony finding or parenchymal density.  Upper normal heart size.  Lungs otherwise clear.  No pneumothorax.  IMPRESSION: Prominent opacity over the right first rib.  Upright PA and lateral chest study is recommended when feasible.   Original Report Authenticated By: Jolaine Click, M.D.      1. Chest pain   2. Chronic back pain   3. CAD (coronary artery disease)   4. Diabetes mellitus, type 2   5. Panic disorder   6. Tobacco use   7. High cholesterol   8. Hypertension     Date: 07/22/2012  Rate: 82  Rhythm: normal sinus rhythm  QRS Axis: right  Intervals: normal  ST/T Wave abnormalities: normal  Conduction Disutrbances:none  Narrative Interpretation:   Old EKG Reviewed: none available     MDM  Patient presents with chest pain. He said previous history of coronary disease. Last stent was 10 years ago. He cannot tell me if this pain feels like that pain. No EKG changes and troponin is negative. Patient be admitted for further evaluation. He also complaining of chronic back pain.  I personally performed the services described in this documentation, which was scribed in my presence. The recorded information has been reviewed and is accurate.       Juliet Rude. Rubin Payor, MD 07/22/12 1958

## 2012-07-22 NOTE — Consult Note (Signed)
CARDIOLOGY CONSULT NOTE  Patient ID: Clayton Foster MRN: [id number] DOB/AGE: Apr 10, 1961 51 y.o.  Admit date: 07/22/2012 Referring Physician: PTH Darnelle Spangle MD Primary PhysicianMUSE,ROCHELLE D., PA Primary Cardiologist: Nolon Stalls, [place] Reason for Consultation: Chest Pain with known history of CAD and stents Principal Problem:  *Chest pain Active Problems:  Diabetes mellitus, type 2  Hypertension  High cholesterol  Back pain with radiculopathy  CAD (coronary artery disease)  Panic disorder  Tobacco use  GERD (gastroesophageal reflux disease)  HPI: [name] is an anxious 51 y/o patient who is normally followed in [place], [place] by Drs. Earna Coder and Hyacinth Meeker, for CAD, and history of stent placement to unknown arteries. He has been lost to follow up since 2003. He also has a history of hypertension, hyperlipidemia, anxiety, chronic back pain, GERD,and diabetes. He states that he had some substernal chest discomfort described as pressure and burning after visiting his great aunt in a SNF. This upset him to see her. He initially thought the discomfort was an anxiety attack as he states this is what he experiences during those type of attacks. He took an ASA, clonopin and experienced relief. Today, he woke up and experienced recurrent chest discomfort and burning with elevated BP at home. He came to ER for further evaluation as he was concerned that the symptoms were related to his cardiac disease.   He admits to medical noncompliance. He states he has chronic back pain that keeps in the bed for days at a time, and he sometimes does not feel like taking his medications. He also has no insurance, and see the physicians at the [facility] where he is not always assisted with his medications. He is due for an MRI of his back sometime this week.  He refuses admittance until he can receive something for pain. He unfortunately continues to smoke.   In ER, patient's BP 134/85, HR 80, Temp  98.2.Troponin <0.30. K+ 3.8. Glucose 156. He was not found to be anemic. CXR was remarkable for a prominent opacity of the right first rib, no acute cardiopulmonary findings. EKG shows no acute ACS.      Review of systems complete and found to be negative unless listed above   Past Medical History  Diagnosis Date  . Anxiety   . Hypertension   . Diabetes mellitus   . Chronic back pain   . Chronic neck pain   . Coronary artery disease     Family History Mother-Heart Murmur Father-Hypertension  History   Social History  . Marital Status: Divorced    Spouse Name: N/A    Number of Children: N/A  . Years of Education: N/A   Occupational History  . Unemployed    Social History Main Topics  . Smoking status: Current Every Day Smoker -- 1.0 packs/day    Types: Cigarettes  . Smokeless tobacco: Not on file  . Alcohol Use: No  . Drug Use: No  . Sexually Active: Not on file   Other Topics Concern  . Not on file   Social History Narrative  . No narrative on file    Past Surgical History  Procedure Date  . Back surgery   . Appendectomy   . Tonsillectomy   . Cardiac stents     Physical Exam: Blood pressure 124/93, pulse 78, temperature 98.2 F (36.8 C), temperature source Oral, resp. rate 20, SpO2 98.00%.   Current Facility-Administered Medications  Medication Dose Route Frequency Provider Last Rate Last Dose  . 0.9 %  sodium chloride infusion  250 mL Intravenous PRN Christiane Ha, MD      . acetaminophen (TYLENOL) tablet 650 mg  650 mg Oral Q6H PRN Christiane Ha, MD      . albuterol (PROVENTIL) (5 MG/ML) 0.5% nebulizer solution 2.5 mg  2.5 mg Nebulization Q2H PRN Christiane Ha, MD      . alum & mag hydroxide-simeth (MAALOX/MYLANTA) 200-200-20 MG/5ML suspension 30 mL  30 mL Oral Q6H PRN Christiane Ha, MD      . aspirin EC tablet 81 mg  81 mg Oral Daily Christiane Ha, MD      . Capsaicin 0.035 % CREA 1 application  1 application Apply externally  Daily PRN Christiane Ha, MD      . clonazePAM Scarlette Calico) tablet 0.5 mg  0.5 mg Oral BID PRN Christiane Ha, MD      . cyclobenzaprine (FLEXERIL) tablet 10 mg  10 mg Oral TID PRN Christiane Ha, MD   10 mg at 07/22/12 1704  . docusate sodium (COLACE) capsule 100 mg  100 mg Oral BID Christiane Ha, MD      . enoxaparin (LOVENOX) injection 40 mg  40 mg Subcutaneous Q24H Christiane Ha, MD   40 mg at 07/22/12 1703  . lisinopril (PRINIVIL,ZESTRIL) tablet 20 mg  20 mg Oral Daily Christiane Ha, MD       And  . hydrochlorothiazide (HYDRODIURIL) tablet 25 mg  25 mg Oral Daily Christiane Ha, MD      . HYDROmorphone (DILAUDID) injection 0.5 mg  0.5 mg Intravenous Q3H PRN Christiane Ha, MD      . influenza  inactive virus vaccine (FLUZONE/FLUARIX) injection 0.5 mL  0.5 mL Intramuscular Tomorrow-1000 Christiane Ha, MD      . insulin aspart (novoLOG) injection 0-15 Units  0-15 Units Subcutaneous TID WC Christiane Ha, MD   2 Units at 07/22/12 1717  . insulin aspart (novoLOG) injection 0-5 Units  0-5 Units Subcutaneous QHS Christiane Ha, MD      . metFORMIN (GLUCOPHAGE) tablet 1,000 mg  1,000 mg Oral BID WC Christiane Ha, MD   1,000 mg at 07/22/12 1704  . metoprolol (LOPRESSOR) tablet 100 mg  100 mg Oral BH-q7a Christiane Ha, MD      . nicotine (NICODERM CQ - dosed in mg/24 hours) patch 21 mg  21 mg Transdermal Daily Christiane Ha, MD   21 mg at 07/22/12 1704  . omega-3 acid ethyl esters (LOVAZA) capsule 2 g  2 g Oral Daily Christiane Ha, MD   2 g at 07/22/12 1717  . oxyCODONE-acetaminophen (PERCOCET/ROXICET) 5-325 MG per tablet 1 tablet  1 tablet Oral Q6H PRN Christiane Ha, MD   1 tablet at 07/22/12 1624  . pantoprazole (PROTONIX) EC tablet 40 mg  40 mg Oral Daily Christiane Ha, MD      . pneumococcal 23 valent vaccine (PNU-IMMUNE) injection 0.5 mL  0.5 mL Intramuscular Tomorrow-1000 Christiane Ha, MD      . senna [facility])  tablet 8.6 mg  1 tablet Oral BID Christiane Ha, MD      . simvastatin (ZOCOR) tablet 20 mg  20 mg Oral q1800 Christiane Ha, MD   20 mg at 07/22/12 1718  . sodium chloride 0.9 % injection 3 mL  3 mL Intravenous Q12H Christiane Ha, MD      . sodium chloride 0.9 % injection 3 mL  3 mL Intravenous Q12H Christiane Ha, MD      . sodium chloride 0.9 % injection 3 mL  3 mL Intravenous PRN Christiane Ha, MD      . traZODone (DESYREL) tablet 50 mg  50 mg Oral QHS PRN Christiane Ha, MD       General: Well developed, well nourished, in no acute distress Head: Eyes PERRLA, No xanthomas.   Normal cephalic and atramatic  Lungs: Clear bilaterally to auscultation and percussion. Heart: HRRR S1 S2, without MRG.  Pulses are 2+ & equal.            No carotid bruit. No JVD.  No abdominal bruits. No femoral bruits. Abdomen: Bowel sounds are positive, abdomen soft and non-tender without masses or                  Hernia's noted. Msk:  Back normal, normal gait. Normal strength and tone for age. Extremities: No clubbing, cyanosis or edema.  DP +1 Neuro: Alert and oriented X 3. Psych:  Good affect, responds appropriately, anxious.  Labs: Lab Results  Component Value Date   WBC 8.6 07/22/2012   HGB 16.4 07/22/2012   HCT 44.4 07/22/2012   MCV 91.0 07/22/2012   PLT 274 07/22/2012     Lab 07/22/12 1257  NA 136  K 3.8  CL 95*  CO2 29  BUN 8  CREATININE 0.78  CALCIUM 10.1  PROT --  BILITOT --  ALKPHOS --  ALT --  AST --  GLUCOSE 156*   Lab Results  Component Value Date   TROPONINI <0.30 07/22/2012     Radiology: Dg Chest 2 View  07/22/2012  *RADIOLOGY REPORT*  Clinical Data: Chest pain.  CHEST - 2 VIEW  Comparison: 07/22/2012  Findings: The opacity over the right first rib is believed to be due to rib end spurring.  Similar findings noted on the left.  Cardiac and mediastinal contours appear unremarkable.  The lungs appear clear.  No pleural effusion.  IMPRESSION:  1.   Sclerotic appearance the right first rib end is no longer asymmetric, and there is some asymmetric spurring along the first rib ends.   Original Report Authenticated By: Gaylyn Rong, M.D.    Dg Chest Portable 1 View  07/22/2012  *RADIOLOGY REPORT*  Clinical Data: Chest pain  PORTABLE CHEST - 1 VIEW  Comparison: None.  Findings: Opacity at the anterior right first rib is either a bony finding or parenchymal density.  Upper normal heart size.  Lungs otherwise clear.  No pneumothorax.  IMPRESSION: Prominent opacity over the right first rib.  Upright PA and lateral chest study is recommended when feasible.   Original Report Authenticated By: Jolaine Click, M.D.    EKG:NSR rate of 82 bpm  ASSESSMENT AND PLAN:   1. Chest Pain: Typical and atypical features associated with anxiety.  He states that symptoms prior to stent placement were dyspnea, only. He states that the discomfort he felt prior to coming in is usually related to his anxiety. Initial cardiac enzymes are normal. EKG is negative for ACS. Records are being requested concerning stent placement. Add NTG prn for chest pain. Cycle cardiac enzymes.                  2. CAD: States he has 3 stents to unknown arteries. He does not carry any identification of stent placement locations. Sees Dr.Zachary and Miller in [place]. Records have been requested. With known history, will plan to have  lexiscan myoview in am. He will be held NPO after midnight. Continue ASA,metoprolol, statin.  3.  Hypertension: Currently well controlled. Continue lisinopril/HCTZ 20/25 mg daily.   4. Chronic Back Pain: Consider drug seeking behavior as he refused admission unless he gets medications for pain. He has MRI scheduled sometime this week.  4. Diabetes: Home medications do not have diabetic meds listed. I will check a Hgb A1C  5. Chronic Anxiety 6. Medical noncompliance 7. Ongoing tobacco abuse  Signed: Bettey Mare. Lyman Bishop NP [facility] 07/22/2012,  3:59 PM  Cardiology Attending Patient interviewed and examined. Discussed with Joni Reining, NP.  Above note annotated and modified based upon my findings.  Mr. Huge has done remarkably well since multiple interventions approximately a decade ago by his history and despite continued cigarette smoking. He reports no exertional symptoms, but activity is quite limited due to chronic lumbosacral spine problems.  EKG shows only minimal T-wave abnormalities. We will proceed with a pharmacologic stress nuclear study to exclude significant obstructive coronary disease.  Lipid profile will be assessed and medication adjusted if necessary.  Blood pressure has been mildly elevated intermittently in the past and during this admission. He may require slightly enhanced antihypertensive therapy as well.  Furman Bing, MD 07/22/2012, 6:03 PM

## 2012-07-22 NOTE — ED Notes (Signed)
Floor unable to take report at this time.

## 2012-07-22 NOTE — ED Notes (Signed)
Pt c/o pressure to center of chest intermittant x 2 days. Pt states " i have anxiety attacks" and states cp gets worse when he gets anxious. States feels anxious right now. Denies sob/n/v/d/dizziness/cough/congestion. Nondiaphoretic. nad at this time.

## 2012-07-23 ENCOUNTER — Observation Stay (HOSPITAL_COMMUNITY): Payer: Self-pay

## 2012-07-23 ENCOUNTER — Encounter (HOSPITAL_COMMUNITY): Payer: Self-pay

## 2012-07-23 ENCOUNTER — Encounter (HOSPITAL_COMMUNITY): Payer: Self-pay | Admitting: Cardiology

## 2012-07-23 DIAGNOSIS — K219 Gastro-esophageal reflux disease without esophagitis: Secondary | ICD-10-CM

## 2012-07-23 LAB — GLUCOSE, CAPILLARY

## 2012-07-23 MED ORDER — REGADENOSON 0.4 MG/5ML IV SOLN
INTRAVENOUS | Status: AC
  Administered 2012-07-23: 0.4 mg via INTRAVENOUS
  Filled 2012-07-23: qty 5

## 2012-07-23 MED ORDER — ATORVASTATIN CALCIUM 40 MG PO TABS: 40.0000 mg | ORAL_TABLET | Freq: Every day | ORAL | Status: DC

## 2012-07-23 MED ORDER — TECHNETIUM TC 99M SESTAMIBI - CARDIOLITE
30.0000 | Freq: Once | INTRAVENOUS | Status: AC | PRN
  Administered 2012-07-23: 29.6 via INTRAVENOUS

## 2012-07-23 MED ORDER — SODIUM CHLORIDE 0.9 % IJ SOLN
INTRAMUSCULAR | Status: AC
  Administered 2012-07-23: 10 mL via INTRAVENOUS
  Filled 2012-07-23: qty 10

## 2012-07-23 MED ORDER — NITROGLYCERIN 0.4 MG SL SUBL: 0.4000 mg | SUBLINGUAL_TABLET | SUBLINGUAL | Status: DC | PRN

## 2012-07-23 MED ORDER — METFORMIN HCL 1000 MG PO TABS: 1000.0000 mg | ORAL_TABLET | Freq: Two times a day (BID) | ORAL | Status: DC

## 2012-07-23 MED ORDER — PANTOPRAZOLE SODIUM 40 MG PO TBEC: 40.0000 mg | DELAYED_RELEASE_TABLET | Freq: Every day | ORAL | Status: DC

## 2012-07-23 MED ORDER — TECHNETIUM TC 99M SESTAMIBI - CARDIOLITE
10.0000 | Freq: Once | INTRAVENOUS | Status: AC | PRN
  Administered 2012-07-23: 07:00:00 9.5 via INTRAVENOUS

## 2012-07-23 NOTE — Progress Notes (Signed)
UR Chart Review Completed  

## 2012-07-23 NOTE — Discharge Summary (Signed)
Physician Discharge Summary  Clayton Foster ZOX:[id number] DOB: 05/10/1961 DOA: 07/22/2012  PCP: Kizzie Furnish D., PA  Admit date: 07/22/2012 Discharge date: 07/23/2012  Time spent: 35 minutes  Recommendations for Outpatient Follow-up:  1. Follow up with primary care doctor in 2 weeks 2. Follow up with cardiology, [place] in 2 weeks  Discharge Diagnoses:  Principal Problem:  *Chest pain Active Problems:  Diabetes mellitus, type 2  Hypertension  High cholesterol  Back pain with radiculopathy  CAD (coronary artery disease)  Panic disorder  Tobacco use  GERD (gastroesophageal reflux disease)   Discharge Condition: stable  Diet recommendation: low salt, low carb  Filed Weights   07/22/12 1659  Weight: 110.9 kg (244 lb 7.8 oz)    History of present illness:  Clayton Foster is an 51 y.o. male with a history of chronic back pain, and multiple visits to the emergency room for same. He presents today with chest pain, substernal. It first occurred 2 nights ago. He had visited his aunt in a nursing home and it made him very anxious. His heart started racing. He thought he might be having a panic attack, so he took a Clonopin and an aspirin. The pain eventually resolved. Patient reports that the anxiety, heart racing and pain returned today. He attributes it to "having a lot on his mind". He has been very stressed out about his chronic back pain. He again took aspirin and Clonopin. He has a history of coronary artery disease with stent. He thinks that he may have had a several stents placed at [facility] and [facility]. This occurred about 10 years ago. He's had no stress testing or cardiac catheterization since then. He also reports a history of gastroesophageal reflux disease and has been taking a lot of nonsteroidal anti-inflammatories or aspirin products for his chronic back pain. He is unemployed and living with his mother. The pain is essentially gone now in his chest,  but he is requesting a pain "shot" for his back pain. He is very worried about his back pain and reports that a "shot" is the only thing that helps him. He continues to smoke a pack of cigarettes daily.    Hospital Course:  Patient was admitted to the hospital with chest pain and chronic low back pain. He has multiple risk factors including hypertension, diabetes, cigarette smoking, hyperlipidemia. He was seen by the cardiology service and underwent stress test. This was reported as a low to moderate risk study. Recommendations from cardiology included changing statin to Lipitor, given the patient a prescription for sublingual nitroglycerin to use when necessary, as well as outpatient cardiology followup. Patient no longer sees a cardiologist and [place] and request to be followed up with [facility] cardiology. Patient reports that his chest pain has resolved since admission. Patient takes a fair amount of nonsteroidal anti-inflammatories due to pain. It is possible that his pain maybe related to acid reflux. He'll be started on a proton pump inhibitor.  Regarding his chronic low back pain, he reports being scheduled for an MRI this week and will followup with a spine surgeon at [facility] next Monday.   Remainder of his medical issues remained stable.  Procedures:  Stress test, low to mod risk study  Consultations:  Cardiology, [place]  Discharge Exam: Filed Vitals:   07/22/12 1659 07/22/12 2128 07/23/12 0513 07/23/12 1123  BP: 150/89 163/89 113/74 131/92  Pulse: 67 104 102 108  Temp: 97.8 F (36.6 C) 97.7 F (36.5 C) 97.2 F (36.2  C)   TempSrc: Oral Oral Oral   Resp: 20 19 18    Height: 6\' 3"  (1.905 m)     Weight: 110.9 kg (244 lb 7.8 oz)     SpO2: 99% 98% 98%     General: NAD, uncomfortable due to back pain Cardiovascular: s1, s2, rrr Respiratory: cta b  Discharge Instructions  Discharge Orders    Future Appointments: Provider: Department: Dept Phone: Center:   07/26/2012  10:00 AM [facility] [phone] [place] [facility]     Future Orders Please Complete By Expires   Diet - low sodium heart healthy      Increase activity slowly      Call MD for:  severe uncontrolled pain      Call MD for:  difficulty breathing, headache or visual disturbances      Call MD for:  temperature >100.4          Medication List     As of 07/23/2012  1:47 PM    STOP taking these medications         pravastatin 40 MG tablet   Commonly known as: PRAVACHOL      TAKE these medications         ACETAMINOPHEN EXTRA STRENGTH 500 MG tablet   Generic drug: acetaminophen   Take 500-1,000 mg by mouth every 6 (six) hours as needed. for pain      aspirin EC 81 MG tablet   Take 81 mg by mouth daily.      atorvastatin 40 MG tablet   Commonly known as: LIPITOR   Take 1 tablet (40 mg total) by mouth daily.      Capsaicin 0.035 % Crea   Apply 1 application topically daily as needed. Pain      clonazePAM 0.5 MG tablet   Commonly known as: KLONOPIN   Take 0.5 mg by mouth 2 (two) times daily as needed. Anxiety      cyclobenzaprine 10 MG tablet   Commonly known as: FLEXERIL   Take 10 mg by mouth 3 (three) times daily as needed. Muscle Spasms      fish oil-omega-3 fatty acids 1000 MG capsule   Take 2 g by mouth daily.      ibuprofen 800 MG tablet   Commonly known as: ADVIL,MOTRIN   Take 800 mg by mouth every 8 (eight) hours as needed. Pain      ICY HOT EXTRA STRENGTH EX   Apply 1 application topically daily as needed. Pain      metFORMIN 1000 MG tablet   Commonly known as: GLUCOPHAGE   Take 1 tablet (1,000 mg total) by mouth 2 (two) times daily with a meal.      metoprolol 100 MG tablet   Commonly known as: LOPRESSOR   Take 100 mg by mouth every morning.      nitroGLYCERIN 0.4 MG SL tablet   Commonly known as: NITROSTAT   Place 1 tablet (0.4 mg total) under the tongue every 5 (five) minutes as needed for chest pain.      oxyCODONE-acetaminophen 5-325 MG per  tablet   Commonly known as: PERCOCET/ROXICET   Take 1 tablet by mouth every 4 (four) hours as needed. Pain      pantoprazole 40 MG tablet   Commonly known as: PROTONIX   Take 1 tablet (40 mg total) by mouth daily.      pseudoephedrine-guaifenesin 60-600 MG per tablet   Commonly known as: MUCINEX D   Take  1 tablet by mouth as needed. For congestion      quinapril-hydrochlorothiazide 20-25 MG per tablet   Commonly known as: ACCURETIC   Take 1 tablet by mouth daily.           Follow-up Information    Follow up with MUSE,ROCHELLE D., PA. In 2 weeks.   Contact information:   [address] [place] [address] [phone]       Follow up with Stamford Bing, MD. Schedule an appointment as soon as possible for a visit in 2 weeks.   Contact information:   [address] [place] [address] [phone]           The results of significant diagnostics from this hospitalization (including imaging, microbiology, ancillary and laboratory) are listed below for reference.    Significant Diagnostic Studies: Dg Chest 2 View  07/22/2012  *RADIOLOGY REPORT*  Clinical Data: Chest pain.  CHEST - 2 VIEW  Comparison: 07/22/2012  Findings: The opacity over the right first rib is believed to be due to rib end spurring.  Similar findings noted on the left.  Cardiac and mediastinal contours appear unremarkable.  The lungs appear clear.  No pleural effusion.  IMPRESSION:  1.  Sclerotic appearance the right first rib end is no longer asymmetric, and there is some asymmetric spurring along the first rib ends.   Original Report Authenticated By: Gaylyn Rong, M.D.    Dg Chest Portable 1 View  07/22/2012  *RADIOLOGY REPORT*  Clinical Data: Chest pain  PORTABLE CHEST - 1 VIEW  Comparison: None.  Findings: Opacity at the anterior right first rib is either a bony finding or parenchymal density.  Upper normal heart size.  Lungs otherwise clear.  No pneumothorax.  IMPRESSION: Prominent opacity over  the right first rib.  Upright PA and lateral chest study is recommended when feasible.   Original Report Authenticated By: Jolaine Click, M.D.     Microbiology: No results found for this or any previous visit (from the past 240 hour(s)).   Labs: Basic Metabolic Panel:  Lab 07/22/12 5284  NA 136  K 3.8  CL 95*  CO2 29  GLUCOSE 156*  BUN 8  CREATININE 0.78  CALCIUM 10.1  MG --  PHOS --   Liver Function Tests: No results found for this basename: AST:5,ALT:5,ALKPHOS:5,BILITOT:5,PROT:5,ALBUMIN:5 in the last 168 hours No results found for this basename: LIPASE:5,AMYLASE:5 in the last 168 hours No results found for this basename: AMMONIA:5 in the last 168 hours CBC:  Lab 07/22/12 1257  WBC 8.6  NEUTROABS 4.2  HGB 16.4  HCT 44.4  MCV 91.0  PLT 274   Cardiac Enzymes:  Lab 07/22/12 2212 07/22/12 1533 07/22/12 1257  CKTOTAL -- -- --  CKMB -- -- --  CKMBINDEX -- -- --  TROPONINI <0.30 <0.30 <0.30   BNP: BNP (last 3 results) No results found for this basename: PROBNP:3 in the last 8760 hours CBG:  Lab 07/23/12 1145 07/23/12 0751 07/22/12 2039 07/22/12 1657 07/22/12 1257  GLUCAP 205* 152* 126* 131* 149*       Signed:  Mayjor Ager  [facility] 07/23/2012, 1:47 PM

## 2012-07-23 NOTE — Progress Notes (Signed)
Ludwin W Nappier  51 y.o.  male  Subjective: Denies further chest pain or dyspnea.  Allergy: Review of patient's allergies indicates no known allergies.  Objective: Vital signs in last 24 hours: Temp:  [97.2 F (36.2 C)-97.7 F (36.5 C)] 97.2 F (36.2 C) (12/31 0513) Pulse Rate:  [102-108] 108  (12/31 1123) Resp:  [18-19] 18  (12/31 0513) BP: (113-163)/(74-92) 131/92 mmHg (12/31 1123) SpO2:  [98 %] 98 % (12/31 0513)  110.9 kg (244 lb 7.8 oz) Body mass index is 30.56 kg/(m^2).  Weight change:  Last BM Date: 07/22/12  Intake/Output from previous day: 12/30 0701 - 12/31 0700 In: 360 [P.O.:360] Out: -   General- Well developed; no acute distress Neck- No JVD, no carotid bruits Lungs- clear lung fields; normal I:E ratio Cardiovascular- normal PMI; normal S1 and S2 Abdomen- normal bowel sounds; soft and non-tender without masses or organomegaly Skin- Warm, no significant lesions Extremities- Nl distal pulses; no edema  Lab Results: Cardiac Markers:   Basename 07/22/12 2212 07/22/12 1533  TROPONINI <0.30 <0.30   CBC:   Basename 07/22/12 1257  WBC 8.6  HGB 16.4  HCT 44.4  PLT 274   BMET:  Basename 07/22/12 1257  NA 136  K 3.8  CL 95*  CO2 29  GLUCOSE 156*  BUN 8  CREATININE 0.78  CALCIUM 10.1   Lab Results  Component Value Date   CHOL 210* 07/22/2012   HDL 28* 07/22/2012   LDLCALC 130* 07/22/2012   TRIG 259* 07/22/2012   CHOLHDL 7.5 07/22/2012   07/23/2012  Myoview pharmacologic stress    IMPRESSION: Abnormal pharmacologic stress nuclear myocardial study revealing no stress induced EKG abnormalities, normal left ventricular size and normal left ventricular systolic function.  By scintigraphic imaging, there was a small area of apparent mild inferior ischemia, perhaps with a component of infarction.  The possibility that motion artifact, variable diaphragmatic attenuation and/or incorrect normalization contributed to these abnormalities must be considered.   Overall, this is a low to moderate risk study with a single small perfusion defect.  Other findings as noted.   Original Report Authenticated By: [place] Bing     Principal Problem:  *Chest pain Active Problems:  Diabetes mellitus, type 2  Hypertension  High cholesterol  Back pain with radiculopathy  CAD (coronary artery disease)  Panic disorder  Tobacco use  GERD (gastroesophageal reflux disease)   Assessment/Plan: Coronary artery disease: Stress nuclear study is mildly abnormal demonstrating a small area of inferior ischemia and/or infarction versus artifact.  This is relatively low risk study, and patient can be discharged.  He should have sublingual nitroglycerin at home.  Hyperlipidemia: Lipid profile is suboptimal.  Atorvastatin 40 mg per day will be substituted for simvastatin.  Hypertension: blood pressure control is improved and can be further monitored as an outpatient.  We would be happy to continue to follow this nice gentleman as an outpatient.  Otherwise, he can return to the care of Dr. Earna Coder.   He should be reassessed in approximately one week.  LOS: 1 day   Moore Bing 07/23/2012, 6:24 PM

## 2012-07-23 NOTE — Progress Notes (Signed)
Stress Lab Nurses Notes - Sumner Kirchman 07/23/2012 Reason for doing test: CAD and Chest Pain Type of test: [name] / Inpatient Rm 318 Nurse performing test: Parke Poisson, RN Nuclear Medicine Tech: Lyndel Pleasure Echo Tech: Not Applicable MD performing test: R. Rothbart & Joni Reining NP  Family MD: [facility] Test explained and consent signed: yes IV started: 20g jelco, Saline lock flushed, No redness or edema and Saline lock from floor Symptoms: "Felt strange all over" Treatment/Intervention: None Reason test stopped: protocol completed After recovery IV was: No redness or edema Patient to return to Nuc. Med at : 10:00 Patient discharged: Transported back to room 318 via wc Patient's Condition upon discharge was: stable Comments: During test BP 129/70 & HR 130.  Recovery BP 103/72 & HR 118.  Symptom resolved in recovery. Erskine Speed T

## 2012-07-23 NOTE — Progress Notes (Signed)
Patient still in nuclear medicine.  Per nursing, mainly complaining of leg pain and requesting more pain medication. MI has been ruled out. Patient would likely benefit from gabapentin or Lyrica for his neuropathic pain. I question whether it is in fact radiculopathy. Could be diabetic peripheral neuropathy. I will be at [facility] for the rest of the day rounding. Dr. Kerry Hough will evaluate the patient today. If Myoview shows no ischemia, patient may benefit from switching to a proton pump inhibitor rather than H2 blocker. Reportedly has a history of GERD.

## 2012-07-23 NOTE — Care Management Note (Signed)
    Page 1 of 1   07/23/2012     1:29:13 PM   CARE MANAGEMENT NOTE 07/23/2012  Patient:  Clayton Foster, Clayton Foster   Account Number:  [id number]  Date Initiated:  07/23/2012  Documentation initiated by:  Rosemary Holms  Subjective/Objective Assessment:   Pt admitted for chest pain. Pt c/o extreme leg pain. RCM notified MD. lives with his mother     Action/Plan:   No HH needs identified   Anticipated DC Date:  07/23/2012   Anticipated DC Plan:  HOME/SELF CARE      DC Planning Services  CM consult      Choice offered to / List presented to:             Status of service:  Completed, signed off Medicare Important Message given?   (If response is "NO", the following Medicare IM given date fields will be blank) Date Medicare IM given:   Date Additional Medicare IM given:    Discharge Disposition:    Per UR Regulation:    If discussed at Long Length of Stay Meetings, dates discussed:    Comments:  07/23/12 Rosemary Holms RN BSN CM

## 2012-07-23 NOTE — Progress Notes (Signed)
Discharge instructions given, verbalized understanding, out in stable condition ambulatory with staff. 

## 2012-07-26 ENCOUNTER — Ambulatory Visit (HOSPITAL_COMMUNITY)
Admission: RE | Admit: 2012-07-26 | Discharge: 2012-07-26 | Disposition: A | Discharge status: Home / Self Care | Payer: Self-pay | Source: Ambulatory Visit | Attending: *Deleted | Admitting: *Deleted

## 2012-07-26 DIAGNOSIS — IMO0002 Reserved for concepts with insufficient information to code with codable children: Secondary | ICD-10-CM

## 2012-07-26 DIAGNOSIS — M5137 Other intervertebral disc degeneration, lumbosacral region: Secondary | ICD-10-CM

## 2012-07-26 DIAGNOSIS — M545 Low back pain, unspecified: Secondary | ICD-10-CM | POA: Insufficient documentation

## 2012-07-26 DIAGNOSIS — M25559 Pain in unspecified hip: Secondary | ICD-10-CM | POA: Insufficient documentation

## 2012-07-26 DIAGNOSIS — M5126 Other intervertebral disc displacement, lumbar region: Secondary | ICD-10-CM | POA: Insufficient documentation

## 2012-07-26 MED ORDER — GADOBENATE DIMEGLUMINE 529 MG/ML IV SOLN
20.0000 mL | Freq: Once | INTRAVENOUS | Status: AC | PRN
  Administered 2012-07-26: 20 mL via INTRAVENOUS

## 2012-10-10 ENCOUNTER — Emergency Department (HOSPITAL_COMMUNITY): Payer: Medicaid Other

## 2012-10-10 ENCOUNTER — Encounter (HOSPITAL_COMMUNITY): Payer: Self-pay | Admitting: *Deleted

## 2012-10-10 ENCOUNTER — Inpatient Hospital Stay (HOSPITAL_COMMUNITY)
Admission: EM | Admit: 2012-10-10 | Discharge: 2012-10-15 | DRG: 247 | Disposition: A | Discharge status: Home / Self Care | Payer: Medicaid Other | Attending: Cardiology | Admitting: Cardiology

## 2012-10-10 DIAGNOSIS — Y831 Surgical operation with implant of artificial internal device as the cause of abnormal reaction of the patient, or of later complication, without mention of misadventure at the time of the procedure: Secondary | ICD-10-CM | POA: Diagnosis present

## 2012-10-10 DIAGNOSIS — F410 Panic disorder [episodic paroxysmal anxiety] without agoraphobia: Secondary | ICD-10-CM | POA: Diagnosis present

## 2012-10-10 DIAGNOSIS — Z9861 Coronary angioplasty status: Secondary | ICD-10-CM

## 2012-10-10 DIAGNOSIS — E7800 Pure hypercholesterolemia, unspecified: Secondary | ICD-10-CM

## 2012-10-10 DIAGNOSIS — Z9089 Acquired absence of other organs: Secondary | ICD-10-CM

## 2012-10-10 DIAGNOSIS — E669 Obesity, unspecified: Secondary | ICD-10-CM | POA: Diagnosis present

## 2012-10-10 DIAGNOSIS — F17200 Nicotine dependence, unspecified, uncomplicated: Secondary | ICD-10-CM | POA: Diagnosis present

## 2012-10-10 DIAGNOSIS — IMO0002 Reserved for concepts with insufficient information to code with codable children: Secondary | ICD-10-CM | POA: Diagnosis present

## 2012-10-10 DIAGNOSIS — E785 Hyperlipidemia, unspecified: Secondary | ICD-10-CM | POA: Diagnosis present

## 2012-10-10 DIAGNOSIS — Z7982 Long term (current) use of aspirin: Secondary | ICD-10-CM

## 2012-10-10 DIAGNOSIS — Z6830 Body mass index (BMI) 30.0-30.9, adult: Secondary | ICD-10-CM

## 2012-10-10 DIAGNOSIS — I10 Essential (primary) hypertension: Secondary | ICD-10-CM | POA: Diagnosis present

## 2012-10-10 DIAGNOSIS — I200 Unstable angina: Secondary | ICD-10-CM | POA: Diagnosis present

## 2012-10-10 DIAGNOSIS — E119 Type 2 diabetes mellitus without complications: Secondary | ICD-10-CM | POA: Diagnosis present

## 2012-10-10 DIAGNOSIS — R079 Chest pain, unspecified: Secondary | ICD-10-CM

## 2012-10-10 DIAGNOSIS — K219 Gastro-esophageal reflux disease without esophagitis: Secondary | ICD-10-CM | POA: Diagnosis present

## 2012-10-10 DIAGNOSIS — Y92009 Unspecified place in unspecified non-institutional (private) residence as the place of occurrence of the external cause: Secondary | ICD-10-CM

## 2012-10-10 DIAGNOSIS — Z720 Tobacco use: Secondary | ICD-10-CM

## 2012-10-10 DIAGNOSIS — F411 Generalized anxiety disorder: Secondary | ICD-10-CM | POA: Diagnosis present

## 2012-10-10 DIAGNOSIS — G8929 Other chronic pain: Secondary | ICD-10-CM | POA: Diagnosis present

## 2012-10-10 DIAGNOSIS — Z79899 Other long term (current) drug therapy: Secondary | ICD-10-CM

## 2012-10-10 DIAGNOSIS — I2510 Atherosclerotic heart disease of native coronary artery without angina pectoris: Principal | ICD-10-CM | POA: Diagnosis present

## 2012-10-10 DIAGNOSIS — E871 Hypo-osmolality and hyponatremia: Secondary | ICD-10-CM | POA: Diagnosis present

## 2012-10-10 DIAGNOSIS — T82897A Other specified complication of cardiac prosthetic devices, implants and grafts, initial encounter: Secondary | ICD-10-CM | POA: Diagnosis present

## 2012-10-10 HISTORY — DX: Hyperlipidemia, unspecified: E78.5

## 2012-10-10 LAB — CBC WITH DIFFERENTIAL/PLATELET
Basophils Absolute: 0.1 10*3/uL (ref 0.0–0.1)
Eosinophils Relative: 5 % (ref 0–5)
HCT: 37.7 % — ABNORMAL LOW (ref 39.0–52.0)
Lymphocytes Relative: 39 % (ref 12–46)
MCHC: 37.9 g/dL — ABNORMAL HIGH (ref 30.0–36.0)
MCV: 88.9 fL (ref 78.0–100.0)
Monocytes Absolute: 0.9 10*3/uL (ref 0.1–1.0)
RDW: 12 % (ref 11.5–15.5)

## 2012-10-10 LAB — COMPREHENSIVE METABOLIC PANEL
AST: 29 U/L (ref 0–37)
BUN: 8 mg/dL (ref 6–23)
CO2: 28 mEq/L (ref 19–32)
Calcium: 9.8 mg/dL (ref 8.4–10.5)
Creatinine, Ser: 0.92 mg/dL (ref 0.50–1.35)
GFR calc Af Amer: >90 mL/min (ref >90)
GFR calc non Af Amer: >90 mL/min (ref >90)

## 2012-10-10 LAB — TROPONIN I: Troponin I: <0.3 ng/mL (ref <0.30)

## 2012-10-10 LAB — GLUCOSE, CAPILLARY: Glucose-Capillary: 133 mg/dL — ABNORMAL HIGH (ref 70–99)

## 2012-10-10 LAB — LIPID PANEL
HDL: 27 mg/dL — ABNORMAL LOW (ref >39)
Triglycerides: 173 mg/dL — ABNORMAL HIGH (ref <150)

## 2012-10-10 MED ORDER — SIMVASTATIN 10 MG PO TABS
5.0000 mg | ORAL_TABLET | Freq: Every day | ORAL | Status: DC
  Administered 2012-10-10 – 2012-10-14 (×5): 5 mg via ORAL
  Filled 2012-10-10 (×4): qty 0.5
  Filled 2012-10-10: qty 1
  Filled 2012-10-10 (×2): qty 0.5
  Filled 2012-10-10: qty 1
  Filled 2012-10-10: qty 0.5

## 2012-10-10 MED ORDER — NITROGLYCERIN 2 % TD OINT
0.5000 [in_us] | TOPICAL_OINTMENT | Freq: Four times a day (QID) | TRANSDERMAL | Status: DC
  Administered 2012-10-10 – 2012-10-14 (×13): 0.5 [in_us] via TOPICAL
  Filled 2012-10-10: qty 1
  Filled 2012-10-10: qty 30
  Filled 2012-10-10 (×2): qty 1

## 2012-10-10 MED ORDER — LISINOPRIL 10 MG PO TABS
20.0000 mg | ORAL_TABLET | Freq: Every day | ORAL | Status: DC
  Filled 2012-10-10: qty 2

## 2012-10-10 MED ORDER — ENOXAPARIN SODIUM 40 MG/0.4ML ~~LOC~~ SOLN
40.0000 mg | SUBCUTANEOUS | Status: DC
  Administered 2012-10-10: 40 mg via SUBCUTANEOUS
  Filled 2012-10-10 (×2): qty 0.4

## 2012-10-10 MED ORDER — HYDROMORPHONE HCL PF 1 MG/ML IJ SOLN
0.5000 mg | INTRAMUSCULAR | Status: DC | PRN
  Administered 2012-10-10 – 2012-10-14 (×18): 1 mg via INTRAVENOUS
  Filled 2012-10-10 (×17): qty 1

## 2012-10-10 MED ORDER — SODIUM CHLORIDE 0.9 % IV SOLN
INTRAVENOUS | Status: AC
  Administered 2012-10-10: 17:00:00 via INTRAVENOUS

## 2012-10-10 MED ORDER — SODIUM CHLORIDE 0.9 % IJ SOLN
3.0000 mL | Freq: Two times a day (BID) | INTRAMUSCULAR | Status: DC
  Administered 2012-10-11 – 2012-10-13 (×3): 3 mL via INTRAVENOUS

## 2012-10-10 MED ORDER — MORPHINE SULFATE 2 MG/ML IJ SOLN
1.0000 mg | INTRAMUSCULAR | Status: DC | PRN
  Administered 2012-10-10 (×3): 1 mg via INTRAVENOUS
  Filled 2012-10-10 (×3): qty 1

## 2012-10-10 MED ORDER — CLONAZEPAM 0.5 MG PO TABS
0.5000 mg | ORAL_TABLET | Freq: Every day | ORAL | Status: DC | PRN
  Administered 2012-10-10 – 2012-10-14 (×4): 0.5 mg via ORAL
  Filled 2012-10-10 (×4): qty 1

## 2012-10-10 MED ORDER — HYDROCHLOROTHIAZIDE 25 MG PO TABS
25.0000 mg | ORAL_TABLET | Freq: Every day | ORAL | Status: DC
  Filled 2012-10-10: qty 1

## 2012-10-10 MED ORDER — FAMOTIDINE 10 MG PO TABS
10.0000 mg | ORAL_TABLET | Freq: Two times a day (BID) | ORAL | Status: DC
  Administered 2012-10-10 – 2012-10-15 (×11): 10 mg via ORAL
  Filled 2012-10-10 (×13): qty 1

## 2012-10-10 MED ORDER — OXYCODONE-ACETAMINOPHEN 5-325 MG PO TABS
1.0000 | ORAL_TABLET | ORAL | Status: DC | PRN
  Administered 2012-10-10 – 2012-10-15 (×11): 1 via ORAL
  Filled 2012-10-10 (×11): qty 1

## 2012-10-10 MED ORDER — ONDANSETRON HCL 4 MG/2ML IJ SOLN
4.0000 mg | Freq: Once | INTRAMUSCULAR | Status: AC
  Administered 2012-10-10: 4 mg via INTRAVENOUS
  Filled 2012-10-10: qty 2

## 2012-10-10 MED ORDER — HYDROMORPHONE HCL PF 1 MG/ML IJ SOLN
1.0000 mg | Freq: Once | INTRAMUSCULAR | Status: AC
  Administered 2012-10-10: 1 mg via INTRAVENOUS
  Filled 2012-10-10: qty 1

## 2012-10-10 MED ORDER — CYCLOBENZAPRINE HCL 10 MG PO TABS
10.0000 mg | ORAL_TABLET | Freq: Three times a day (TID) | ORAL | Status: DC | PRN
  Administered 2012-10-10 – 2012-10-14 (×7): 10 mg via ORAL
  Filled 2012-10-10 (×8): qty 1

## 2012-10-10 MED ORDER — NITROGLYCERIN 0.4 MG SL SUBL: 0.4000 mg | SUBLINGUAL_TABLET | SUBLINGUAL | Status: DC | PRN

## 2012-10-10 MED ORDER — METOPROLOL TARTRATE 50 MG PO TABS
100.0000 mg | ORAL_TABLET | ORAL | Status: DC
Start: 2012-10-11 — End: 2012-10-11
  Filled 2012-10-10: qty 2

## 2012-10-10 MED ORDER — ASPIRIN EC 81 MG PO TBEC
81.0000 mg | DELAYED_RELEASE_TABLET | Freq: Every day | ORAL | Status: DC
  Administered 2012-10-10 – 2012-10-12 (×3): 81 mg via ORAL
  Filled 2012-10-10 (×5): qty 1

## 2012-10-10 MED ORDER — METFORMIN HCL 500 MG PO TABS
1000.0000 mg | ORAL_TABLET | Freq: Two times a day (BID) | ORAL | Status: DC
  Administered 2012-10-10: 1000 mg via ORAL
  Filled 2012-10-10 (×5): qty 2

## 2012-10-10 MED ORDER — QUINAPRIL-HYDROCHLOROTHIAZIDE 20-25 MG PO TABS: 1.0000 | ORAL_TABLET | Freq: Every morning | ORAL | Status: DC

## 2012-10-10 MED ORDER — NICOTINE 21 MG/24HR TD PT24
21.0000 mg | MEDICATED_PATCH | Freq: Every day | TRANSDERMAL | Status: DC
  Administered 2012-10-10 – 2012-10-15 (×6): 21 mg via TRANSDERMAL
  Filled 2012-10-10 (×6): qty 1

## 2012-10-10 NOTE — ED Notes (Signed)
Pt states he has been under a lot of stress lately. Intermittent CP and left arm pain since Friday night.

## 2012-10-10 NOTE — ED Provider Notes (Signed)
History     CSN: [id number]  Arrival date & time 10/10/12  0918   First MD Initiated Contact with Patient 10/10/12 [phone]      Chief Complaint  Patient presents with  . Anxiety  . Pleurisy    (Consider location/radiation/quality/duration/timing/severity/associated sxs/prior treatment) Patient is a 52 y.o. male presenting with anxiety. The history is provided by the patient.  Anxiety This is a new problem. The current episode started in the past 7 days. The problem occurs intermittently. The problem has been unchanged. Associated symptoms include chest pain. Pertinent negatives include no abdominal pain, chills, congestion, coughing, diaphoresis, fever, headaches, myalgias, nausea, neck pain, numbness, rash, sore throat, vomiting or weakness. Nothing aggravates the symptoms.   Clayton Foster presents to the ED with chest pain and tingling in his left arm. He states that 6 days ago he had the first symptom. He went to bed that night and woke approximately 2:30 am with chest pain and numbness in the left arm. The chest pain was located in the mid sternal area. He described it as squeezing pain and burning. He also experienced numbness on the left side of his face. He got up and thought it was one of his panic attacks, although it had been a long time since he had one. He calmed himself down and was ok. He thought it may also be due to the chronic back pain he has due to a bulging disc so he also took pain medication to control that. He is evaluated at [facility] for that. Then 3 additional nights he had the same episodes and once again got up calmed down and symptoms went away. After having another episode this morning at 6 am he decided to come in for evaluation. He was evaluated here in December 2013 and had evaluation with Cardiac non exercise stress test which was normal. His PCP is at [facility] and he has a Development worker, international aid. Has not followed up with the Cardiologist yet.   Past  Medical History  Diagnosis Date  . Anxiety   . Hypertension   . Diabetes mellitus   . Chronic back pain   . Chronic neck pain   . Coronary artery disease     Past Surgical History  Procedure Laterality Date  . Back surgery    . Appendectomy    . Tonsillectomy    . Cardiac stents      No family history on file.  History  Substance Use Topics  . Smoking status: Current Every Day Smoker -- 1.00 packs/day    Types: Cigarettes  . Smokeless tobacco: Not on file  . Alcohol Use: No      Review of Systems  Constitutional: Negative for fever, chills and diaphoresis.  HENT: Negative for ear pain, congestion, sore throat, facial swelling, neck pain, neck stiffness, dental problem and sinus pressure.   Eyes: Negative for photophobia, pain and visual disturbance.  Respiratory: Positive for chest tightness. Negative for cough and wheezing.   Cardiovascular: Positive for chest pain. Negative for palpitations and leg swelling.  Gastrointestinal: Negative for nausea, vomiting, abdominal pain, diarrhea, constipation and abdominal distention.  Genitourinary: Negative for dysuria, frequency, flank pain and difficulty urinating.  Musculoskeletal: Negative for myalgias, back pain and gait problem.  Skin: Negative for color change and rash.  Allergic/Immunologic: Negative for environmental allergies and food allergies.  Neurological: Negative for dizziness, syncope, speech difficulty, weakness, light-headedness, numbness and headaches.  Psychiatric/Behavioral: Positive for sleep disturbance. Negative for confusion and agitation.  The patient is nervous/anxious.     Allergies  Review of patient's allergies indicates no known allergies.  Home Medications   Current Outpatient Rx  Name  Route  Sig  Dispense  Refill  . acetaminophen (ACETAMINOPHEN EXTRA STRENGTH) 500 MG tablet   Oral   Take 500-1,000 mg by mouth every 6 (six) hours as needed. for pain         . aspirin EC 81 MG tablet    Oral   Take 81 mg by mouth daily.         Marland Kitchen atorvastatin (LIPITOR) 40 MG tablet   Oral   Take 1 tablet (40 mg total) by mouth daily.   30 tablet   1   . Capsaicin 0.035 % CREA   Apply externally   Apply 1 application topically daily as needed. Pain         . clonazePAM (KLONOPIN) 0.5 MG tablet   Oral   Take 0.5 mg by mouth 2 (two) times daily as needed. Anxiety         . cyclobenzaprine (FLEXERIL) 10 MG tablet   Oral   Take 10 mg by mouth 3 (three) times daily as needed. Muscle Spasms         . fish oil-omega-3 fatty acids 1000 MG capsule   Oral   Take 2 g by mouth daily.         Marland Kitchen ibuprofen (ADVIL,MOTRIN) 800 MG tablet   Oral   Take 800 mg by mouth every 8 (eight) hours as needed. Pain         . Menthol-Methyl Salicylate (ICY HOT EXTRA STRENGTH EX)   Apply externally   Apply 1 application topically daily as needed. Pain         . metFORMIN (GLUCOPHAGE) 1000 MG tablet   Oral   Take 1 tablet (1,000 mg total) by mouth 2 (two) times daily with a meal.   60 tablet   1   . metoprolol (LOPRESSOR) 100 MG tablet   Oral   Take 100 mg by mouth every morning.         . nitroGLYCERIN (NITROSTAT) 0.4 MG SL tablet   Sublingual   Place 1 tablet (0.4 mg total) under the tongue every 5 (five) minutes as needed for chest pain.   30 tablet   12   . oxyCODONE-acetaminophen (PERCOCET/ROXICET) 5-325 MG per tablet   Oral   Take 1 tablet by mouth every 4 (four) hours as needed. Pain         . pantoprazole (PROTONIX) 40 MG tablet   Oral   Take 1 tablet (40 mg total) by mouth daily.   30 tablet   1   . pseudoephedrine-guaifenesin (MUCINEX D) 60-600 MG per tablet   Oral   Take 1 tablet by mouth as needed. For congestion         . quinapril-hydrochlorothiazide (ACCURETIC) 20-25 MG per tablet   Oral   Take 1 tablet by mouth daily.   30 tablet   1     BP 128/73  Temp(Src) 98.7 F (37.1 C) (Oral)  Resp 16  Ht 6\' 3"  (1.905 m)  Wt 244 lb (110.678 kg)   BMI 30.5 kg/m2  SpO2 100%  Physical Exam  Nursing note and vitals reviewed. Constitutional: He is oriented to person, place, and time. No distress.  Obese W/M  HENT:  Head: Normocephalic and atraumatic.  Eyes: EOM are normal. Pupils are equal, round, and reactive to light.  Neck: Normal range of motion. Neck supple.  Cardiovascular: Normal rate, regular rhythm and normal heart sounds.   Pulmonary/Chest: Effort normal and breath sounds normal.  Abdominal: Soft. Bowel sounds are normal. There is no tenderness.  Musculoskeletal: He exhibits no edema.  Decreased range of motion lower back. Pain with palpation lumbar spine and with range of motion.   Neurological: He is alert and oriented to person, place, and time. He has normal strength and normal reflexes. No cranial nerve deficit or sensory deficit.  Pedal pulses present and equal bilateral. Good touch sensation, adequate circulation. No edema noted.  Skin: Skin is warm and dry.  Psychiatric: He has a normal mood and affect. His behavior is normal. Judgment and thought content normal.   Results for orders placed during the hospital encounter of 10/10/12 (from the past 24 hour(s))  CBC WITH DIFFERENTIAL     Status: Abnormal   Collection Time    10/10/12 10:36 AM      Result Value Range   WBC 9.2  4.0 - 10.5 K/uL   RBC 4.24  4.22 - 5.81 MIL/uL   Hemoglobin 14.3  13.0 - 17.0 g/dL   HCT 16.1 (*) 09.6 - 04.5 %   MCV 88.9  78.0 - 100.0 fL   MCH 33.7  26.0 - 34.0 pg   MCHC 37.9 (*) 30.0 - 36.0 g/dL   RDW 40.9  81.1 - 91.4 %   Platelets 253  150 - 400 K/uL   Neutrophils Relative 47  43 - 77 %   Neutro Abs 4.3  1.7 - 7.7 K/uL   Lymphocytes Relative 39  12 - 46 %   Lymphs Abs 3.5  0.7 - 4.0 K/uL   Monocytes Relative 9  3 - 12 %   Monocytes Absolute 0.9  0.1 - 1.0 K/uL   Eosinophils Relative 5  0 - 5 %   Eosinophils Absolute 0.4  0.0 - 0.7 K/uL   Basophils Relative 1  0 - 1 %   Basophils Absolute 0.1  0.0 - 0.1 K/uL  COMPREHENSIVE  METABOLIC PANEL     Status: Abnormal   Collection Time    10/10/12 10:36 AM      Result Value Range   Sodium 131 (*) 135 - 145 mEq/L   Potassium 3.9  3.5 - 5.1 mEq/L   Chloride 94 (*) 96 - 112 mEq/L   CO2 28  19 - 32 mEq/L   Glucose, Bld 251 (*) 70 - 99 mg/dL   BUN 8  6 - 23 mg/dL   Creatinine, Ser 7.82  0.50 - 1.35 mg/dL   Calcium 9.8  8.4 - 95.6 mg/dL   Total Protein 7.5  6.0 - 8.3 g/dL   Albumin 4.0  3.5 - 5.2 g/dL   AST 29  0 - 37 U/L   ALT 42  0 - 53 U/L   Alkaline Phosphatase 88  39 - 117 U/L   Total Bilirubin 0.3  0.3 - 1.2 mg/dL   GFR calc non Af Amer >90  >90 mL/min   GFR calc Af Amer >90  >90 mL/min  TROPONIN I     Status: None   Collection Time    10/10/12 10:36 AM      Result Value Range   Troponin I <0.30  <0.30 ng/mL    Assessment: 52 y.o. male with chest pain     Plan:  Admit team 1 telemetry    ED Course  Procedures (including critical care time)  MDM  EKG: normal sinus rhythm, nonspecific intraventricular conduction delay.   I have discussed this case with Dr. Deretha Emory and he has been involved with the care of the patient.  He discussed plan of care with the patient.        [facility] Orlene Och, [place] 10/10/12 1310

## 2012-10-10 NOTE — Progress Notes (Signed)
Talked to on call MD about cobra form for patient transfer to [facility] in the AM for cardiac cath.  MD stated that it would be handled in the morning.

## 2012-10-10 NOTE — ED Notes (Signed)
Report attempted.  Nurse to call back.

## 2012-10-10 NOTE — ED Provider Notes (Signed)
Medical screening examination/treatment/procedure(s) were conducted as a shared visit with non-physician practitioner(s) and myself.  I personally evaluated the patient during the encounter  Shelda Jakes, MD   Results for orders placed during the hospital encounter of 10/10/12  CBC WITH DIFFERENTIAL      Result Value Range   WBC 9.2  4.0 - 10.5 K/uL   RBC 4.24  4.22 - 5.81 MIL/uL   Hemoglobin 14.3  13.0 - 17.0 g/dL   HCT 16.1 (*) 09.6 - 04.5 %   MCV 88.9  78.0 - 100.0 fL   MCH 33.7  26.0 - 34.0 pg   MCHC 37.9 (*) 30.0 - 36.0 g/dL   RDW 40.9  81.1 - 91.4 %   Platelets 253  150 - 400 K/uL   Neutrophils Relative 47  43 - 77 %   Neutro Abs 4.3  1.7 - 7.7 K/uL   Lymphocytes Relative 39  12 - 46 %   Lymphs Abs 3.5  0.7 - 4.0 K/uL   Monocytes Relative 9  3 - 12 %   Monocytes Absolute 0.9  0.1 - 1.0 K/uL   Eosinophils Relative 5  0 - 5 %   Eosinophils Absolute 0.4  0.0 - 0.7 K/uL   Basophils Relative 1  0 - 1 %   Basophils Absolute 0.1  0.0 - 0.1 K/uL  COMPREHENSIVE METABOLIC PANEL      Result Value Range   Sodium 131 (*) 135 - 145 mEq/L   Potassium 3.9  3.5 - 5.1 mEq/L   Chloride 94 (*) 96 - 112 mEq/L   CO2 28  19 - 32 mEq/L   Glucose, Bld 251 (*) 70 - 99 mg/dL   BUN 8  6 - 23 mg/dL   Creatinine, Ser 7.82  0.50 - 1.35 mg/dL   Calcium 9.8  8.4 - 95.6 mg/dL   Total Protein 7.5  6.0 - 8.3 g/dL   Albumin 4.0  3.5 - 5.2 g/dL   AST 29  0 - 37 U/L   ALT 42  0 - 53 U/L   Alkaline Phosphatase 88  39 - 117 U/L   Total Bilirubin 0.3  0.3 - 1.2 mg/dL   GFR calc non Af Amer >90  >90 mL/min   GFR calc Af Amer >90  >90 mL/min  TROPONIN I      Result Value Range   Troponin I <0.30  <0.30 ng/mL   Patient with history of chest pain at night lasting for more than an hour for the past several days. Patient was admitted at the end of December for chest pain did have a nonexercise stress test it wasn't completely normal but not questionable enough to go on to cath. Patient has risk factors  with his diabetes. Patient will require readmission consideration for cardiac cath to rule out coronary artery disease specifically. Patient's workup here today no acute changes on his EKG troponin is normal. Patient is currently pain free. But the last episode of chest pain was at 6 this morning lasting for an hour so cannot formally mL plus the pain is intermittent and lasting for an hour over the last several days so an unstable angina picture is a possibility.  Temporary admit orders done for the admission he will be admitted to telemetry to our hospitalist team.  Shelda Jakes, MD 10/10/12 1310

## 2012-10-10 NOTE — ED Provider Notes (Signed)
Medical screening examination/treatment/procedure(s) were conducted as a shared visit with non-physician practitioner(s) and myself.  I personally evaluated the patient during the encounter  Shelda Jakes, MD 10/10/12 1320

## 2012-10-10 NOTE — H&P (Signed)
[facility] History and Physical  Clayton Foster:[id number] DOB: Jul 01, 1961 DOA: 10/10/2012  Referring physician:  PCP: Tylene Fantasia., PA-C  Specialists:   Chief Complaint: chest pain  HPI: PRITESH Foster is a 52 y.o. male with past medical hx CAD s/p stents 2003, HTN, DM, anxiety, GERD, panic disorder, chronic low back pain, tobacco abuse presents to ED with CC chest pain. Information obtained from patient. Reports that 6 nights ago he was awakened by a squeezing, burning type pain located left anterior chest radiating to left arm. He also reports his left arm felt numb.  He states it lasted 5 minutes or so and he believed it to be panic attack so he "calmed himself down" . The pain subsided.  He denies palpitation, nausea, vomiting, diaphoresis, sob. Denies HA, dizziness or visual disturbances. He reports that this has occurred every night about the same time for last 5 nights. Each time he was able to "calm himself down". He did not take anything and the pain subsided after a few minutes. This am he states that when he got up he checked his BP and it was 105/75 before his medicine and his HR 114. He became concerned that the events of nights past may not be panic attacks and may be "heart attacks". Of note patient indicates he has been more anxious than usual as he needs back surgery and he has been "turned down by medicaid again" and he just recently was informed of this. Symptoms came on suddenly, have persisted, characterized as moderate. Nothing makes better or worse. In ED pt pain free, EKG without changes and initial troponin neg. Also of note, pt had nonexercise stress test  12/13 that was not entirely normal but did not require cath. Pt did not follow up with cards when discharged. Continues to smoke. We are asked to admit.   Review of Systems: The patient denies anorexia, fever, weight loss,, vision loss, decreased hearing, hoarseness, syncope, dyspnea on exertion,  peripheral edema, balance deficits, hemoptysis, abdominal pain, melena, hematochezia, severe indigestion/heartburn, hematuria, incontinence, genital sores, muscle weakness, suspicious skin lesions, transient blindness, difficulty walking, depression, unusual weight change, abnormal bleeding, enlarged lymph nodes, angioedema, and breast masses.    Past Medical History  Diagnosis Date  . Anxiety   . Hypertension   . Diabetes mellitus   . Chronic back pain   . Chronic neck pain   . Coronary artery disease    Past Surgical History  Procedure Laterality Date  . Back surgery    . Appendectomy    . Tonsillectomy    . Cardiac stents     Social History:  reports that he has been smoking Cigarettes.  He has been smoking about 1.00 pack per day and has done so for 25 years. He does not have any smokeless tobacco history on file. He reports that he does not drink alcohol or use illicit drugs. Lives with mother. Unemployed and trying to get disability for chronic back issues  No Known Allergies  No family history on file. mother alive with osteoporosis. Father alive with HTN. Sister with unknown medical hx.   Prior to Admission medications   Medication Sig Start Date End Date Taking? Authorizing Provider  acetaminophen (TYLENOL) 500 MG tablet Take 500-1,000 mg by mouth daily as needed for pain.   Yes Historical Provider, MD  aspirin EC 81 MG tablet Take 81 mg by mouth every morning.    Yes Historical Provider, MD  clonazePAM (KLONOPIN) 0.5 MG tablet  Take 0.5 mg by mouth daily as needed for anxiety. Anxiety   Yes Historical Provider, MD  cyclobenzaprine (FLEXERIL) 10 MG tablet Take 10 mg by mouth 3 (three) times daily as needed. Muscle Spasms   Yes Historical Provider, MD  fish oil-omega-3 fatty acids 1000 MG capsule Take 2 g by mouth daily.   Yes Historical Provider, MD  metFORMIN (GLUCOPHAGE) 1000 MG tablet Take 1 tablet (1,000 mg total) by mouth 2 (two) times daily with a meal. 07/23/12  Yes  Erick Blinks, MD  metoprolol (LOPRESSOR) 100 MG tablet Take 100 mg by mouth every morning. 09/28/11  Yes Lamar Laundry, MD  nitroGLYCERIN (NITROSTAT) 0.4 MG SL tablet Place 1 tablet (0.4 mg total) under the tongue every 5 (five) minutes as needed for chest pain. 07/23/12  Yes Erick Blinks, MD  oxyCODONE-acetaminophen (PERCOCET/ROXICET) 5-325 MG per tablet Take 1 tablet by mouth every 4 (four) hours as needed. Pain   Yes Historical Provider, MD  pravastatin (PRAVACHOL) 40 MG tablet Take 40 mg by mouth every evening.   Yes Historical Provider, MD  quinapril-hydrochlorothiazide (ACCURETIC) 20-25 MG per tablet Take 1 tablet by mouth every morning. 09/28/11  Yes Lamar Laundry, MD  ranitidine (ZANTAC) 150 MG tablet Take 150 mg by mouth 2 (two) times daily.   Yes Historical Provider, MD   Physical Exam: Filed Vitals:   10/10/12 0934 10/10/12 1101 10/10/12 1201  BP: 128/73 125/73 125/73  Pulse:  73 70  Temp: 98.7 F (37.1 C)    TempSrc: Oral    Resp: 16 15 16   Height: 6\' 3"  (1.905 m)    Weight: 110.678 kg (244 lb)    SpO2: 100% 99% 98%     General:  Well nourished, NAD  Eyes: PERRL EOMI no scleral icterus  ENT: ears clear, nose without drainage, oropharynx without exudate, poor dentition. Mucus membranes slightly dry  Neck: supple no JVD no lympadenopathy  Cardiovascular: RRR No murmur, gallup rub. No LEE PPP  Respiratory: normal effort BS clear bilaterally no wheeze no rhonchi  Abdomen: obese soft +BS non-tender to palpation. No mass or organomegaly  Skin: warm dry no rash or lesion  Musculoskeletal: joints without swelling/erythema. Mild tenderness to lumbar spine. Pain to lumbar spine with LE extension, leg lift. Sensation intact. No clubbing or cyanosis  Psychiatric: calm cooperative  Neurologic: cranial nerve II-XII intact. Speech clear facial symmetry.   Labs on Admission:  Basic Metabolic Panel:  Recent Labs Lab 10/10/12 1036  NA 131*  K 3.9  CL 94*  CO2 28   GLUCOSE 251*  BUN 8  CREATININE 0.92  CALCIUM 9.8   Liver Function Tests:  Recent Labs Lab 10/10/12 1036  AST 29  ALT 42  ALKPHOS 88  BILITOT 0.3  PROT 7.5  ALBUMIN 4.0   No results found for this basename: LIPASE, AMYLASE,  in the last 168 hours No results found for this basename: AMMONIA,  in the last 168 hours CBC:  Recent Labs Lab 10/10/12 1036  WBC 9.2  NEUTROABS 4.3  HGB 14.3  HCT 37.7*  MCV 88.9  PLT 253   Cardiac Enzymes:  Recent Labs Lab 10/10/12 1036  TROPONINI <0.30    BNP (last 3 results) No results found for this basename: PROBNP,  in the last 8760 hours CBG: No results found for this basename: GLUCAP,  in the last 168 hours  Radiological Exams on Admission: Dg Chest 2 View  10/10/2012  *RADIOLOGY REPORT*  Clinical Data: Chest pain.  CHEST -  2 VIEW  Comparison: 07/22/2012.  Findings: The cardiac silhouette, mediastinal and hilar contours are within normal limits and stable.  The lungs are clear.  No pleural effusion.  The bony thorax is intact.  IMPRESSION: No acute cardiopulmonary findings.   Original Report Authenticated By: Rudie Meyer, M.D.     EKG: Independently reviewed. NSR  Assessment/Plan Principal Problem:   Chest pain: some typical and atypical features. Currently pain free. Will admit to tele for observation to rule out. Will cycle CE, repeat EKG in am check lipid panel. Will provide supportive care ie.oxygen, morphine and NTG prn. continue asa and statin.  Pt with risk factors and previous non-exercise stress test that was not completely conclusive. Will request cardiology consult to assist with evaluation for need of further work up.  Active Problems:   Diabetes mellitus, type 2: uncontrolled. Will use SSI for glycemic control. Carb modified diet.  A1c 12/13 7.9.     Hypertension: currently controlled. Will hold HCTZ for now as pt looks a little dry, continue lisinopril    High cholesterol: will check lipid panel. Continue  statin    Back pain with radiculopathy: somewhat worse than  Baseline of late. Awaiting insurance approval for surgery. Has appointment 10/23/12. Is patient at spine clinic.  Continue home pain med regimen.     CAD (coronary artery disease): see #1. Currently pain free    Panic disorder: see HPI. Will continue home med regimen. Monitor.     Tobacco use: cessation counseling. Patch if needed    GERD (gastroesophageal reflux disease): stable at baseline. Will continue home med    Hyponatremia: mild likely related to decreased po intake over last couple of days due to worsening back pain. Will provide gentle hydration and recheck in am       Code Status: full Family Communication: mother at bedside Disposition Plan: home with mother when ready  Time spent: 43 minutes  [facility]   If 7PM-7AM, please contact night-coverage [email or web address] Password [facility] 10/10/2012, 1:44 PM Attending: Patient seen and examined. The above note reviewed. This patient has a previous history of coronary artery disease, has multiple risk factors and his history of the pain is worrisome. Despite normal troponin and unremarkable ECG, I think he would benefit from cardiac catheterization based on his history. Cardiology has been consulted.

## 2012-10-10 NOTE — Consult Note (Signed)
CARDIOLOGY CONSULT NOTE  Patient ID: Clayton Foster MRN: [id number] DOB/AGE: Jul 20, 1961 52 y.o.  Admit date: 10/10/2012 Referring Physician: PTH-Gosrani Primary PhysicianMUSE,ROCHELLE D., PA-C Primary Cardiologist:Suresh Audi, Molly Maduro Reason for Consultation: Chest Pain with known CAD  Principal Problem:   Chest pain Active Problems:   Diabetes mellitus, type 2   Hypertension   High cholesterol   Back pain with radiculopathy   CAD (coronary artery disease)   Panic disorder   Tobacco use   GERD (gastroesophageal reflux disease)   Hyponatremia  HPI: [name] is a 52 year old male patient who was admitted with persistent substernal chest discomfort radiation to the left shoulder with numbness and tingling in the left arm around 3 to 4 AM in the morning, over the last 5 days. The patient states that he has had ongoing stress related to chronic back pain and lower extremity pain with medical bills and denials of Medicaid. He often awakens at night worrying about this issue. During the time that he is worrying he begins to have discomfort in his chest. He takes a "nerve pill,  pain pill, and reflux pill" and has found relief of symptoms. All over the pain returns again each night. The patient is basically bedridden, 2 to chronic pain in his back and legs. He denies any exertional discomfort in his chest as he is very sedentary. He relates the discomfort in his chest to anxiety and worry, but because this has been occurring nightly for the last 5 days it has become concerning to him and therefore sought treatment in the ER.    He has a past medical history of coronary artery disease with stent placement to unknown arteries at [facility] in 2000. We saw him in the past on cardiac consultation in December of 2013. A stress Myoview was completed which was negative for ischemia. He was asked to followup in our office but did not do so. He has not had nitroglycerin at home for pain relief. He states  ongoing problems with his back and legs and medical bills would not allow him to seek ongoing cardiology treatment.    Review of labs in the emergency room did not reveal positive cardiac enzyme, his sodium was 131 with a normal potassium, EKG revealed normal sinus rhythm with a nonspecific intraventricular conduction delay, without evidence of ACS. Chest x-ray was negative for acute cardio pulmonary abnormalities. Review of vital signs demonstrated blood pressure and heart rate within normal range he was afebrile. He was treated with colonic and Zofran with pain relief. Due  recurrent chest pain at rest, and no history of CAD, cardiology recommendations are requested.     Review of systems complete and found to be negative unless listed above   Past Medical History  Diagnosis Date  . Anxiety   . Hypertension   . Diabetes mellitus   . Chronic back pain   . Chronic neck pain   . Coronary artery disease     Family History  Problem Relation Age of Onset  . Hypertension Father     History   Social History  . Marital Status: Divorced    Spouse Name: N/A    Number of Children: N/A  . Years of Education: N/A   Occupational History  . Unemployed    Social History Main Topics  . Smoking status: Current Every Day Smoker -- 1.00 packs/day    Types: Cigarettes  . Smokeless tobacco: Not on file  . Alcohol Use: No  . Drug Use: No  .  Sexually Active: Not on file   Other Topics Concern  . Not on file   Social History Narrative  . No narrative on file    Past Surgical History  Procedure Laterality Date  . Back surgery    . Appendectomy    . Tonsillectomy    . Cardiac stents       Prescriptions prior to admission  Medication Sig Dispense Refill  . acetaminophen (TYLENOL) 500 MG tablet Take 500-1,000 mg by mouth daily as needed for pain.      Marland Kitchen aspirin EC 81 MG tablet Take 81 mg by mouth every morning.       . clonazePAM (KLONOPIN) 0.5 MG tablet Take 0.5 mg by mouth daily as  needed for anxiety. Anxiety      . cyclobenzaprine (FLEXERIL) 10 MG tablet Take 10 mg by mouth 3 (three) times daily as needed. Muscle Spasms      . fish oil-omega-3 fatty acids 1000 MG capsule Take 2 g by mouth daily.      . metFORMIN (GLUCOPHAGE) 1000 MG tablet Take 1 tablet (1,000 mg total) by mouth 2 (two) times daily with a meal.  60 tablet  1  . metoprolol (LOPRESSOR) 100 MG tablet Take 100 mg by mouth every morning.      . nitroGLYCERIN (NITROSTAT) 0.4 MG SL tablet Place 1 tablet (0.4 mg total) under the tongue every 5 (five) minutes as needed for chest pain.  30 tablet  12  . oxyCODONE-acetaminophen (PERCOCET/ROXICET) 5-325 MG per tablet Take 1 tablet by mouth every 4 (four) hours as needed. Pain      . pravastatin (PRAVACHOL) 40 MG tablet Take 40 mg by mouth every evening.      . quinapril-hydrochlorothiazide (ACCURETIC) 20-25 MG per tablet Take 1 tablet by mouth every morning.      . ranitidine (ZANTAC) 150 MG tablet Take 150 mg by mouth 2 (two) times daily.       Physical Exam: Blood pressure 157/95, pulse 79, temperature 97.7 F (36.5 C), temperature source Oral, resp. rate 18, height 6\' 3"  (1.905 m), weight 247 lb (112.038 kg), SpO2 97.00%.   General: Well developed, well nourished, in no acute distress Head: Eyes PERRLA, No xanthomas.   Normal cephalic and atramatic  Lungs: Clear bilaterally to auscultation and percussion. Heart: HRRR S1 S2, without MRG.  Pulses are 2+ & equal.            No carotid bruit. No JVD.  No abdominal bruits. No femoral bruits. Abdomen: Bowel sounds are positive, abdomen soft and non-tender without masses or                  Hernia's noted. Msk:  Back normal, normal gait. Normal strength and tone for age. Extremities: No clubbing, cyanosis or edema.  DP +1 Neuro: Alert and oriented X 3. Psych:  Good affect, responds appropriately, anxious and talkative.   Labs:   Lab Results  Component Value Date   WBC 9.2 10/10/2012   HGB 14.3 10/10/2012   HCT  37.7* 10/10/2012   MCV 88.9 10/10/2012   PLT 253 10/10/2012     Recent Labs Lab 10/10/12 1036  NA 131*  K 3.9  CL 94*  CO2 28  BUN 8  CREATININE 0.92  CALCIUM 9.8  PROT 7.5  BILITOT 0.3  ALKPHOS 88  ALT 42  AST 29  GLUCOSE 251*   Lab Results  Component Value Date   TROPONINI <0.30 10/10/2012    Lab  Results  Component Value Date   CHOL 187 10/10/2012   CHOL 210* 07/22/2012   Lab Results  Component Value Date   HDL 27* 10/10/2012   HDL 28* 07/22/2012   Lab Results  Component Value Date   LDLCALC 125* 10/10/2012   LDLCALC 130* 07/22/2012   Lab Results  Component Value Date   TRIG 173* 10/10/2012   TRIG 259* 07/22/2012   Lab Results  Component Value Date   CHOLHDL 6.9 10/10/2012   CHOLHDL 7.5 07/22/2012   No results found for this basename: LDLDIRECT    Nuclear StressTest 06/2012 IMPRESSION: Abnormal pharmacologic stress nuclear myocardial study revealing no stress induced EKG abnormalities, normal left ventricular size and normal left ventricular systolic function. By scintigraphic imaging, there was a small area of apparent mild inferior ischemia, perhaps with a component of infarction. The possibility that motion artifact, variable diaphragmatic attenuation and/or incorrect normalization contributed to these abnormalities must be considered. Overall, this is a low to moderate risk study with a single small perfusion defect. Other findings as noted.  Radiology: Dg Chest 2 View  10/10/2012  *RADIOLOGY REPORT*  Clinical Data: Chest pain.  CHEST - 2 VIEW  Comparison: 07/22/2012.  Findings: The cardiac silhouette, mediastinal and hilar contours are within normal limits and stable.  The lungs are clear.  No pleural effusion.  The bony thorax is intact.  IMPRESSION: No acute cardiopulmonary findings.   Original Report Authenticated By: Rudie Meyer, M.D.    EKG:NSR with nonspecific intraventricular conduction delay. Rate of 74 bpm.  ASSESSMENT AND PLAN:   1.  Chest Pain: Has a history of CAD with stent placement to unknown artery in 2000 with recurrent discomfort at rest, associated with anxiety. The pain radiates to his left shoulder with numbness and tingling in his left arm. The patient is very sedentary. Suspect occurring every night for the last 5 nights. It is relieved with Klonopin, Zantac, and oxycodone. He has multiple ongoing cardiovascular risk factors to include ongoing tobacco abuse, diabetes, hypercholesterolemia, hypertension, and ongoing anxiety and stress.    The pain is worrisome for unstable angina, in the setting of known CAD, with stent placement approximately 14 years ago. The patient had a stress Myoview completed in 2013 revealing a small area of apparent mild inferior ischemia, perhaps with a component of infarction. However the possibility of motion artifact, and variable diaphragmatic attenuation was also considered. He was found to be a low to moderate risk at that time.     Consideration for cardiac catheterization is not unreasonable. He has spoken with Dr. Dietrich Pates and is willing to be transferred to [facility] for cath.  Will be planned for am of 10/11/2012. We will place nitroglycerin paste transdermally.  2. Hypertension: Currently well controlled under current medication regimen. He will be continued on quinapril, metoprolol. Creatinine 0.92.  3. Hypercholesterolemia: He is not found to be on statin during this admission. We will place him back on pravastatin 40 mg daily. Review of cholesterol status demonstrates total cholesterol 187 triglycerides of 173 HDL 27 LDL 125.  4. Diabetes: Hgb A1C is recommended.  5. Chronic Back Pain: On several medications for chronic pain, along with planned for surgery at [facility] per patient's report.  6. Chronic Anxiety: Self admitted with medications for relief of recurrent symptoms.  7. Ongoing tobacco abuse: Patient continues to smoke a pack to a pack and half a day for the last 30 years. He has  been made aware of the cardiac risk factors for ongoing  smoking and likelihood that this is precipitating his discomfort and progression of CAD. He verbalizes understanding. He is not inclined to quit smoking at this time.  Bettey Mare. Lyman Bishop NP [facility] 10/10/2012, 4:43 PM  Cardiology Attending Patient interviewed and examined. Discussed with Joni Reining, NP.  Above note annotated and modified based upon my findings.  Regino Ramirez Bing, MD 10/11/2012, 5:47 PM

## 2012-10-10 NOTE — ED Notes (Signed)
Pt states he has not had any pain to the chest since he has been here. Pt is jittery at times, anxious. NAD. PT states he would like to know more about the reason for being admitted. NP aware that pt wants more information.

## 2012-10-11 ENCOUNTER — Other Ambulatory Visit: Payer: Self-pay

## 2012-10-11 ENCOUNTER — Observation Stay (HOSPITAL_COMMUNITY): Payer: Medicaid Other

## 2012-10-11 ENCOUNTER — Encounter (HOSPITAL_COMMUNITY)
Admission: EM | Disposition: A | Discharge status: Home / Self Care | Payer: Self-pay | Source: Home / Self Care | Attending: Cardiology

## 2012-10-11 DIAGNOSIS — I2510 Atherosclerotic heart disease of native coronary artery without angina pectoris: Secondary | ICD-10-CM

## 2012-10-11 HISTORY — PX: LEFT HEART CATHETERIZATION WITH CORONARY ANGIOGRAM: SHX5451

## 2012-10-11 LAB — COMPREHENSIVE METABOLIC PANEL
Alkaline Phosphatase: 82 U/L (ref 39–117)
BUN: 10 mg/dL (ref 6–23)
Chloride: 97 mEq/L (ref 96–112)
GFR calc Af Amer: >90 mL/min (ref >90)
Glucose, Bld: 122 mg/dL — ABNORMAL HIGH (ref 70–99)
Potassium: 3.9 mEq/L (ref 3.5–5.1)
Total Bilirubin: 0.3 mg/dL (ref 0.3–1.2)

## 2012-10-11 LAB — GLUCOSE, CAPILLARY
Glucose-Capillary: 133 mg/dL — ABNORMAL HIGH (ref 70–99)
Glucose-Capillary: 108 mg/dL — ABNORMAL HIGH (ref 70–99)
Glucose-Capillary: 214 mg/dL — ABNORMAL HIGH (ref 70–99)

## 2012-10-11 LAB — CBC
MCV: 91.8 fL (ref 78.0–100.0)
Platelets: 242 10*3/uL (ref 150–400)
RDW: 12.3 % (ref 11.5–15.5)
WBC: 9.9 10*3/uL (ref 4.0–10.5)

## 2012-10-11 LAB — PROTIME-INR: INR: 1.04 (ref 0.00–1.49)

## 2012-10-11 SURGERY — LEFT HEART CATHETERIZATION WITH CORONARY ANGIOGRAM
Anesthesia: LOCAL

## 2012-10-11 MED ORDER — SODIUM CHLORIDE 0.9 % IJ SOLN
3.0000 mL | Freq: Two times a day (BID) | INTRAMUSCULAR | Status: DC
  Administered 2012-10-11 – 2012-10-14 (×4): 3 mL via INTRAVENOUS

## 2012-10-11 MED ORDER — SODIUM CHLORIDE 0.9 % IV SOLN: 250.0000 mL | INTRAVENOUS | Status: DC | PRN

## 2012-10-11 MED ORDER — METOPROLOL TARTRATE 25 MG PO TABS
25.0000 mg | ORAL_TABLET | Freq: Two times a day (BID) | ORAL | Status: DC
  Administered 2012-10-11 – 2012-10-15 (×9): 25 mg via ORAL
  Filled 2012-10-11 (×10): qty 1

## 2012-10-11 MED ORDER — ASPIRIN 81 MG PO CHEW: 324.0000 mg | CHEWABLE_TABLET | ORAL | Status: DC

## 2012-10-11 MED ORDER — SODIUM CHLORIDE 0.9 % IV SOLN: 1.0000 mL/kg/h | INTRAVENOUS | Status: DC

## 2012-10-11 MED ORDER — FENTANYL CITRATE 0.05 MG/ML IJ SOLN
INTRAMUSCULAR | Status: AC
  Filled 2012-10-11: qty 2

## 2012-10-11 MED ORDER — LIDOCAINE HCL (PF) 1 % IJ SOLN
INTRAMUSCULAR | Status: AC
  Filled 2012-10-11: qty 30

## 2012-10-11 MED ORDER — ONDANSETRON HCL 4 MG/2ML IJ SOLN: 4.0000 mg | Freq: Four times a day (QID) | INTRAMUSCULAR | Status: DC | PRN

## 2012-10-11 MED ORDER — LISINOPRIL 5 MG PO TABS
5.0000 mg | ORAL_TABLET | Freq: Every day | ORAL | Status: DC
  Administered 2012-10-12 – 2012-10-15 (×4): 5 mg via ORAL
  Filled 2012-10-11 (×4): qty 1

## 2012-10-11 MED ORDER — ACETAMINOPHEN 325 MG PO TABS: 650.0000 mg | ORAL_TABLET | ORAL | Status: DC | PRN

## 2012-10-11 MED ORDER — VERAPAMIL HCL 2.5 MG/ML IV SOLN
INTRAVENOUS | Status: AC
  Filled 2012-10-11: qty 2

## 2012-10-11 MED ORDER — HYDROMORPHONE HCL PF 2 MG/ML IJ SOLN
INTRAMUSCULAR | Status: AC
  Filled 2012-10-11: qty 1

## 2012-10-11 MED ORDER — SODIUM CHLORIDE 0.9 % IJ SOLN: 3.0000 mL | INTRAMUSCULAR | Status: DC | PRN

## 2012-10-11 MED ORDER — SODIUM CHLORIDE 0.9 % IV SOLN
INTRAVENOUS | Status: AC
  Administered 2012-10-11: 17:00:00 via INTRAVENOUS

## 2012-10-11 MED ORDER — MIDAZOLAM HCL 2 MG/2ML IJ SOLN
INTRAMUSCULAR | Status: AC
  Filled 2012-10-11: qty 2

## 2012-10-11 MED ORDER — HEPARIN (PORCINE) IN NACL 100-0.45 UNIT/ML-% IJ SOLN
1950.0000 [IU]/h | INTRAMUSCULAR | Status: DC
  Administered 2012-10-11: 1500 [IU]/h via INTRAVENOUS
  Administered 2012-10-12 (×2): 1750 [IU]/h via INTRAVENOUS
  Administered 2012-10-13 – 2012-10-14 (×3): 1950 [IU]/h via INTRAVENOUS
  Filled 2012-10-11 (×6): qty 250

## 2012-10-11 MED ORDER — DIAZEPAM 5 MG PO TABS: 5.0000 mg | ORAL_TABLET | ORAL | Status: DC

## 2012-10-11 MED ORDER — HEPARIN (PORCINE) IN NACL 2-0.9 UNIT/ML-% IJ SOLN
INTRAMUSCULAR | Status: AC
  Filled 2012-10-11: qty 1000

## 2012-10-11 MED ORDER — ASPIRIN 81 MG PO CHEW
81.0000 mg | CHEWABLE_TABLET | Freq: Every day | ORAL | Status: DC
  Administered 2012-10-13 – 2012-10-14 (×2): 81 mg via ORAL
  Filled 2012-10-11 (×2): qty 1

## 2012-10-11 MED ORDER — ALBUTEROL SULFATE (5 MG/ML) 0.5% IN NEBU
2.5000 mg | INHALATION_SOLUTION | Freq: Once | RESPIRATORY_TRACT | Status: AC
  Administered 2012-10-11: 2.5 mg via RESPIRATORY_TRACT

## 2012-10-11 NOTE — CV Procedure (Addendum)
   Cardiac Catheterization Procedure Note  Name: Clayton Foster MRN: [id number] DOB: April 30, 1961  Procedure: Left Heart Cath, Selective Coronary Angiography, LV angiography  Indication: Unstable angina   Procedural Details: The right wrist was prepped, draped, and anesthetized with 1% lidocaine. Using the modified Seldinger technique, a 5 French sheath was introduced into the right radial artery. 3 mg of verapamil was administered through the sheath, weight-based unfractionated heparin was administered intravenously. Standard Judkins catheters were used for selective coronary angiography and left ventriculography. Catheter exchanges were performed over an exchange length guidewire. There were no immediate procedural complications. A TR band was used for radial hemostasis at the completion of the procedure.  The patient was transferred to the post catheterization recovery area for further monitoring.  Procedural Findings: Hemodynamics: AO 99/66 LV 110/16  Coronary angiography: Coronary dominance: right  Left mainstem: 30% distal tapering of the LM.    Left anterior descending (LAD): There is a stent in the proximal LAD with diffuse up to 40% in-stent restenosis.  40% mid LAD stenosis at takeoff of a moderate diagonal.   This was followed by a 95% mid LAD stenosis.   Left circumflex (LCx): 30% proximal LCx at OM1 takeoff.  Luminal irregularities throughout the remainder of the LCx system.   Right coronary artery (RCA): There is a stent in the mid RCA.  80% stenosis in the proximal RCA prior to stent.  Serial 40% mid RCA stenoses. The PDA is relatively small with an 80% mid vessel stenosis.   Left ventriculography: Left ventricular systolic function is normal, LVEF is estimated at 60-65%, there is no significant wall motion abnormality.   Final Conclusions:  Most significant stenoses are 80% proximal RCA stenosis and 95% mid LAD stenosis.  The stents in the proximal LAD and the RCA are  patent with < 50% in-stent restenosis.  Lesions can be approached percutaneously.  Patient states that he will need back surgery in the near future.  I told him that if we did PCI, I would want to use DES requiring 1 year of DAPT prior to surgery.  I told him that CABG would be an alternative.  He does not think that he wants CABG.  He wants to think things over.  I will discuss further with his mother and him.  If he opts for PCI, will arrange for Monday.   Marca Ancona 10/11/2012, 1:32 PM   I talked to the patient's mother and aunt.  They do not think that he could wait a year for back surgery.  CABG remains an option.  I will consult CVTS to talk to the patient.  He needs either PCI or CABG prior to discharge given his presenting symptoms and anatomy.   Marca Ancona 10/11/2012 2:13 PM

## 2012-10-11 NOTE — Interval H&P Note (Signed)
History and Physical Interval Note:  10/11/2012 12:39 PM  Clayton Foster  has presented today for surgery, with the diagnosis of cp  The various methods of treatment have been discussed with the patient and family. After consideration of risks, benefits and other options for treatment, the patient has consented to  Procedure(s): LEFT HEART CATHETERIZATION WITH CORONARY ANGIOGRAM (N/A) as a surgical intervention .  The patient's history has been reviewed, patient examined, no change in status, stable for surgery.  I have reviewed the patient's chart and labs.  Questions were answered to the patient's satisfaction.     Jorene Kaylor [employer]

## 2012-10-11 NOTE — Progress Notes (Signed)
Inpatient Diabetes Program Recommendations  AACE/ADA: New Consensus Statement on Inpatient Glycemic Control (2013)  Target Ranges:  Prepandial:   less than 140 mg/dL      Peak postprandial:   less than 180 mg/dL (1-2 hours)      Critically ill patients:  140 - 180 mg/dL   Results for Clayton Foster, Clayton Foster (MRN [id number]) as of 10/11/2012 07:29  Ref. Range 10/10/2012 21:03  Glucose-Capillary Latest Range: 70-99 mg/dL 841 (H)  Results for Clayton Foster, Clayton Foster (MRN [id number]) as of 10/11/2012 07:29  Ref. Range 10/10/2012 10:36 10/11/2012 05:28  Glucose Latest Range: 70-99 mg/dL 253 (H) 664 (H)    Inpatient Diabetes Program Recommendations Correction (SSI): Please consider ordering CBGs ACHS or Q4H (while NPO) with Novolog correction scale.  Note: Patient has a history of diabetes and takes Metformin 1000 mg BID at home for diabetes management.  Currently, patient is ordered to receive Metformin 1000 mg BID for inpatient glycemic control and CBG is ordered for QAM only.  Initial lab blood glucose was 251 mg/dl on 10/24/4740 and fasting lab glucose this morning was 122 mg/dl.  Please consider ordering CBGs ACHS (or Q4H while NPO) along with Novolog correction scale while inpatient.  Note that plan is to transfer to [facility] today for procedure.  Will continue to follow.  Thanks, Orlando Penner, RN, BSN, CCRN Diabetes Coordinator Inpatient Diabetes Program [phone]

## 2012-10-11 NOTE — Progress Notes (Signed)
Discussed the patients medications with Natalia Leatherwood and Clydie Braun about the patients medications.  Asked Natalia Leatherwood with cardiology if the patient should take any medications such as his Metoprolol this am.  She stated to hold all Diabetic Medications.  I told her I would hold his Metformin.  1025 approx.  I called Natalia Leatherwood back to verify if the patient should be receiving metoprolol 100 mg this am his BP was 108/72 HR 77.  I voiced to her that the patient stated that he took 50 mg bid.  I verified this dosage with the [facility] pharmacy and they said that the patient takes the dose 50 mg bid.  I also asked her if the patient should get his lisinopril this am.   Natalia Leatherwood stated she would place orders for Metoprolol and Lisinopril.  1029 Report was given to Mila Homer, Rn with [facility].  He verbalized understanding.  I voiced what meds I had given him this am, and that otherwise the patient had bee NPO.  1115:  Pt was transferred from floor going to [facility] with [facility] staff with packet in stable condition.

## 2012-10-11 NOTE — Progress Notes (Signed)
UR Chart Review Completed  

## 2012-10-11 NOTE — Progress Notes (Signed)
Progress note  Subjective: pt feeling better this am. No further CP. States slept well  Exam: Gen: awake alert smiling, talkative CV RRR No MGR No LEE Resp: normal effort BS clear bilaterally no wheeze Abd: obese, soft +BS non-tender to palpation Neuro: cranial nerve II-XII intact   Assessment/Plan Chest pain: pt for cath at [facility] this am. No further CP. No events on tele DM: A1c 7.9 12/14.  HTN: controlled   Lesle Chris. Black, NP Attending: Patient is clinically stable. Serial cardiac enzymes are negative. ECG does not show any significant worrisome features at this point but his history is worrisome. I agree with transfer to [facility] for cardiac catheterization. Appreciate help with cardiology.

## 2012-10-11 NOTE — H&P (View-Only) (Signed)
SUBJECTIVE:No further complaints of chest pain. Slept well.  Principal Problem:   Chest pain Active Problems:   Diabetes mellitus, type 2   Hypertension   High cholesterol   Back pain with radiculopathy   CAD (coronary artery disease)   Panic disorder   Tobacco use   GERD (gastroesophageal reflux disease)   Hyponatremia   LABS: Basic Metabolic Panel:  Recent Labs  29/56/21 1036 10/11/12 0528  NA 131* 135  K 3.9 3.9  CL 94* 97  CO2 28 27  GLUCOSE 251* 122*  BUN 8 10  CREATININE 0.92 0.92  CALCIUM 9.8 9.3   Liver Function Tests:  Recent Labs  10/10/12 1036 10/11/12 0528  AST 29 26  ALT 42 37  ALKPHOS 88 82  BILITOT 0.3 0.3  PROT 7.5 6.8  ALBUMIN 4.0 3.7   CBC:  Recent Labs  10/10/12 1036 10/11/12 0528  WBC 9.2 9.9  NEUTROABS 4.3  --   HGB 14.3 13.4  HCT 37.7* 37.1*  MCV 88.9 91.8  PLT 253 242   Cardiac Enzymes:  Recent Labs  10/10/12 1036 10/10/12 1620 10/10/12 2152  TROPONINI <0.30 <0.30 <0.30   @BMPLAST3 @ D-Dimer: No results found for this basename: DDIMER,  in the last 72 hours Hemoglobin A1C: No results found for this basename: HGBA1C,  in the last 72 hours Fasting Lipid Panel:  Recent Labs  10/10/12 1036  CHOL 187  HDL 27*  LDLCALC 125*  TRIG 173*  CHOLHDL 6.9    RADIOLOGY: Dg Chest 2 View  10/10/2012  *RADIOLOGY REPORT*  Clinical Data: Chest pain.  CHEST - 2 VIEW  Comparison: 07/22/2012.  Findings: The cardiac silhouette, mediastinal and hilar contours are within normal limits and stable.  The lungs are clear.  No pleural effusion.  The bony thorax is intact.  IMPRESSION: No acute cardiopulmonary findings.   Original Report Authenticated By: Rudie Meyer, M.D.      PHYSICAL EXAM BP 105/60  Pulse 82  Temp(Src) 97.6 F (36.4 C) (Oral)  Resp 20  Ht 6\' 3"  (1.905 m)  Wt 247 lb (112.038 kg)  BMI 30.87 kg/m2  SpO2 96% General: Well developed, well nourished, in no acute distress Head: Eyes PERRLA, No xanthomas.   Normal  cephalic and atramatic  Lungs: Clear bilaterally to auscultation and percussion. Heart: HRRR S1 S2, No MRG .  Pulses are 2+ & equal.            No carotid bruit. No JVD.  No abdominal bruits. No femoral bruits. Abdomen: Bowel sounds are positive, abdomen soft and non-tender without masses or                  Hernia's noted. Msk:  Back normal, normal gait. Normal strength and tone for age. Extremities: No clubbing, cyanosis or edema.  DP +1 Neuro: Alert and oriented X 3. Psych:  Good affect, responds appropriately  TELEMETRY: Reviewed telemetry pt in: NSR rate of 70's.   ASSESSMENT AND PLAN: 1. Chest Pain:  The pain is worrisome for unstable angina, in the setting of known CAD, with stent placement approximately 14 years ago. He has been pain free overnight with NTG paste and narcotics. Metformin is held this am.    2. Hypertension: Currently well controlled under current medication regimen. He will be continued on quinapril, metoprolol. Creatinine 0.92.   3. Hypercholesterolemia: He is not found to be on statin during this admission. We will place him back on pravastatin 40 mg daily. Review of cholesterol status  demonstrates total cholesterol 187 triglycerides of 173 HDL 27 LDL 125.   4. Diabetes:   5. Chronic Back Pain: On several medications for chronic pain, along with planned for surgery at [facility] per patient's report. Pain controlled overnight with scheduled pain management medications.  6. Chronic Anxiety: Self admitted with medications for relief of recurrent symptoms.   7. Ongoing tobacco abuse: Patient continues to smoke a pack to a pack and half a day for the last 30 years. He has been made aware of the cardiac risk factors for ongoing smoking and likelihood that this is precipitating his discomfort and progression of CAD. He verbalizes understanding. He is not inclined to quit smoking at this time.  Bettey Mare. Lyman Bishop NP [facility] 10/11/2012, 8:40 AM  Cardiology  Attending Patient interviewed and examined. Discussed with Joni Reining, NP.  Above note annotated and modified based upon my findings.  No symptoms over the past 24 hours.  No abnormalities on EKG this morning. We will proceed with transport to [facility] and cardiac catheterization as previously planned and discussed with patient.  Grasston Bing, MD 10/11/2012, 10:29 AM

## 2012-10-11 NOTE — Progress Notes (Signed)
SUBJECTIVE:No further complaints of chest pain. Slept well.  Principal Problem:   Chest pain Active Problems:   Diabetes mellitus, type 2   Hypertension   High cholesterol   Back pain with radiculopathy   CAD (coronary artery disease)   Panic disorder   Tobacco use   GERD (gastroesophageal reflux disease)   Hyponatremia   LABS: Basic Metabolic Panel:  Recent Labs  10/10/12 1036 10/11/12 0528  NA 131* 135  K 3.9 3.9  CL 94* 97  CO2 28 27  GLUCOSE 251* 122*  BUN 8 10  CREATININE 0.92 0.92  CALCIUM 9.8 9.3   Liver Function Tests:  Recent Labs  10/10/12 1036 10/11/12 0528  AST 29 26  ALT 42 37  ALKPHOS 88 82  BILITOT 0.3 0.3  PROT 7.5 6.8  ALBUMIN 4.0 3.7   CBC:  Recent Labs  10/10/12 1036 10/11/12 0528  WBC 9.2 9.9  NEUTROABS 4.3  --   HGB 14.3 13.4  HCT 37.7* 37.1*  MCV 88.9 91.8  PLT 253 242   Cardiac Enzymes:  Recent Labs  10/10/12 1036 10/10/12 1620 10/10/12 2152  TROPONINI <0.30 <0.30 <0.30   @BMPLAST3@ D-Dimer: No results found for this basename: DDIMER,  in the last 72 hours Hemoglobin A1C: No results found for this basename: HGBA1C,  in the last 72 hours Fasting Lipid Panel:  Recent Labs  10/10/12 1036  CHOL 187  HDL 27*  LDLCALC 125*  TRIG 173*  CHOLHDL 6.9    RADIOLOGY: Dg Chest 2 View  10/10/2012  *RADIOLOGY REPORT*  Clinical Data: Chest pain.  CHEST - 2 VIEW  Comparison: 07/22/2012.  Findings: The cardiac silhouette, mediastinal and hilar contours are within normal limits and stable.  The lungs are clear.  No pleural effusion.  The bony thorax is intact.  IMPRESSION: No acute cardiopulmonary findings.   Original Report Authenticated By: P. Gallerani, M.D.      PHYSICAL EXAM BP 105/60  Pulse 82  Temp(Src) 97.6 F (36.4 C) (Oral)  Resp 20  Ht 6' 3" (1.905 m)  Wt 247 lb (112.038 kg)  BMI 30.87 kg/m2  SpO2 96% General: Well developed, well nourished, in no acute distress Head: Eyes PERRLA, No xanthomas.   Normal  cephalic and atramatic  Lungs: Clear bilaterally to auscultation and percussion. Heart: HRRR S1 S2, No MRG .  Pulses are 2+ & equal.            No carotid bruit. No JVD.  No abdominal bruits. No femoral bruits. Abdomen: Bowel sounds are positive, abdomen soft and non-tender without masses or                  Hernia's noted. Msk:  Back normal, normal gait. Normal strength and tone for age. Extremities: No clubbing, cyanosis or edema.  DP +1 Neuro: Alert and oriented X 3. Psych:  Good affect, responds appropriately  TELEMETRY: Reviewed telemetry pt in: NSR rate of 70's.   ASSESSMENT AND PLAN: 1. Chest Pain:  The pain is worrisome for unstable angina, in the setting of known CAD, with stent placement approximately 14 years ago. He has been pain free overnight with NTG paste and narcotics. Metformin is held this am.    2. Hypertension: Currently well controlled under current medication regimen. He will be continued on quinapril, metoprolol. Creatinine 0.92.   3. Hypercholesterolemia: He is not found to be on statin during this admission. We will place him back on pravastatin 40 mg daily. Review of cholesterol status   demonstrates total cholesterol 187 triglycerides of 173 HDL 27 LDL 125.   4. Diabetes:   5. Chronic Back Pain: On several medications for chronic pain, along with planned for surgery at [facility] per patient's report. Pain controlled overnight with scheduled pain management medications.  6. Chronic Anxiety: Self admitted with medications for relief of recurrent symptoms.   7. Ongoing tobacco abuse: Patient continues to smoke a pack to a pack and half a day for the last 30 years. He has been made aware of the cardiac risk factors for ongoing smoking and likelihood that this is precipitating his discomfort and progression of CAD. He verbalizes understanding. He is not inclined to quit smoking at this time.  Kathryn M. Lawrence NP [facility] 10/11/2012, 8:40 AM  Cardiology  Attending Patient interviewed and examined. Discussed with Kathryn Lawrence, NP.  Above note annotated and modified based upon my findings.  No symptoms over the past 24 hours.  No abnormalities on EKG this morning. We will proceed with transport to [facility] and cardiac catheterization as previously planned and discussed with patient.  Kaylon Laroche, MD 10/11/2012, 10:29 AM         

## 2012-10-11 NOTE — Progress Notes (Signed)
Day of Surgery Procedure(s) (LRB): LEFT HEART CATHETERIZATION WITH CORONARY ANGIOGRAM (N/A) Subjective: Patient examined and cor arteriograms reviewed He has debilitating radiculating LBP w/ sig difficulty walking which would have neg impact on recovery post CABG He would have to wait 3 months after CABG for back surgery He has not been evaluated by a spine surgeon yet so surgery has not yet been recommended by a surgeon  I rec PCI so he could have surgery- if indicated - sooner with integrilin bridge If not a candidate for (redo) back surgery PCI would clearly be the best option  Objective: Vital signs in last 24 hours: Temp:  [97.6 F (36.4 C)-98.2 F (36.8 C)] 98.2 F (36.8 C) (03/21 1626) Pulse Rate:  [77-91] 85 (03/21 1626) Cardiac Rhythm:  [-] Normal sinus rhythm (03/21 1619) Resp:  [20-22] 22 (03/21 1049) BP: (95-115)/(52-73) 115/73 mmHg (03/21 1626) SpO2:  [91 %-99 %] 99 % (03/21 1626)  Hemodynamic parameters for last 24 hours:  nsr  Intake/Output from previous day: 03/20 0701 - 03/21 0700 In: 240 [P.O.:240] Out: -  Intake/Output this shift: Total I/O In: 360 [P.O.:360] Out: -   Alert and comfortable Lungs w/ rhonchi No cardiac murmmur R wrist dressing Great difficulty standing  Lab Results:  Recent Labs  10/10/12 1036 10/11/12 0528  WBC 9.2 9.9  HGB 14.3 13.4  HCT 37.7* 37.1*  PLT 253 242   BMET:  Recent Labs  10/10/12 1036 10/11/12 0528  NA 131* 135  K 3.9 3.9  CL 94* 97  CO2 28 27  GLUCOSE 251* 122*  BUN 8 10  CREATININE 0.92 0.92  CALCIUM 9.8 9.3    PT/INR:  Recent Labs  10/11/12 0723  LABPROT 13.5  INR 1.04   ABG No results found for this basename: phart, pco2, po2, hco3, tco2, acidbasedef, o2sat   CBG (last 3)   Recent Labs  10/11/12 0801 10/11/12 1347 10/11/12 1655  GLUCAP 133* 108* 214*    Assessment/Plan: S/P Procedure(s) (LRB): LEFT HEART CATHETERIZATION WITH CORONARY ANGIOGRAM (N/A) rec PCI as above   LOS: 1  day    VAN TRIGT III,PETER 10/11/2012

## 2012-10-11 NOTE — Progress Notes (Signed)
ANTICOAGULATION CONSULT NOTE - Initial Consult  Pharmacy Consult for heparin Indication: chest pain/ACS  No Known Allergies  Patient Measurements: Height: 6\' 3"  (190.5 cm) Weight: 247 lb (112.038 kg) IBW/kg (Calculated) : 84.5 Heparin Dosing Weight: 107kg  Vital Signs: Temp: 97.8 F (36.6 C) (03/21 1049) BP: 108/72 mmHg (03/21 1049) Pulse Rate: 80 (03/21 1257)  Labs:  Recent Labs  10/10/12 1036 10/10/12 1620 10/10/12 2152 10/11/12 0528 10/11/12 0723  HGB 14.3  --   --  13.4  --   HCT 37.7*  --   --  37.1*  --   PLT 253  --   --  242  --   LABPROT  --   --   --   --  13.5  INR  --   --   --   --  1.04  CREATININE 0.92  --   --  0.92  --   TROPONINI <0.30 <0.30 <0.30  --   --     Estimated Creatinine Clearance: 129.8 ml/min (by C-G formula based on Cr of 0.92).   Medical History: Past Medical History  Diagnosis Date  . Anxiety   . Hypertension   . Diabetes mellitus   . Chronic back pain   . Chronic neck pain   . Coronary artery disease     Medications:  Prescriptions prior to admission  Medication Sig Dispense Refill  . acetaminophen (TYLENOL) 500 MG tablet Take 500-1,000 mg by mouth daily as needed for pain.      Marland Kitchen aspirin EC 81 MG tablet Take 81 mg by mouth every morning.       . clonazePAM (KLONOPIN) 0.5 MG tablet Take 0.5 mg by mouth daily as needed for anxiety. Anxiety      . cyclobenzaprine (FLEXERIL) 10 MG tablet Take 10 mg by mouth 3 (three) times daily as needed. Muscle Spasms      . fish oil-omega-3 fatty acids 1000 MG capsule Take 2 g by mouth daily.      . metFORMIN (GLUCOPHAGE) 1000 MG tablet Take 1 tablet (1,000 mg total) by mouth 2 (two) times daily with a meal.  60 tablet  1  . metoprolol (LOPRESSOR) 100 MG tablet Take 100 mg by mouth every morning.      . nitroGLYCERIN (NITROSTAT) 0.4 MG SL tablet Place 1 tablet (0.4 mg total) under the tongue every 5 (five) minutes as needed for chest pain.  30 tablet  12  . oxyCODONE-acetaminophen  (PERCOCET/ROXICET) 5-325 MG per tablet Take 1 tablet by mouth every 4 (four) hours as needed. Pain      . pravastatin (PRAVACHOL) 40 MG tablet Take 40 mg by mouth every evening.      . quinapril-hydrochlorothiazide (ACCURETIC) 20-25 MG per tablet Take 1 tablet by mouth every morning.      . ranitidine (ZANTAC) 150 MG tablet Take 150 mg by mouth 2 (two) times daily.        Assessment: 52 yo male s/p cath w/ RCA and LAD stenosis. Plans for PCI vs CABG. Patient to start heparin 8 hrs post sheath removal. Patient noted on sq lovenox for DVT prophylaxis with last dose 10/10/12 in pm.  Goal of Therapy:  Heparin level 0.3-0.7 units/ml Monitor platelets by anticoagulation protocol: Yes   Plan:  -Will plan for heparin 1500 units/hr (~ 14 units/kg/hr) beginning 8 hrs post sheath removal -Heparin level in 6 hours and daily wth CBC daily  Harland German, Pharm D 10/11/2012 2:44 PM

## 2012-10-12 LAB — HEPARIN LEVEL (UNFRACTIONATED)
Heparin Unfractionated: 0.19 IU/mL — ABNORMAL LOW (ref 0.30–0.70)
Heparin Unfractionated: 0.25 IU/mL — ABNORMAL LOW (ref 0.30–0.70)
Heparin Unfractionated: 0.38 IU/mL (ref 0.30–0.70)

## 2012-10-12 LAB — CBC
HCT: 32.8 % — ABNORMAL LOW (ref 39.0–52.0)
MCHC: 38.4 g/dL — ABNORMAL HIGH (ref 30.0–36.0)
Platelets: 211 10*3/uL (ref 150–400)
RDW: 12.4 % (ref 11.5–15.5)

## 2012-10-12 LAB — GLUCOSE, CAPILLARY
Glucose-Capillary: 202 mg/dL — ABNORMAL HIGH (ref 70–99)
Glucose-Capillary: 203 mg/dL — ABNORMAL HIGH (ref 70–99)

## 2012-10-12 MED ORDER — INSULIN ASPART 100 UNIT/ML ~~LOC~~ SOLN: 0.0000 [IU] | Freq: Three times a day (TID) | SUBCUTANEOUS | Status: DC

## 2012-10-12 MED ORDER — INSULIN ASPART 100 UNIT/ML ~~LOC~~ SOLN
0.0000 [IU] | Freq: Three times a day (TID) | SUBCUTANEOUS | Status: DC
  Administered 2012-10-12 (×2): 3 [IU] via SUBCUTANEOUS
  Administered 2012-10-13: 1 [IU] via SUBCUTANEOUS
  Administered 2012-10-13: 2 [IU] via SUBCUTANEOUS
  Administered 2012-10-14: 1 [IU] via SUBCUTANEOUS

## 2012-10-12 NOTE — Progress Notes (Signed)
ANTICOAGULATION CONSULT NOTE - Follow Up Consult  Pharmacy Consult for heparin Indication: CAD  No Known Allergies  Patient Measurements: Height: 6\' 3"  (190.5 cm) Weight: 246 lb 12.8 oz (111.948 kg) IBW/kg (Calculated) : 84.5 Heparin Dosing Weight: 107 kg  Vital Signs: Temp: 97.6 F (36.4 C) (03/22 1422) Temp src: Oral (03/22 1422) BP: 116/76 mmHg (03/22 1422) Pulse Rate: 82 (03/22 1422)  Labs:  Recent Labs  10/10/12 1036 10/10/12 1620 10/10/12 2152 10/11/12 0528 10/11/12 0723 10/12/12 0455 10/12/12 1406  HGB 14.3  --   --  13.4  --  12.6*  --   HCT 37.7*  --   --  37.1*  --  32.8*  --   PLT 253  --   --  242  --  211  --   LABPROT  --   --   --   --  13.5  --   --   INR  --   --   --   --  1.04  --   --   HEPARINUNFRC  --   --   --   --   --  0.19* 0.25*  CREATININE 0.92  --   --  0.92  --   --   --   TROPONINI <0.30 <0.30 <0.30  --   --   --   --     Estimated Creatinine Clearance: 129.8 ml/min (by C-G formula based on Cr of 0.92).   Assessment: Patient is a 52 y.o M s/p cath on 3/21 with noted 2 vessel disease. Heparin drip was resumed after cath procedure. CVTS recommends PCI over CABG d/t back issues.    Heparin level continues to be below goal despite rate increased to 1750 unit/hr this morning.  Goal of Therapy:  Heparin level 0.3-0.7 units/ml Monitor platelets by anticoagulation protocol: Yes   Plan:  1) increase heparin drip to 1950 units/hr 2) check 6 hr heparin level  Avigail Pilling P 10/12/2012,2:45 PM

## 2012-10-12 NOTE — Progress Notes (Signed)
   Subjective:  Denies CP or dyspnea   Objective:  Filed Vitals:   10/11/12 1751 10/11/12 2041 10/12/12 0547 10/12/12 1008  BP: 127/82 135/79 116/70 146/80  Pulse: 96 77 84 106  Temp:  98.7 F (37.1 C) 98.3 F (36.8 C)   TempSrc:  Oral Oral   Resp:  18 17   Height:      Weight:   246 lb 12.8 oz (111.948 kg)   SpO2:  98% 99%     Intake/Output from previous day:  Intake/Output Summary (Last 24 hours) at 10/12/12 1100 Last data filed at 10/12/12 1018  Gross per 24 hour  Intake  710.5 ml  Output      0 ml  Net  710.5 ml    Physical Exam: Physical exam: Well-developed well-nourished in no acute distress.  Skin is warm and dry.  HEENT is normal.  Neck is supple. Chest is clear to auscultation with normal expansion.  Cardiovascular exam is regular rate and rhythm.  Abdominal exam nontender or distended. No masses palpated. Extremities show no edema. neuro grossly intact    Lab Results: Basic Metabolic Panel:  Recent Labs  40/98/11 1036 10/11/12 0528  NA 131* 135  K 3.9 3.9  CL 94* 97  CO2 28 27  GLUCOSE 251* 122*  BUN 8 10  CREATININE 0.92 0.92  CALCIUM 9.8 9.3   CBC:  Recent Labs  10/10/12 1036 10/11/12 0528 10/12/12 0455  WBC 9.2 9.9 10.6*  NEUTROABS 4.3  --   --   HGB 14.3 13.4 12.6*  HCT 37.7* 37.1* 32.8*  MCV 88.9 91.8 88.4  PLT 253 242 211   Cardiac Enzymes:  Recent Labs  10/10/12 1036 10/10/12 1620 10/10/12 2152  TROPONINI <0.30 <0.30 <0.30     Assessment/Plan:  1 coronary artery disease - continue aspirin, heparin, beta blocker and statin. Dr. Zenaida Niece Trigt's consult noted. Back issues would limit rehabilitation following bypass surgery. Patient would also prefer PCI if possible. This would be problematic if drug-eluting stents required as dual antiplatelet therapy ideally would be continued for one year following placement which would delay back surgery. I will have the interventionalists review his films on Monday to decide if he is  a good candidate for bare metal stent. 2 hypertension-continue present blood pressure medications. 3 hyperlipidemia-continue statin. 4 diabetes mellitus-hold Glucophage 48 hours following catheterization.   Olga Millers 10/12/2012, 11:00 AM

## 2012-10-12 NOTE — Progress Notes (Signed)
UR Completed Clayton Buchholz Graves-Bigelow, RN,BSN [phone]  

## 2012-10-12 NOTE — Progress Notes (Signed)
ANTICOAGULATION CONSULT NOTE  Pharmacy Consult for heparin Indication: chest pain/ACS  No Known Allergies  Patient Measurements: Height: 6\' 3"  (190.5 cm) Weight: 246 lb 12.8 oz (111.948 kg) IBW/kg (Calculated) : 84.5 Heparin Dosing Weight: 107kg  Vital Signs: Temp: 98.3 F (36.8 C) (03/22 0547) Temp src: Oral (03/22 0547) BP: 116/70 mmHg (03/22 0547) Pulse Rate: 84 (03/22 0547)  Labs:  Recent Labs  10/10/12 1036 10/10/12 1620 10/10/12 2152 10/11/12 0528 10/11/12 0723 10/12/12 0455  HGB 14.3  --   --  13.4  --   --   HCT 37.7*  --   --  37.1*  --   --   PLT 253  --   --  242  --   --   LABPROT  --   --   --   --  13.5  --   INR  --   --   --   --  1.04  --   HEPARINUNFRC  --   --   --   --   --  0.19*  CREATININE 0.92  --   --  0.92  --   --   TROPONINI <0.30 <0.30 <0.30  --   --   --     Estimated Creatinine Clearance: 129.8 ml/min (by C-G formula based on Cr of 0.92).  Assessment: 52 yo male with CAD s/p cath, awaiting PCI, for heparin  Goal of Therapy:  Heparin level 0.3-0.7 units/ml Monitor platelets by anticoagulation protocol: Yes   Plan: Increase Heparin 1750 units/hr Check heparin level in 8 hours.  Geannie Risen, PharmD, BCPS   10/12/2012 5:48 AM

## 2012-10-12 NOTE — Progress Notes (Signed)
ANTICOAGULATION CONSULT NOTE - Follow Up Consult  Pharmacy Consult for heparin Indication: CAD  No Known Allergies  Patient Measurements: Height: 6\' 3"  (190.5 cm) Weight: 246 lb 12.8 oz (111.948 kg) IBW/kg (Calculated) : 84.5 Heparin Dosing Weight: 107 kg  Vital Signs: Temp: 97.5 F (36.4 C) (03/22 1549) Temp src: Oral (03/22 1549) BP: 110/62 mmHg (03/22 2120) Pulse Rate: 73 (03/22 2120)  Labs:  Recent Labs  10/10/12 1036 10/10/12 1620 10/10/12 2152 10/11/12 0528 10/11/12 0723 10/12/12 0455 10/12/12 1406 10/12/12 2050  HGB 14.3  --   --  13.4  --  12.6*  --   --   HCT 37.7*  --   --  37.1*  --  32.8*  --   --   PLT 253  --   --  242  --  211  --   --   LABPROT  --   --   --   --  13.5  --   --   --   INR  --   --   --   --  1.04  --   --   --   HEPARINUNFRC  --   --   --   --   --  0.19* 0.25* 0.38  CREATININE 0.92  --   --  0.92  --   --   --   --   TROPONINI <0.30 <0.30 <0.30  --   --   --   --   --     Estimated Creatinine Clearance: 129.8 ml/min (by C-G formula based on Cr of 0.92).   Medications:  Scheduled:  . aspirin  81 mg Oral Daily  . famotidine  10 mg Oral BID  . insulin aspart  0-9 Units Subcutaneous TID WC  . lisinopril  5 mg Oral Daily  . metoprolol  25 mg Oral BID  . nicotine  21 mg Transdermal Daily  . nitroGLYCERIN  0.5 inch Topical Q6H  . simvastatin  5 mg Oral q1800  . sodium chloride  3 mL Intravenous Q12H  . sodium chloride  3 mL Intravenous Q12H  . [DISCONTINUED] aspirin EC  81 mg Oral Daily  . [DISCONTINUED] insulin aspart  0-9 Units Subcutaneous TID WC  . [DISCONTINUED] metFORMIN  1,000 mg Oral BID WC   Infusions:  . [EXPIRED] sodium chloride 75 mL/hr at 10/11/12 1641  . heparin 1,950 Units/hr (10/12/12 1503)    Assessment: 52 yo male with CAD is currently on therapeutic heparin.  Heparin level is 0.38 Goal of Therapy:  Heparin level 0.3-0.7 units/ml Monitor platelets by anticoagulation protocol: Yes   Plan:  1) Continue  heparin at 1950 units/hr 2) heparin level and CBC in am.  Breean Nannini, Tsz-Yin 10/12/2012,9:43 PM

## 2012-10-13 DIAGNOSIS — I2510 Atherosclerotic heart disease of native coronary artery without angina pectoris: Secondary | ICD-10-CM

## 2012-10-13 LAB — GLUCOSE, CAPILLARY: Glucose-Capillary: 122 mg/dL — ABNORMAL HIGH (ref 70–99)

## 2012-10-13 LAB — CBC
HCT: 33.3 % — ABNORMAL LOW (ref 39.0–52.0)
MCHC: 36 g/dL (ref 30.0–36.0)
MCV: 91.2 fL (ref 78.0–100.0)
RDW: 12.2 % (ref 11.5–15.5)

## 2012-10-13 LAB — HEPARIN LEVEL (UNFRACTIONATED): Heparin Unfractionated: 0.51 IU/mL (ref 0.30–0.70)

## 2012-10-13 NOTE — Progress Notes (Signed)
   Subjective:  Denies CP or dyspnea   Objective:  Filed Vitals:   10/12/12 1549 10/12/12 2030 10/12/12 2120 10/13/12 0500  BP: 148/68 110/62 110/62 119/67  Pulse: 78 73 73 73  Temp: 97.5 F (36.4 C) 98 F (36.7 C)  97.5 F (36.4 C)  TempSrc: Oral     Resp: 18 20  20   Height:      Weight:      SpO2: 97% 98%  98%    Intake/Output from previous day:  Intake/Output Summary (Last 24 hours) at 10/13/12 0846 Last data filed at 10/12/12 1800  Gross per 24 hour  Intake  801.5 ml  Output      0 ml  Net  801.5 ml    Physical Exam: Physical exam: Well-developed well-nourished in no acute distress.  Skin is warm and dry.  HEENT is normal.  Neck is supple. Chest is clear to auscultation with normal expansion.  Cardiovascular exam is regular rate and rhythm.  Abdominal exam nontender or distended. No masses palpated. Extremities show no edema. neuro grossly intact    Lab Results: Basic Metabolic Panel:  Recent Labs  40/98/11 1036 10/11/12 0528  NA 131* 135  K 3.9 3.9  CL 94* 97  CO2 28 27  GLUCOSE 251* 122*  BUN 8 10  CREATININE 0.92 0.92  CALCIUM 9.8 9.3   CBC:  Recent Labs  10/10/12 1036  10/12/12 0455 10/13/12 0625  WBC 9.2  < > 10.6* 9.6  NEUTROABS 4.3  --   --   --   HGB 14.3  < > 12.6* 12.0*  HCT 37.7*  < > 32.8* 33.3*  MCV 88.9  < > 88.4 91.2  PLT 253  < > 211 209  < > = values in this interval not displayed. Cardiac Enzymes:  Recent Labs  10/10/12 1036 10/10/12 1620 10/10/12 2152  TROPONINI <0.30 <0.30 <0.30     Assessment/Plan:  1 coronary artery disease - continue aspirin, heparin, beta blocker and statin. Dr. Zenaida Niece Trigt's consult noted. Back issues would limit rehabilitation following bypass surgery. Patient would also prefer PCI if possible. This would be problematic if drug-eluting stents required as dual antiplatelet therapy ideally would be continued for one year following placement which would delay back surgery. I will have the  interventionalists review his films on Monday to decide if he is a good candidate for bare metal stent. Keep NPO after midnight 2 hypertension-continue present blood pressure medications. 3 hyperlipidemia-continue statin. 4 diabetes mellitus-hold Glucophage 48 hours following catheterization.   Clayton Foster 10/13/2012, 8:46 AM

## 2012-10-13 NOTE — Consult Note (Signed)
[address]            [place] [address]          [phone]       Clayton Foster [facility] Medical Record #[id number] Date of Birth: 1960-09-15  No ref. provider found MUSE,ROCHELLE D., PA-C  Chief Complaint:    Chief Complaint  Patient presents with  . Anxiety  . Pleurisy    History of Present Illness:     52 year old Caucasian diabetic smoker with history of CAD presents with accelerating angina with pain in his left chest radiating to his left arm. He was admitted to the emergency department with negative cardiac enzymes. A stress Myoview scan weight 2013 was negative for ischemia. EKG showed nonspecific changes. He underwent diagnostic cardiac catheterization showing moderate 2 vessel CAD. LV function is fairly well preserved.  The patient had 2 stents placed at [facility] in year 2000. This was shortly after he underwent lumbar laminectomy surgery for low back pain. The patient's low back pain has recurred and is quite severe limiting his mobility and ambulation. He has received epidural steroid injections without relief and is planning on a neurosurgery evaluation for possible redo back surgery.  His current coronary disease could be treated with PCI but a thoracic surgical evaluation was requested so the patient  could be informed about possible surgical coronary graduation. \ \The patient has no history of thoracic surgical procedures.  Current Activity/ Functional Status: Zubrod Score: At the time of surgery this patient's most appropriate activity status/level should be described as: []  Normal activity, no symptoms []  Symptoms, fully ambulatory []  Symptoms, in bed less than or equal to 50% of the time [x]  Symptoms, in bed greater than 50% of the time but less than 100% due to low back pain []  Bedridden []  Moribund     Past Medical History  Diagnosis Date  . Anxiety   . Hypertension   . Diabetes  mellitus   . Chronic back pain   . Chronic neck pain   . Coronary artery disease     Past Surgical History  Procedure Laterality Date  . Back surgery    . Appendectomy    . Tonsillectomy    . Cardiac stents      History  Smoking status  . Current Every Day Smoker -- 1.00 packs/day  . Types: Cigarettes  Smokeless tobacco  . Not on file    History  Alcohol Use No    History   Social History  . Marital Status: Divorced    Spouse Name: N/A    Number of Children: N/A  . Years of Education: N/A   Occupational History  . Unemployed    Social History Main Topics  . Smoking status: Current Every Day Smoker -- 1.00 packs/day    Types: Cigarettes  . Smokeless tobacco: Not on file  . Alcohol Use: No  . Drug Use: No  . Sexually Active: Not on file   Other Topics Concern  . Not on file   Social History Narrative  . No narrative on file    No Known Allergies  Current Facility-Administered Medications  Medication Dose Route Frequency Provider Last Rate Last Dose  . 0.9 %  sodium chloride infusion  250 mL Intravenous PRN Jodelle Gross, NP      . acetaminophen (TYLENOL) tablet 650 mg  650 mg Oral Q4H PRN Laurey Morale, MD      . aspirin chewable tablet 81 mg  81 mg Oral Daily Laurey Morale, MD   81 mg at 10/13/12 1001  . clonazePAM (KLONOPIN) tablet 0.5 mg  0.5 mg Oral Daily PRN Gwenyth Bender, NP   0.5 mg at 10/13/12 0709  . cyclobenzaprine (FLEXERIL) tablet 10 mg  10 mg Oral TID PRN Gwenyth Bender, NP   10 mg at 10/12/12 2308  . famotidine (PEPCID) tablet 10 mg  10 mg Oral BID Gwenyth Bender, NP   10 mg at 10/13/12 1001  . heparin ADULT infusion 100 units/mL (25000 units/250 mL)  1,950 Units/hr Intravenous Continuous Anh P Pham, RPH 19.5 mL/hr at 10/13/12 0237 1,950 Units/hr at 10/13/12 0237  . HYDROmorphone (DILAUDID) injection 0.5-1 mg  0.5-1 mg Intravenous Q2H PRN Vania Rea, MD   1 mg at 10/13/12 0707  . insulin aspart (novoLOG) injection 0-9 Units  0-9  Units Subcutaneous TID WC Lewayne Bunting, MD   3 Units at 10/12/12 1718  . lisinopril (PRINIVIL,ZESTRIL) tablet 5 mg  5 mg Oral Daily Jodelle Gross, NP   5 mg at 10/13/12 1001  . metoprolol tartrate (LOPRESSOR) tablet 25 mg  25 mg Oral BID Jodelle Gross, NP   25 mg at 10/13/12 1007  . nicotine (NICODERM CQ - dosed in mg/24 hours) patch 21 mg  21 mg Transdermal Daily Nimish C Gosrani, MD   21 mg at 10/13/12 1001  . nitroGLYCERIN (NITROGLYN) 2 % ointment 0.5 inch  0.5 inch Topical Q6H Jodelle Gross, NP   0.5 inch at 10/12/12 2308  . nitroGLYCERIN (NITROSTAT) SL tablet 0.4 mg  0.4 mg Sublingual Q5 min PRN Gwenyth Bender, NP      . ondansetron [facility]) injection 4 mg  4 mg Intravenous Q6H PRN Laurey Morale, MD      . oxyCODONE-acetaminophen (PERCOCET/ROXICET) 5-325 MG per tablet 1 tablet  1 tablet Oral Q4H PRN Gwenyth Bender, NP   1 tablet at 10/12/12 2121  . simvastatin (ZOCOR) tablet 5 mg  5 mg Oral q1800 Gwenyth Bender, NP   5 mg at 10/12/12 1717  . sodium chloride 0.9 % injection 3 mL  3 mL Intravenous Q12H Gwenyth Bender, NP   3 mL at 10/12/12 2120  . sodium chloride 0.9 % injection 3 mL  3 mL Intravenous Q12H Jodelle Gross, NP   3 mL at 10/13/12 1008  . sodium chloride 0.9 % injection 3 mL  3 mL Intravenous PRN Jodelle Gross, NP         Family History  Problem Relation Age of Onset  . Hypertension Father      Review of Systems:     Cardiac Review of Systems: Y or N  Chest Pain [   Y. ]  Resting SOB [ N.  ] Exertional SOB  [Y.  ]  Orthopnea [ n]   Pedal Edema [ N.  ]    Palpitations [N.  ] Syncope  [N.  ]   Presyncope [N.   ]  General Review of Systems: [Y] = yes [  ]=no Constitional: recent weight change [  ]; anorexia [  ]; fatigue [  ]; nausea [  ]; night sweats [  ]; fever [  ]; or chills [  ];  Dental: poor dentition[  ];  Last Dentist visit: 1 year  Eye : blurred vision [  ]; diplopia [   ]; vision changes [  ];  Amaurosis fugax[  ]; Resp: cough [  ];  wheezing[  ];  hemoptysis[  ]; shortness of breath[  ]; paroxysmal nocturnal dyspnea[  ]; dyspnea on exertion[  ]; or orthopnea[  ];  GI:  gallstones[  ], vomiting[  ];  dysphagia[  ]; melena[  ];  hematochezia [  ]; heartburn[  ];   Hx of  Colonoscopy[  ]; GU: kidney stones [  ]; hematuria[  ];   dysuria [  ];  nocturia[  ];  history of     obstruction [  ];                 Skin: rash, swelling[  ];, hair loss[  ];  peripheral edema[  ];  or itching[  ]; Musculosketetal: myalgias[  ];  joint swelling[  ];  joint erythema[  ];  joint pain[  ];  back pain[  ];  Heme/Lymph: bruising[  ];  bleeding[  ];  anemia[  ];  Neuro: TIA[  ];  headaches[  ];  stroke[  ];  vertigo[  ];  seizures[  ];   paresthesias[  ];  difficulty walking[Y.  ]; peripheral neuropathy from diabetes  Psych:depression[  ]; anxiety[ Y. ]; panic attacks fairly often  Endocrine: diabetes[  ];  thyroid dysfunction[  ];  Immunizations: Flu [  ]; Pneumococcal[  ];  Other:  Physical Exam: BP 151/87  Pulse 73  Temp(Src) 97.5 F (36.4 C) (Oral)  Resp 20  Ht 6\' 3"  (1.905 m)  Wt 246 lb 12.8 oz (111.948 kg)  BMI 30.85 kg/m2  SpO2 98%   Physical exam  General appearance middle-aged obese Caucasian male in bed no acute distress Recent cardiac cath with compression dressing over right wrist without hematoma HEENT normocephalic pupils equal Neck no JVD mass or bruit Lungs distant breath sounds no thoracic deformity or tenderness Cardiac regular rhythm without murmur Abdomen obese soft nontender without pulsatile mass Extremities no tenderness cyanosis Musculoskeletal-surgical incision over lumbar back Neuro alert no focal motor deficit Vascular no significant evidence of varicose veins , good peripheral perfusion  Diagnostic Studies & Laboratory data:   Cardiac cath and radiographic studies  reviewed. PFTs carotid ultrasound pending.  Recent Radiology Findings:   No results found.    Recent Lab Findings: Lab Results  Component Value Date   WBC 9.6 10/13/2012   HGB 12.0* 10/13/2012   HCT 33.3* 10/13/2012   PLT 209 10/13/2012   GLUCOSE 122* 10/11/2012   CHOL 187 10/10/2012   TRIG 173* 10/10/2012   HDL 27* 10/10/2012   LDLCALC 125* 10/10/2012   ALT 37 10/11/2012   AST 26 10/11/2012   NA 135 10/11/2012   K 3.9 10/11/2012   CL 97 10/11/2012   CREATININE 0.92 10/11/2012   BUN 10 10/11/2012   CO2 27 10/11/2012   INR 1.04 10/11/2012   HGBA1C 7.9* 07/22/2012      Assessment / Plan:     Obese diabetic smoker with progressive class IV angina Moderate 2 vessel CAD with preserved LV function Very poor functional status because of inability to walk from back pain If coronaries are treatable with PCI in this 52 year old diabetic I would recommend that as the best option at this time.  If the patient had CABG he would need to wait at least 3  months for back surgery if that would be indicated pending a future neurosurgery evaluation.  Smoking cessation discussed with patient is important component of his therapy for heart-vascular disease.

## 2012-10-13 NOTE — Progress Notes (Signed)
ANTICOAGULATION CONSULT NOTE - Follow Up Consult  Pharmacy Consult for heparin Indication: CAD  No Known Allergies  Patient Measurements: Height: 6\' 3"  (190.5 cm) Weight: 246 lb 12.8 oz (111.948 kg) IBW/kg (Calculated) : 84.5 Heparin Dosing Weight: 107 kg  Vital Signs: Temp: 97.5 F (36.4 C) (03/23 0500) BP: 119/67 mmHg (03/23 0500) Pulse Rate: 73 (03/23 0500)  Labs:  Recent Labs  10/10/12 1036 10/10/12 1620 10/10/12 2152 10/11/12 0528 10/11/12 0723  10/12/12 0455 10/12/12 1406 10/12/12 2050 10/13/12 0625  HGB 14.3  --   --  13.4  --   --  12.6*  --   --  12.0*  HCT 37.7*  --   --  37.1*  --   --  32.8*  --   --  33.3*  PLT 253  --   --  242  --   --  211  --   --  209  LABPROT  --   --   --   --  13.5  --   --   --   --   --   INR  --   --   --   --  1.04  --   --   --   --   --   HEPARINUNFRC  --   --   --   --   --   < > 0.19* 0.25* 0.38 0.51  CREATININE 0.92  --   --  0.92  --   --   --   --   --   --   TROPONINI <0.30 <0.30 <0.30  --   --   --   --   --   --   --   < > = values in this interval not displayed.  Estimated Creatinine Clearance: 129.8 ml/min (by C-G formula based on Cr of 0.92).  Assessment: Patient is a 52 y.o M s/p cath on 3/21 with plan for possible PCI.  Heparin level remains at goal this morning after rate increased to 1950 units/hr yesterday.  Goal of Therapy:  Heparin level 0.3-0.7 units/ml Monitor platelets by anticoagulation protocol: Yes   Plan:  1) continue current heparin regimen  Letoya Stallone P 10/13/2012,7:34 AM

## 2012-10-14 ENCOUNTER — Encounter (HOSPITAL_COMMUNITY)
Admission: EM | Disposition: A | Discharge status: Home / Self Care | Payer: Self-pay | Source: Home / Self Care | Attending: Cardiology

## 2012-10-14 DIAGNOSIS — I2510 Atherosclerotic heart disease of native coronary artery without angina pectoris: Secondary | ICD-10-CM

## 2012-10-14 DIAGNOSIS — I200 Unstable angina: Secondary | ICD-10-CM

## 2012-10-14 HISTORY — PX: PERCUTANEOUS CORONARY STENT INTERVENTION (PCI-S): SHX5485

## 2012-10-14 LAB — BASIC METABOLIC PANEL
BUN: 9 mg/dL (ref 6–23)
Calcium: 9.1 mg/dL (ref 8.4–10.5)
Creatinine, Ser: 0.83 mg/dL (ref 0.50–1.35)
GFR calc Af Amer: >90 mL/min (ref >90)
GFR calc non Af Amer: >90 mL/min (ref >90)
Glucose, Bld: 123 mg/dL — ABNORMAL HIGH (ref 70–99)

## 2012-10-14 LAB — CBC
HCT: 31.7 % — ABNORMAL LOW (ref 39.0–52.0)
Hemoglobin: 11.6 g/dL — ABNORMAL LOW (ref 13.0–17.0)
MCH: 33.6 pg (ref 26.0–34.0)
MCHC: 36.6 g/dL — ABNORMAL HIGH (ref 30.0–36.0)
MCV: 91.9 fL (ref 78.0–100.0)
RDW: 12.3 % (ref 11.5–15.5)

## 2012-10-14 LAB — GLUCOSE, CAPILLARY: Glucose-Capillary: 146 mg/dL — ABNORMAL HIGH (ref 70–99)

## 2012-10-14 LAB — HEPARIN LEVEL (UNFRACTIONATED): Heparin Unfractionated: 0.54 IU/mL (ref 0.30–0.70)

## 2012-10-14 SURGERY — CORONARY ARTERY BYPASS GRAFTING (CABG)
Anesthesia: General | Site: Chest

## 2012-10-14 SURGERY — PERCUTANEOUS CORONARY STENT INTERVENTION (PCI-S)
Anesthesia: LOCAL

## 2012-10-14 MED ORDER — NITROGLYCERIN 1 MG/10 ML FOR IR/CATH LAB
INTRA_ARTERIAL | Status: AC
  Filled 2012-10-14: qty 10

## 2012-10-14 MED ORDER — ASPIRIN 81 MG PO CHEW: 81.0000 mg | CHEWABLE_TABLET | Freq: Every day | ORAL | Status: DC

## 2012-10-14 MED ORDER — SODIUM CHLORIDE 0.9 % IJ SOLN: 3.0000 mL | Freq: Two times a day (BID) | INTRAMUSCULAR | Status: DC

## 2012-10-14 MED ORDER — CLOPIDOGREL BISULFATE 75 MG PO TABS
600.0000 mg | ORAL_TABLET | Freq: Once | ORAL | Status: AC
  Administered 2012-10-14: 600 mg via ORAL
  Filled 2012-10-14: qty 6

## 2012-10-14 MED ORDER — ONDANSETRON HCL 4 MG/2ML IJ SOLN: 4.0000 mg | Freq: Four times a day (QID) | INTRAMUSCULAR | Status: DC | PRN

## 2012-10-14 MED ORDER — MIDAZOLAM HCL 2 MG/2ML IJ SOLN
INTRAMUSCULAR | Status: AC
  Filled 2012-10-14: qty 2

## 2012-10-14 MED ORDER — SODIUM CHLORIDE 0.9 % IV SOLN
0.2500 mg/kg/h | INTRAVENOUS | Status: DC
  Filled 2012-10-14 (×2): qty 250

## 2012-10-14 MED ORDER — FENTANYL CITRATE 0.05 MG/ML IJ SOLN
INTRAMUSCULAR | Status: AC
  Filled 2012-10-14: qty 2

## 2012-10-14 MED ORDER — SODIUM CHLORIDE 0.9 % IV SOLN
1.0000 mL/kg/h | INTRAVENOUS | Status: AC
  Administered 2012-10-14: 15:00:00 1 mL/kg/h via INTRAVENOUS

## 2012-10-14 MED ORDER — HEPARIN (PORCINE) IN NACL 2-0.9 UNIT/ML-% IJ SOLN
INTRAMUSCULAR | Status: AC
  Filled 2012-10-14: qty 1000

## 2012-10-14 MED ORDER — LIDOCAINE HCL (PF) 1 % IJ SOLN
INTRAMUSCULAR | Status: AC
  Filled 2012-10-14: qty 30

## 2012-10-14 MED ORDER — BIVALIRUDIN 250 MG IV SOLR
INTRAVENOUS | Status: AC
  Filled 2012-10-14: qty 250

## 2012-10-14 MED ORDER — ACETAMINOPHEN 325 MG PO TABS: 650.0000 mg | ORAL_TABLET | ORAL | Status: DC | PRN

## 2012-10-14 MED ORDER — SODIUM CHLORIDE 0.9 % IV SOLN: 250.0000 mL | INTRAVENOUS | Status: DC | PRN

## 2012-10-14 MED ORDER — CLOPIDOGREL BISULFATE 75 MG PO TABS
75.0000 mg | ORAL_TABLET | Freq: Every day | ORAL | Status: DC
  Administered 2012-10-15: 08:00:00 75 mg via ORAL
  Filled 2012-10-14: qty 1

## 2012-10-14 MED ORDER — SODIUM CHLORIDE 0.9 % IJ SOLN: 3.0000 mL | INTRAMUSCULAR | Status: DC | PRN

## 2012-10-14 MED ORDER — SODIUM CHLORIDE 0.9 % IV SOLN: INTRAVENOUS | Status: DC

## 2012-10-14 MED ORDER — HYDROMORPHONE HCL PF 1 MG/ML IJ SOLN
1.0000 mg | INTRAMUSCULAR | Status: DC | PRN
  Administered 2012-10-14 – 2012-10-15 (×4): 1 mg via INTRAVENOUS
  Filled 2012-10-14 (×4): qty 1

## 2012-10-14 MED ORDER — DIAZEPAM 5 MG PO TABS
5.0000 mg | ORAL_TABLET | ORAL | Status: AC
  Administered 2012-10-14: 5 mg via ORAL
  Filled 2012-10-14: qty 1

## 2012-10-14 MED ORDER — ASPIRIN 81 MG PO CHEW: 324.0000 mg | CHEWABLE_TABLET | ORAL | Status: DC

## 2012-10-14 MED ORDER — VERAPAMIL HCL 2.5 MG/ML IV SOLN
INTRAVENOUS | Status: AC
  Filled 2012-10-14: qty 2

## 2012-10-14 NOTE — H&P (View-Only) (Signed)
  Subjective:  No chest pain or dyspnea overnight. The patient is n.p.o.  Objective:  Vital Signs in the last 24 hours: Temp:  [97.2 F (36.2 C)-97.9 F (36.6 C)] 97.3 F (36.3 C) (03/24 0500) Pulse Rate:  [62-76] 62 (03/24 0934) Resp:  [18-20] 18 (03/24 0500) BP: (115-151)/(69-87) 127/73 mmHg (03/24 0934) SpO2:  [97 %-99 %] 97 % (03/24 0500)  Intake/Output from previous day: 03/23 0701 - 03/24 0700 In: 3 [I.V.:3] Out: -   Physical Exam: Pt is alert and oriented, NAD HEENT: normal Neck: JVP - normal, carotids 2+= without bruits Lungs: CTA bilaterally CV: RRR withgrade 2/6 systolic ejection murmur at the left upper sternal border Abd: soft, NT, Positive BS, no hepatomegaly Ext: no C/C/E, distal pulses intact and equal, right radial site is clear Skin: warm/dry no rash  Lab Results:  Recent Labs  10/13/12 0625 10/14/12 0648  WBC 9.6 8.6  HGB 12.0* 11.6*  PLT 209 202    Recent Labs  10/14/12 0648  NA 142  K 4.1  CL 107  CO2 24  GLUCOSE 123*  BUN 9  CREATININE 0.83   No results found for this basename: TROPONINI, CK, MB,  in the last 72 hours  Cardiac Studies: Cardiac Cath: Indication: Unstable angina  Procedural Details: The right wrist was prepped, draped, and anesthetized with 1% lidocaine. Using the modified Seldinger technique, a 5 French sheath was introduced into the right radial artery. 3 mg of verapamil was administered through the sheath, weight-based unfractionated heparin was administered intravenously. Standard Judkins catheters were used for selective coronary angiography and left ventriculography. Catheter exchanges were performed over an exchange length guidewire. There were no immediate procedural complications. A TR band was used for radial hemostasis at the completion of the procedure. The patient was transferred to the post catheterization recovery area for further monitoring.  Procedural Findings:  Hemodynamics:  AO 99/66  LV 110/16    Coronary angiography:  Coronary dominance: right  Left mainstem: 30% distal tapering of the LM.  Left anterior descending (LAD): There is a stent in the proximal LAD with diffuse up to 40% in-stent restenosis. 40% mid LAD stenosis at takeoff of a moderate diagonal. This was followed by a 95% mid LAD stenosis.  Left circumflex (LCx): 30% proximal LCx at OM1 takeoff. Luminal irregularities throughout the remainder of the LCx system.  Right coronary artery (RCA): There is a stent in the mid RCA. 80% stenosis in the proximal RCA prior to stent. Serial 40% mid RCA stenoses. The PDA is relatively small with an 80% mid vessel stenosis.  Left ventriculography: Left ventricular systolic function is normal, LVEF is estimated at 60-65%, there is no significant wall motion abnormality.  Final Conclusions: Most significant stenoses are 80% proximal RCA stenosis and 95% mid LAD stenosis. The stents in the proximal LAD and the RCA are patent with < 50% in-stent restenosis. Lesions can be approached percutaneously. Patient states that he will need back surgery in the near future. I told him that if we did PCI, I would want to use DES requiring 1 year of DAPT prior to surgery. I told him that CABG would be an alternative. He does not think that he wants CABG. He wants to think things over. I will discuss further with his mother and him. If he opts for PCI, will arrange for Monday.   Tele: Sinus rhythm without significant arrhythmia, personally reviewed.  Assessment/Plan:  1. Unstable angina 2.type 2 diabetes 3. Essential hypertension 4. Hyperlipidemia    I have reviewed the patient's cardiac catheterization films. Difficult situation considering his need for back surgery. We had a prolonged discussion today about his treatment options. He has been evaluated by cardiac surgery and felt that this was not a good option for him.I contacted his spine specialist at [facility]. We have all agreed that  he can wait for back surgery with appropriate pain management in the interim. He has been dealing with low back problems now for several years. Will plan on PCI of the right coronary artery and mid LAD today. I have reviewed the risks, indications, and alternatives to PCI with the patient. He will need to wait at least 6 months for interruption of his dual antiplatelet therapy. I will load him with Plavix 600 mg this morning.  Time spent with patient and in management of his care greater than 40 minutes.  Gayathri Futrell, M.D. 10/14/2012, 9:50 AM     

## 2012-10-14 NOTE — Progress Notes (Signed)
Subjective:  No chest pain or dyspnea overnight. The patient is n.p.o.  Objective:  Vital Signs in the last 24 hours: Temp:  [97.2 F (36.2 C)-97.9 F (36.6 C)] 97.3 F (36.3 C) (03/24 0500) Pulse Rate:  [62-76] 62 (03/24 0934) Resp:  [18-20] 18 (03/24 0500) BP: (115-151)/(69-87) 127/73 mmHg (03/24 0934) SpO2:  [97 %-99 %] 97 % (03/24 0500)  Intake/Output from previous day: 03/23 0701 - 03/24 0700 In: 3 [I.V.:3] Out: -   Physical Exam: Pt is alert and oriented, NAD HEENT: normal Neck: JVP - normal, carotids 2+= without bruits Lungs: CTA bilaterally CV: RRR withgrade 2/6 systolic ejection murmur at the left upper sternal border Abd: soft, NT, Positive BS, no hepatomegaly Ext: no C/C/E, distal pulses intact and equal, right radial site is clear Skin: warm/dry no rash  Lab Results:  Recent Labs  10/13/12 0625 10/14/12 0648  WBC 9.6 8.6  HGB 12.0* 11.6*  PLT 209 202    Recent Labs  10/14/12 0648  NA 142  K 4.1  CL 107  CO2 24  GLUCOSE 123*  BUN 9  CREATININE 0.83   No results found for this basename: TROPONINI, CK, MB,  in the last 72 hours  Cardiac Studies: Cardiac Cath: Indication: Unstable angina  Procedural Details: The right wrist was prepped, draped, and anesthetized with 1% lidocaine. Using the modified Seldinger technique, a 5 French sheath was introduced into the right radial artery. 3 mg of verapamil was administered through the sheath, weight-based unfractionated heparin was administered intravenously. Standard Judkins catheters were used for selective coronary angiography and left ventriculography. Catheter exchanges were performed over an exchange length guidewire. There were no immediate procedural complications. A TR band was used for radial hemostasis at the completion of the procedure. The patient was transferred to the post catheterization recovery area for further monitoring.  Procedural Findings:  Hemodynamics:  AO 99/66  LV 110/16    Coronary angiography:  Coronary dominance: right  Left mainstem: 30% distal tapering of the LM.  Left anterior descending (LAD): There is a stent in the proximal LAD with diffuse up to 40% in-stent restenosis. 40% mid LAD stenosis at takeoff of a moderate diagonal. This was followed by a 95% mid LAD stenosis.  Left circumflex (LCx): 30% proximal LCx at OM1 takeoff. Luminal irregularities throughout the remainder of the LCx system.  Right coronary artery (RCA): There is a stent in the mid RCA. 80% stenosis in the proximal RCA prior to stent. Serial 40% mid RCA stenoses. The PDA is relatively small with an 80% mid vessel stenosis.  Left ventriculography: Left ventricular systolic function is normal, LVEF is estimated at 60-65%, there is no significant wall motion abnormality.  Final Conclusions: Most significant stenoses are 80% proximal RCA stenosis and 95% mid LAD stenosis. The stents in the proximal LAD and the RCA are patent with < 50% in-stent restenosis. Lesions can be approached percutaneously. Patient states that he will need back surgery in the near future. I told him that if we did PCI, I would want to use DES requiring 1 year of DAPT prior to surgery. I told him that CABG would be an alternative. He does not think that he wants CABG. He wants to think things over. I will discuss further with his mother and him. If he opts for PCI, will arrange for Monday.   Tele: Sinus rhythm without significant arrhythmia, personally reviewed.  Assessment/Plan:  1. Unstable angina 2.type 2 diabetes 3. Essential hypertension 4. Hyperlipidemia  I have reviewed the patient's cardiac catheterization films. Difficult situation considering his need for back surgery. We had a prolonged discussion today about his treatment options. He has been evaluated by cardiac surgery and felt that this was not a good option for him.I contacted his spine specialist at [facility]. We have all agreed that  he can wait for back surgery with appropriate pain management in the interim. He has been dealing with low back problems now for several years. Will plan on PCI of the right coronary artery and mid LAD today. I have reviewed the risks, indications, and alternatives to PCI with the patient. He will need to wait at least 6 months for interruption of his dual antiplatelet therapy. I will load him with Plavix 600 mg this morning.  Time spent with patient and in management of his care greater than 40 minutes.  Tonny Bollman, M.D. 10/14/2012, 9:50 AM

## 2012-10-14 NOTE — CV Procedure (Signed)
CARDIAC CATH NOTE  Name: Clayton Foster MRN: [id number] DOB: [birth year]  Procedure: PTCA and stenting of the proximal and distal RCA, PTCA and stenting of the mid LAD.  Indication: Unstable angina. This is a 52 year old diabetic male with diabetes and coronary artery disease. He has undergone previous stenting. He presented with unstable angina and underwent diagnostic cardiac catheterization. This demonstrated severe two-vessel coronary artery disease in the right coronary artery and LAD. 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 He was evaluated by cardiac surgery, and PCI was ultimately recommended.  Procedural Details: The right wrist was prepped, draped, and anesthetized with 1% lidocaine. Using the modified Seldinger technique, a 6 Fr sheath was introduced into the radial artery. 3 mg verapamil was administered through the radial sheath. Weight-based bivalirudin was given for anticoagulation. The patient had been adequately preloaded with Plavix 600 mg. Attention was first turned to the right coronary artery. There is severe stenosis in the proximal and distal vessel. The stented segment in the mid vessel with patent. Once a therapeutic ACT was achieved, a 6 [place] JR 4 guide catheter was inserted.  A pro-water coronary guidewire was used to cross the lesion.  Both lesions were predilated with a 2.25 x 15 mm balloon.  The distal lesion was then stented with a 2.75 x 28 mm Promus premier drug-eluting stent.  The stent was postdilated with a 3.0 x 20 mm noncompliant balloon.  Following PCI, there was 0% residual stenosis and TIMI-3 flow. The proximal RCA was then stented with a 3.0 x 24 mm Promus premier drug-eluting stent. The stent was postdilated with a 3.5 x 20 mm noncompliant balloon to high pressure. There was 0% residual stenosis and TIMI-3 flow post PCI.  Attention was then turned to the LAD. There was moderate proximal vessel stenosis. There was severe  stenosis in the distal vessel just after the second diagonal branch. An XB LAD 3.5 cm guide catheter was initially utilized for the angioplasty portion of the procedure. However, guide position was lost trying to pass the stent. The guide was changed out for an XB LAD 4 cm catheter. This provided better support. The lesion was initially wired with a pro-water wire. After guide position was lost, a grand slam wire was utilized. The lesion was predilated with a 2.0 x 15 mm balloon. The lesion was stented with a 2.25 x 20 mm Promus premier drug-eluting stent. The stent was postdilated with a 2.5 mm noncompliant balloon. There was 0% residual stenosis and TIMI-3 flow at the completion of the procedure. Final angiography confirmed an excellent result at all lesion sites. The patient tolerated the procedure well. There were no immediate procedural complications. A TR band was used for radial hemostasis. The patient was transferred to the post catheterization recovery area for further monitoring.  Lesion Data: Lesion 1 Vessel: RCA/distal Percent stenosis (pre): 80 TIMI-flow (pre):  3 Stent:  2.75 x 28 mm drug-eluting Percent stenosis (post): 0 TIMI-flow (post): 3  Lesion 2 Vessel: RCA/proximal Percent stenosis (pre): 90 TIMI-flow (pre):  3 Stent:  3.0 x 24 mm drug-eluting Percent stenosis (post): 0 TIMI-flow (post): 3  Lesion 3 Vessel: LAD/mid Percent stenosis (pre): 99 TIMI-flow (pre):  3 Stent:  2.25 x 20 mm drug-eluting Percent stenosis (post): 0 TIMI-flow (post):  3   Conclusions: Successful 2 vessel PCI as detailed above  Recommendations: Dual antiplatelet therapy with aspirin and Plavix for a minimum of 6 months. Ideally the patient would take 12 months of  aspirin and Plavix, but if he requires surgery on an urgent basis, 6 months would be reasonable considering second-generation drug-eluting stent platform  were utilized.  Tonny Bollman 10/14/2012, 12:41 PM

## 2012-10-14 NOTE — Progress Notes (Signed)
TR BAND REMOVAL  LOCATION:  right radial  DEFLATED PER PROTOCOL:  yes  TIME BAND OFF / DRESSING APPLIED:   1730   SITE UPON ARRIVAL:   Level 0  SITE AFTER BAND REMOVAL:  Level 0  REVERSE ALLEN'S TEST:    positive  CIRCULATION SENSATION AND MOVEMENT:  Within Normal Limits  yes  COMMENTS:    

## 2012-10-14 NOTE — Interval H&P Note (Signed)
History and Physical Interval Note:  10/14/2012 11:11 AM  Clayton Foster  has presented today for surgery, with the diagnosis of blockage  The various methods of treatment have been discussed with the patient and family. After consideration of risks, benefits and other options for treatment, the patient has consented to  Procedure(s): PERCUTANEOUS CORONARY STENT INTERVENTION (PCI-S) (N/A) as a surgical intervention .  The patient's history has been reviewed, patient examined, no change in status, stable for surgery.  I have reviewed the patient's chart and labs.  Questions were answered to the patient's satisfaction.     Clayton Foster

## 2012-10-14 NOTE — Progress Notes (Signed)
ANTICOAGULATION CONSULT NOTE - Follow Up Consult  Pharmacy Consult for heparin Indication: CAD  No Known Allergies  Patient Measurements: Height: 6\' 3"  (190.5 cm) Weight: 246 lb 12.8 oz (111.948 kg) IBW/kg (Calculated) : 84.5 Heparin Dosing Weight: 107 kg  Vital Signs: Temp: 97.3 F (36.3 C) (03/24 0500) BP: 127/73 mmHg (03/24 0934) Pulse Rate: 62 (03/24 0934)  Labs:  Recent Labs  10/12/12 0455  10/12/12 2050 10/13/12 0625 10/14/12 0648  HGB 12.6*  --   --  12.0* 11.6*  HCT 32.8*  --   --  33.3* 31.7*  PLT 211  --   --  209 202  HEPARINUNFRC 0.19*  < > 0.38 0.51 0.54  CREATININE  --   --   --   --  0.83  < > = values in this interval not displayed.  Estimated Creatinine Clearance: 143.8 ml/min (by C-G formula based on Cr of 0.83).  Assessment: Patient is a 52 y.o M s/p cath on 3/21 with plan for possible PCI 3/25.  Heparin level remains at goal this morning will load with 600mg  of plavix today 10/14/2012.  Goal of Therapy:  Heparin level 0.3-0.7 units/ml Monitor platelets by anticoagulation protocol: Yes   Plan:  1) continue current heparin regimen  Sheppard Coil PharmD., BCPS Clinical Pharmacist Pager [phone] 10/14/2012 9:54 AM

## 2012-10-14 NOTE — Care Management Note (Unsigned)
    Page 1 of 1   10/14/2012     10:42:31 AM   CARE MANAGEMENT NOTE 10/14/2012  Patient:  Clayton Foster, Clayton Foster   Account Number:  [id number]  Date Initiated:  10/14/2012  Documentation initiated by:  GRAVES-BIGELOW,Opel Lejeune  Subjective/Objective Assessment:   Pt admited with cp. Post cath- plan for PCI 10-14-12     Action/Plan:   CM will continue to monitor for disposition needs.   Anticipated DC Date:  10/15/2012   Anticipated DC Plan:  HOME/SELF CARE      DC Planning Services  CM consult      Choice offered to / List presented to:             Status of service:  In process, will continue to follow Medicare Important Message given?   (If response is "NO", the following Medicare IM given date fields will be blank) Date Medicare IM given:   Date Additional Medicare IM given:    Discharge Disposition:    Per UR Regulation:  Reviewed for med. necessity/level of care/duration of stay  If discussed at Long Length of Stay Meetings, dates discussed:    Comments:

## 2012-10-15 ENCOUNTER — Encounter (HOSPITAL_COMMUNITY): Payer: Self-pay | Admitting: Nurse Practitioner

## 2012-10-15 ENCOUNTER — Telehealth: Payer: Self-pay | Admitting: Adult Health

## 2012-10-15 LAB — BASIC METABOLIC PANEL
BUN: 9 mg/dL (ref 6–23)
Calcium: 9.1 mg/dL (ref 8.4–10.5)
Chloride: 108 mEq/L (ref 96–112)
Creatinine, Ser: 0.76 mg/dL (ref 0.50–1.35)
GFR calc Af Amer: >90 mL/min (ref >90)
GFR calc non Af Amer: >90 mL/min (ref >90)

## 2012-10-15 LAB — CBC
HCT: 32.9 % — ABNORMAL LOW (ref 39.0–52.0)
MCH: 33 pg (ref 26.0–34.0)
MCHC: 36.8 g/dL — ABNORMAL HIGH (ref 30.0–36.0)
MCV: 89.6 fL (ref 78.0–100.0)
Platelets: 203 10*3/uL (ref 150–400)
RDW: 12.3 % (ref 11.5–15.5)

## 2012-10-15 MED ORDER — LIVING WELL WITH DIABETES BOOK
Freq: Once | Status: DC
  Filled 2012-10-15 (×2): qty 1

## 2012-10-15 MED ORDER — CLOPIDOGREL BISULFATE 75 MG PO TABS: 75.0000 mg | ORAL_TABLET | Freq: Every day | ORAL | Status: DC

## 2012-10-15 MED ORDER — NICOTINE 21 MG/24HR TD PT24: 1.0000 | MEDICATED_PATCH | Freq: Every day | TRANSDERMAL | Status: DC

## 2012-10-15 MED ORDER — OXYCODONE-ACETAMINOPHEN 5-325 MG PO TABS: 1.0000 | ORAL_TABLET | Freq: Four times a day (QID) | ORAL | Status: DC | PRN

## 2012-10-15 MED ORDER — METOPROLOL TARTRATE 25 MG PO TABS: 25.0000 mg | ORAL_TABLET | Freq: Two times a day (BID) | ORAL | Status: DC

## 2012-10-15 MED ORDER — METFORMIN HCL 1000 MG PO TABS: 1000.0000 mg | ORAL_TABLET | Freq: Two times a day (BID) | ORAL | Status: DC

## 2012-10-15 NOTE — Plan of Care (Signed)
Problem: Discharge Progression Outcomes Goal: Other Discharge Outcomes/Goals Outcome: Completed/Met Date Met:  10/15/12 Diabetes education given.

## 2012-10-15 NOTE — Telephone Encounter (Signed)
TRANSITION OF CARE DISCHARGED TODAY 10/15/12.[facility] FOR APPT WITH KL ON 10/22/12/TMJ

## 2012-10-15 NOTE — Progress Notes (Signed)
    Subjective:  No CP or dyspnea.  Objective:  Vital Signs in the last 24 hours: Temp:  [97.4 F (36.3 C)-97.9 F (36.6 C)] 97.9 F (36.6 C) (03/25 0729) Pulse Rate:  [59-85] 78 (03/25 0729) BP: (121-171)/(54-91) 152/91 mmHg (03/25 0729) SpO2:  [96 %-100 %] 100 % (03/25 0729) Weight:  [112.4 kg (247 lb 12.8 oz)] 112.4 kg (247 lb 12.8 oz) (03/25 0516)  Intake/Output from previous day: 03/24 0701 - 03/25 0700 In: 1375.2 [P.O.:480; I.V.:895.2] Out: 3450 [Urine:3450]  Physical Exam: Pt is alert and oriented, NAD HEENT: normal Neck: JVP - normal, carotids 2+= without bruits Lungs: CTA bilaterally CV: RRR without murmur or gallop Abd: soft, NT, Positive BS, no hepatomegaly Ext: no C/C/E, distal pulses intact and equal Skin: warm/dry no rash   Lab Results:  Recent Labs  10/14/12 0648 10/15/12 0500  WBC 8.6 8.8  HGB 11.6* 12.1*  PLT 202 203    Recent Labs  10/14/12 0648 10/15/12 0500  NA 142 143  K 4.1 3.9  CL 107 108  CO2 24 24  GLUCOSE 123* 127*  BUN 9 9  CREATININE 0.83 0.76   No results found for this basename: TROPONINI, CK, MB,  in the last 72 hours  Tele: Sinus brady, sinus rhythm, no arrhythmias  Assessment/Plan:  1. Unstable Angina: S/P multivessel PCI with DES. Stable for discharge home today. Plan ASA/plavix at least 6 months. Ideally would take for greater than one year, but if he needs to proceed with back surgery at 6 months that would be reasonable considering second generation DES.  2. HTN - continue metoprolol and lisinopril  3. Hyperlipidemia - continue low-dose simvastatin  4. Chronic back pain - severe. F/U with neurosurgery at [facility] as scheduled. OK to write short-term Rx for Percocet 5/325, 1-2 every 6 hours prn pain, # 30 no refills.  5. Tobacco - cessation counseling again done.  6. Dispo - home today. F/U  1-2 weeks.   Tonny Bollman, M.D. 10/15/2012, 7:39 AM

## 2012-10-15 NOTE — Discharge Summary (Signed)
Patient ID: Clayton Foster,  MRN: [id number], DOB/AGE: [birth year] 51 y.o.  Admit date: 10/10/2012 Discharge date: 10/15/2012  Primary Care Provider: MUSE,ROCHELLE D. Primary Cardiologist: R. Rothbart, MD  Discharge Diagnoses Principal Problem:   Intermediate coronary syndrome  **S/p successful PCI/DES to the LAD and RCA x 2, this admission. Active Problems:   CAD (coronary artery disease)   Diabetes mellitus, type 2   Hypertension   High cholesterol   Tobacco use   Back pain with radiculopathy   Panic disorder   GERD (gastroesophageal reflux disease)   Hyponatremia  Allergies No Known Allergies  Procedures  Cardiac Catheterization 10/11/2012  Procedural Findings: Hemodynamics: AO 99/66 LV 110/16  Coronary angiography: Coronary dominance: right  Left mainstem: 30% distal tapering of the LM.    Left anterior descending (LAD): There is a stent in the proximal LAD with diffuse up to 40% in-stent restenosis.  40% mid LAD stenosis at takeoff of a moderate diagonal.   This was followed by a 95% mid LAD stenosis.   Left circumflex (LCx): 30% proximal LCx at OM1 takeoff.  Luminal irregularities throughout the remainder of the LCx system.   Right coronary artery (RCA): There is a stent in the mid RCA.  80% stenosis in the proximal RCA prior to stent.  Serial 40% mid RCA stenoses. The PDA is relatively small with an 80% mid vessel stenosis.    Left ventriculography: Left ventricular systolic function is normal, LVEF is estimated at 60-65%, there is no significant wall motion abnormality. _____________  Percutaneous Coronary Intervention 10/13/2012  Lesion 1 Vessel: RCA/distal Percent stenosis (pre): 80 TIMI-flow (pre):  3 Stent:  2.75 x 28 mm Promus Premier DES Percent stenosis (post): 0 TIMI-flow (post): 3  Lesion 2 Vessel: RCA/proximal Percent stenosis (pre): 90 TIMI-flow (pre):  3 Stent:  3.0 x 24 mm  Promus Premier DES Percent stenosis (post): 0 TIMI-flow (post):  3  Lesion 3 Vessel: LAD/mid Percent stenosis (pre): 99 TIMI-flow (pre):  3 Stent:  2.25 x 20 mm  Promus Premier DES Percent stenosis (post): 0 TIMI-flow (post):  3  _____________  History of Present Illness  52 year old male with prior history of CAD status post prior stenting of the right coronary artery and LAD in 2000 at [facility].  He subsequently have a negative stress test in December 2013. He was in his usual state of health until approximately 5 days prior to admission when he began to experience nocturnal substernal chest discomfort with radiation to the left arm. Due to recurrence of symptoms over the subsequent nights, he presented to the [facility] emergency department on March 20, her cardiac markers were negative an ECG was nonacute. He was evaluated by cardiology and decision was made to transfer him to [facility] for further evaluation.  Hospital Course  Patient ruled out for myocardial infarction. He underwent diagnostic cardiac catheterization on March 21 revealing severe LAD and right coronary artery disease with patent previously placed stents in both locations. Patient has a history of severe low back pain and is awaiting surgical evaluation. As result, there was some hesitance to commit him to long-term dual antiplatelet therapy. Given his significant disease, thoracic surgery was consulted and felt that given his relative youth and poor functional status, percutaneous intervention was a better option at this time. Patient was taken back to the cardiac cath lab on March 23, and underwent successful PCI and drug-eluting stent placement to the mid LAD as well as to the proximal and distal  right coronary artery. Patient tolerated procedure well and post procedure has been ambulating without recurrent symptoms chest pain or dyspnea though he does have chronic back pain. He will require dual antiplatelet therapy for a minimum of 6 months. Ideally he would take  for one year however if he is required to undergo back surgery it would be reasonable to come off at 6 months considering that he is second generation drug-eluting stents in place. He will be discharged home today in good condition and will followup in our [place] office in one week.  Discharge Vitals Blood pressure 167/93, pulse 78, temperature 97.9 F (36.6 C), temperature source Oral, resp. rate 18, height 6\' 3"  (1.905 m), weight 247 lb 12.8 oz (112.4 kg), SpO2 100.00%.  Filed Weights   10/10/12 1400 10/12/12 0547 10/15/12 0516  Weight: 247 lb (112.038 kg) 246 lb 12.8 oz (111.948 kg) 247 lb 12.8 oz (112.4 kg)   Labs  CBC  Recent Labs  10/14/12 0648 10/15/12 0500  WBC 8.6 8.8  HGB 11.6* 12.1*  HCT 31.7* 32.9*  MCV 91.9 89.6  PLT 202 203   Basic Metabolic Panel  Recent Labs  10/14/12 0648 10/15/12 0500  NA 142 143  K 4.1 3.9  CL 107 108  CO2 24 24  GLUCOSE 123* 127*  BUN 9 9  CREATININE 0.83 0.76  CALCIUM 9.1 9.1   Liver Function Tests Lab Results  Component Value Date   ALT 37 10/11/2012   AST 26 10/11/2012   ALKPHOS 82 10/11/2012   BILITOT 0.3 10/11/2012   Cardiac Enzymes Lab Results  Component Value Date   TROPONINI <0.30 10/10/2012   Fasting Lipid Panel Lab Results  Component Value Date   CHOL 187 10/10/2012   HDL 27* 10/10/2012   LDLCALC 125* 10/10/2012   TRIG 173* 10/10/2012   CHOLHDL 6.9 10/10/2012   Disposition  Pt is being discharged home today in good condition.  Follow-up Plans & Appointments  Follow-up Information   Follow up with Joni Reining, NP On 10/22/2012. (2:00 PM)    Contact information:   [address] [place] [address] [phone]       Follow up with MUSE,ROCHELLE D., PA-C. (as scheduled)    Contact information:   [address] [place] [address] [phone]     Discharge Medications    Medication List    TAKE these medications       acetaminophen 500 MG tablet  Commonly known as:  TYLENOL  Take  500-1,000 mg by mouth daily as needed for pain.     aspirin EC 81 MG tablet  Take 81 mg by mouth every morning.     clonazePAM 0.5 MG tablet  Commonly known as:  KLONOPIN  Take 0.5 mg by mouth daily as needed for anxiety. Anxiety     clopidogrel 75 MG tablet  Commonly known as:  PLAVIX  Take 1 tablet (75 mg total) by mouth daily with breakfast.     cyclobenzaprine 10 MG tablet  Commonly known as:  FLEXERIL  Take 10 mg by mouth 3 (three) times daily as needed. Muscle Spasms     fish oil-omega-3 fatty acids 1000 MG capsule  Take 2 g by mouth daily.     metFORMIN 1000 MG tablet  Commonly known as:  GLUCOPHAGE  Take 1 tablet (1,000 mg total) by mouth 2 (two) times daily with a meal.     metoprolol tartrate 25 MG tablet  Commonly known as:  LOPRESSOR  Take  1 tablet (25 mg total) by mouth 2 (two) times daily.     nicotine 21 mg/24hr patch  Commonly known as:  NICODERM CQ - dosed in mg/24 hours  Place 1 patch onto the skin daily. Use 21mg  patch x 6 wks, then 14mg  patch x 2 wks, then 7mg  patch x 2 wks.     nitroGLYCERIN 0.4 MG SL tablet  Commonly known as:  NITROSTAT  Place 1 tablet (0.4 mg total) under the tongue every 5 (five) minutes as needed for chest pain.     oxyCODONE-acetaminophen 5-325 MG per tablet  Commonly known as:  PERCOCET/ROXICET  Take 1 tablet by mouth every 6 (six) hours as needed. Pain     pravastatin 40 MG tablet  Commonly known as:  PRAVACHOL  Take 40 mg by mouth every evening.     quinapril-hydrochlorothiazide 20-25 MG per tablet  Commonly known as:  ACCURETIC  Take 1 tablet by mouth every morning.     ranitidine 150 MG tablet  Commonly known as:  ZANTAC  Take 150 mg by mouth 2 (two) times daily.      Outstanding Labs/Studies  None  Duration of Discharge Encounter   Greater than 30 minutes including physician time.  Signed, Nicolasa Ducking NP 10/15/2012, 9:02 AM

## 2012-10-15 NOTE — Progress Notes (Signed)
CARDIAC REHAB PHASE I   PRE:  Rate/Rhythm: 62SR  BP:  Supine: 176/90  Sitting:   Standing:    SaO2:   MODE:  Ambulation: outside of door   ft   POST:  Rate/Rhythm: 88SR  BP:  Supine:   Sitting: 167/93  Standing:    SaO2:  0755-0905 Pt only able to walk outside of door with rolling walker and asst x 1. Denied CP. Educated pt on importance of following diabetic and heart healthy diets and to try to quit smoking. Pt seemed overwhelmed and tears appeared in his eyes. Pt uses regular sugar in tea, eats pecan wheel and yogurt for breakfast. Pt did not understand that diabetes was risk factor for heart disease. Discussed with pt trying to make a few changes at a time so that he does not become overwhelmed. Has not diabetes since 2009 but no formal classes. Gave smoking cessation handouts and encouraged pt to call [phone]. Emotional support given. Did not discuss CRP 2 or exercise due to mobility issues.   Luetta Nutting, RN BSN  10/15/2012 9:03 AM

## 2012-10-16 ENCOUNTER — Telehealth: Payer: Self-pay | Admitting: *Deleted

## 2012-10-16 NOTE — Telephone Encounter (Signed)
Unable to leave message no voicemail. Mylo Red RN

## 2012-10-17 NOTE — Telephone Encounter (Signed)
Spoke to the pt to discuss TCM questions:  1)Pt advised he is aware of apt date but could not remember the apt time, advised pt that the apt is set for Tuesday April 1st at 2pm with NP KL 2)Pt advised he will bring in all his medications with him to this upcoming visit  3)Pt advised understanding of medication regimen based on discharge medications in pt d/c summary, pt noted comprehension of discussion 4)Pt had no concerns or questions at this time  Pt advised he will call office if any further assistance needed prior to apt

## 2012-10-22 ENCOUNTER — Encounter: Payer: Self-pay | Admitting: Adult Health

## 2012-10-22 ENCOUNTER — Ambulatory Visit (INDEPENDENT_AMBULATORY_CARE_PROVIDER_SITE_OTHER): Payer: Self-pay | Admitting: Adult Health

## 2012-10-22 VITALS — BP 134/86 | HR 86 | Ht 75.0 in | Wt 250.0 lb

## 2012-10-22 DIAGNOSIS — Z720 Tobacco use: Secondary | ICD-10-CM

## 2012-10-22 DIAGNOSIS — F17200 Nicotine dependence, unspecified, uncomplicated: Secondary | ICD-10-CM

## 2012-10-22 DIAGNOSIS — I10 Essential (primary) hypertension: Secondary | ICD-10-CM

## 2012-10-22 DIAGNOSIS — I2510 Atherosclerotic heart disease of native coronary artery without angina pectoris: Secondary | ICD-10-CM

## 2012-10-22 DIAGNOSIS — M549 Dorsalgia, unspecified: Secondary | ICD-10-CM

## 2012-10-22 NOTE — Progress Notes (Signed)
HPI: 52 year old male with prior history of CAD status post prior stenting of the right coronary artery and LAD in 2000 at [facility]. He was admitted to [facility] hospital 10/15/2012 in the setting of recurrent chest pain with radiation to the left arm. Had subsequent cardiac catheterization demonstrating severe LAD and RCA stenosis at the site of previously placed stents. PCI using drug-eluting stent at the site of previous stent placement was completed by Dr. Tonny Bollman. He was placed on dual antiplatelet therapy with treat recommendations for one year, however back surgery is planned and notes state it would be reasonable to take him off DAPT in 6 months if absolutely necessary to proceed with surgery. He is here 1 week post hospitalization.    No more chest pain. Feeling more energy.  No complaints of bleeding or melena. Continues to have leg pain related to lumbar disc disease. Pain management is now on board from [facility], until surgery is planned. Due to follow up them October 24, 2012. Still uses cane to ambulate.  No Known Allergies  Current Outpatient Prescriptions  Medication Sig Dispense Refill  . acetaminophen (TYLENOL) 500 MG tablet Take 500-1,000 mg by mouth daily as needed for pain.      Marland Kitchen aspirin EC 81 MG tablet Take 81 mg by mouth every morning.       . clonazePAM (KLONOPIN) 0.5 MG tablet Take 0.5 mg by mouth daily as needed for anxiety. Anxiety      . clopidogrel (PLAVIX) 75 MG tablet Take 1 tablet (75 mg total) by mouth daily with breakfast.  30 tablet  6  . cyclobenzaprine (FLEXERIL) 10 MG tablet Take 10 mg by mouth 3 (three) times daily as needed. Muscle Spasms      . fish oil-omega-3 fatty acids 1000 MG capsule Take 2 g by mouth daily.      . metFORMIN (GLUCOPHAGE) 1000 MG tablet Take 1 tablet (1,000 mg total) by mouth 2 (two) times daily with a meal.  60 tablet  1  . metoprolol (LOPRESSOR) 25 MG tablet Take 1 tablet (25 mg total) by mouth 2  (two) times daily.  60 tablet  6  . nitroGLYCERIN (NITROSTAT) 0.4 MG SL tablet Place 1 tablet (0.4 mg total) under the tongue every 5 (five) minutes as needed for chest pain.  30 tablet  12  . oxyCODONE-acetaminophen (PERCOCET/ROXICET) 5-325 MG per tablet Take 1 tablet by mouth every 6 (six) hours as needed. Pain  30 tablet  0  . pravastatin (PRAVACHOL) 40 MG tablet Take 40 mg by mouth every evening.      . quinapril-hydrochlorothiazide (ACCURETIC) 20-25 MG per tablet Take 1 tablet by mouth every morning.      . ranitidine (ZANTAC) 150 MG tablet Take 150 mg by mouth 2 (two) times daily.       No current facility-administered medications for this visit.    Past Medical History  Diagnosis Date  . Anxiety   . Hypertension   . Diabetes mellitus   . Chronic back pain   . Chronic neck pain   . Coronary artery disease     a. 2000: s/p PCI/stenting of prox LAD and RCA @ [facility];  b. 09/2012 Cath/PCI: LM 30d, LAD 40p ISR, 46m, 69m/d (2.25x20 Promus Premier DES), LCX 30p, RCA 80p (3.0x24 Promus Premier DES), patent mid stent, 44m, PDA 21m (2.75x28 Promus Premier DES), EF 60-65%.  . Hyperlipidemia   . Tobacco abuse     Past  Surgical History  Procedure Laterality Date  . Back surgery    . Appendectomy    . Tonsillectomy    . Cardiac stents      ZOX:WRUEAV of systems complete and found to be negative unless listed above  PHYSICAL EXAM BP 134/86  Pulse 86  Ht 6\' 3"  (1.905 m)  Wt 250 lb (113.399 kg)  BMI 31.25 kg/m2 . General: Well developed, well nourished, in no acute distress Head: Eyes PERRLA, No xanthomas.   Normal cephalic and atramatic  Lungs: Clear bilaterally to auscultation and percussion. Heart: HRRR S1 S2, without MRG.  Pulses are 2+ & equal.            No carotid bruit. No JVD.  No abdominal bruits. No femoral bruits. Abdomen: Bowel sounds are positive, abdomen soft and non-tender without masses or                  Hernia's noted. Msk:  Back normal, normal gait. Normal  strength and tone for age. Extremities: No clubbing, cyanosis or edema.  DP +1 Right wrist well healed. No evidence of bleeding or hematoma. Neuro: Alert and oriented X 3. Psych:  Good affect, responds appropriately   EKG: NSR rate of 86 bpm.  ASSESSMENT AND PLAN

## 2012-10-22 NOTE — Assessment & Plan Note (Signed)
Excellent control of blood pressure currently. He will continue on metoprolol 25 mg twice a day and quinapril 20/25 mg daily.

## 2012-10-22 NOTE — Assessment & Plan Note (Signed)
The patient is tolerating dual antiplatelet therapy very well without issues. He has had no recurrence of chest pain, or shortness of breath. He is beginning to feel more energy. His main complaint now or lower back pain and with radiculopathy into the legs. He has had plan to surgery at [facility] but will need to be on dual antiplatelet therapy for a minimum of 6 months. Dr. Tonny Bollman did speak with his surgeon at [facility] with the plan outlined for followup with them and plan surgery in August. We will see the patient again in 3 months. I asked him to keep refills of his nitroglycerin on hand. He knows to call sooner should he become symptomatic. We will continue this risk and estimate for now.

## 2012-10-22 NOTE — Assessment & Plan Note (Signed)
This has been discussed, he is cutting down on his cigarette use. He has a history of anxiety and uses this to help calm himself down. I explained the risks associated with continued smoking and recurrence of her artery stenosis. He verbalizes understanding.

## 2012-10-22 NOTE — Progress Notes (Deleted)
Name: Clayton Foster    DOB: 07-23-1961  Age: 52 y.o.  MR#: [id number]       PCP:  Tylene Fantasia., PA-C      Insurance: Payor: MEDICAID POTENTIAL  Plan: MEDICAID POTENTIAL  Product Type: *No Product type*    CC:    Chief Complaint  Patient presents with  . Coronary Artery Disease    S/P PCI March 2014    VS Filed Vitals:   10/22/12 1350  BP: 134/86  Pulse: 86  Height: 6\' 3"  (1.905 m)  Weight: 250 lb (113.399 kg)    Weights Current Weight  10/22/12 250 lb (113.399 kg)  10/15/12 247 lb 12.8 oz (112.4 kg)  10/15/12 247 lb 12.8 oz (112.4 kg)    Blood Pressure  BP Readings from Last 3 Encounters:  10/22/12 134/86  10/15/12 167/93  10/15/12 167/93     Admit date:  (Not on file) Last encounter with RMR:  10/15/2012   Allergy Review of patient's allergies indicates no known allergies.  Current Outpatient Prescriptions  Medication Sig Dispense Refill  . acetaminophen (TYLENOL) 500 MG tablet Take 500-1,000 mg by mouth daily as needed for pain.      Marland Kitchen aspirin EC 81 MG tablet Take 81 mg by mouth every morning.       . clonazePAM (KLONOPIN) 0.5 MG tablet Take 0.5 mg by mouth daily as needed for anxiety. Anxiety      . clopidogrel (PLAVIX) 75 MG tablet Take 1 tablet (75 mg total) by mouth daily with breakfast.  30 tablet  6  . cyclobenzaprine (FLEXERIL) 10 MG tablet Take 10 mg by mouth 3 (three) times daily as needed. Muscle Spasms      . fish oil-omega-3 fatty acids 1000 MG capsule Take 2 g by mouth daily.      . metFORMIN (GLUCOPHAGE) 1000 MG tablet Take 1 tablet (1,000 mg total) by mouth 2 (two) times daily with a meal.  60 tablet  1  . metoprolol (LOPRESSOR) 25 MG tablet Take 1 tablet (25 mg total) by mouth 2 (two) times daily.  60 tablet  6  . nitroGLYCERIN (NITROSTAT) 0.4 MG SL tablet Place 1 tablet (0.4 mg total) under the tongue every 5 (five) minutes as needed for chest pain.  30 tablet  12  . oxyCODONE-acetaminophen (PERCOCET/ROXICET) 5-325 MG per tablet Take 1 tablet  by mouth every 6 (six) hours as needed. Pain  30 tablet  0  . pravastatin (PRAVACHOL) 40 MG tablet Take 40 mg by mouth every evening.      . quinapril-hydrochlorothiazide (ACCURETIC) 20-25 MG per tablet Take 1 tablet by mouth every morning.      . ranitidine (ZANTAC) 150 MG tablet Take 150 mg by mouth 2 (two) times daily.       No current facility-administered medications for this visit.    Discontinued Meds:    Medications Discontinued During This Encounter  Medication Reason  . nicotine (NICODERM CQ - DOSED IN MG/24 HOURS) 21 mg/24hr patch Error    Patient Active Problem List  Diagnosis  . Diabetes mellitus, type 2  . Hypertension  . High cholesterol  . Back pain with radiculopathy  . Chest pain  . CAD (coronary artery disease)  . Panic disorder  . Tobacco use  . GERD (gastroesophageal reflux disease)  . Hyponatremia  . Intermediate coronary syndrome    LABS    Component Value Date/Time   NA 143 10/15/2012 0500   NA 142 10/14/2012 4098  NA 135 10/11/2012 0528   K 3.9 10/15/2012 0500   K 4.1 10/14/2012 0648   K 3.9 10/11/2012 0528   CL 108 10/15/2012 0500   CL 107 10/14/2012 0648   CL 97 10/11/2012 0528   CO2 24 10/15/2012 0500   CO2 24 10/14/2012 0648   CO2 27 10/11/2012 0528   GLUCOSE 127* 10/15/2012 0500   GLUCOSE 123* 10/14/2012 0648   GLUCOSE 122* 10/11/2012 0528   BUN 9 10/15/2012 0500   BUN 9 10/14/2012 0648   BUN 10 10/11/2012 0528   CREATININE 0.76 10/15/2012 0500   CREATININE 0.83 10/14/2012 0648   CREATININE 0.92 10/11/2012 0528   CALCIUM 9.1 10/15/2012 0500   CALCIUM 9.1 10/14/2012 0648   CALCIUM 9.3 10/11/2012 0528   GFRNONAA >90 10/15/2012 0500   GFRNONAA >90 10/14/2012 0648   GFRNONAA >90 10/11/2012 0528   GFRAA >90 10/15/2012 0500   GFRAA >90 10/14/2012 0648   GFRAA >90 10/11/2012 0528   CMP     Component Value Date/Time   NA 143 10/15/2012 0500   K 3.9 10/15/2012 0500   CL 108 10/15/2012 0500   CO2 24 10/15/2012 0500   GLUCOSE 127* 10/15/2012 0500   BUN 9  10/15/2012 0500   CREATININE 0.76 10/15/2012 0500   CALCIUM 9.1 10/15/2012 0500   PROT 6.8 10/11/2012 0528   ALBUMIN 3.7 10/11/2012 0528   AST 26 10/11/2012 0528   ALT 37 10/11/2012 0528   ALKPHOS 82 10/11/2012 0528   BILITOT 0.3 10/11/2012 0528   GFRNONAA >90 10/15/2012 0500   GFRAA >90 10/15/2012 0500       Component Value Date/Time   WBC 8.8 10/15/2012 0500   WBC 8.6 10/14/2012 0648   WBC 9.6 10/13/2012 0625   HGB 12.1* 10/15/2012 0500   HGB 11.6* 10/14/2012 0648   HGB 12.0* 10/13/2012 0625   HCT 32.9* 10/15/2012 0500   HCT 31.7* 10/14/2012 0648   HCT 33.3* 10/13/2012 0625   MCV 89.6 10/15/2012 0500   MCV 91.9 10/14/2012 0648   MCV 91.2 10/13/2012 0625    Lipid Panel     Component Value Date/Time   CHOL 187 10/10/2012 1036   TRIG 173* 10/10/2012 1036   HDL 27* 10/10/2012 1036   CHOLHDL 6.9 10/10/2012 1036   VLDL 35 10/10/2012 1036   LDLCALC 125* 10/10/2012 1036    ABG No results found for this basename: phart, pco2, pco2art, po2, po2art, hco3, tco2, acidbasedef, o2sat     No results found for this basename: TSH   BNP (last 3 results) No results found for this basename: PROBNP,  in the last 8760 hours Cardiac Panel (last 3 results) No results found for this basename: CKTOTAL, CKMB, TROPONINI, RELINDX,  in the last 72 hours  Iron/TIBC/Ferritin No results found for this basename: iron, tibc, ferritin     EKG Orders placed in visit on 10/22/12  . EKG 12-LEAD     Prior Assessment and Plan Problem List as of 10/22/2012     ICD-9-CM   Diabetes mellitus, type 2   Hypertension   High cholesterol   Back pain with radiculopathy   Chest pain   CAD (coronary artery disease)   Panic disorder   Tobacco use   GERD (gastroesophageal reflux disease)   Hyponatremia   Intermediate coronary syndrome       Imaging: Dg Chest 2 View  10/10/2012  *RADIOLOGY REPORT*  Clinical Data: Chest pain.  CHEST - 2 VIEW  Comparison: 07/22/2012.  Findings: The cardiac  silhouette, mediastinal and hilar  contours are within normal limits and stable.  The lungs are clear.  No pleural effusion.  The bony thorax is intact.  IMPRESSION: No acute cardiopulmonary findings.   Original Report Authenticated By: Rudie Meyer, M.D.

## 2012-10-22 NOTE — Assessment & Plan Note (Signed)
As stated, the patient will remain on dual antiplatelet therapy and he will followup for 6 months. He is due to see his physician at [facility] on April 3. This plan has been discussed with the surgeon by Dr. Tonny Bollman during patient's recent hospitalization.

## 2012-10-22 NOTE — Patient Instructions (Addendum)
Your physician recommends that you schedule a follow-up appointment in: 3 MONTHS WITH KL   Your physician recommends that you continue on your current medications as directed. Please refer to the Current Medication list given to you today.

## 2012-10-28 ENCOUNTER — Telehealth: Payer: Self-pay | Admitting: Adult Health

## 2012-10-28 NOTE — Telephone Encounter (Signed)
Patient describes what he feels is a "panic attack".  States that he is very nervous.  Was checked out by EMS and cleared to stay home, as all vitals and EKG were normal, per patient.  Advised to contact PCP for further evaluation.  States that he is out of pain medicine and needs refills, however cannot get in to see his spine specialist.  States that he would like to be seen in the ER and will probably go that route.

## 2012-10-28 NOTE — Telephone Encounter (Signed)
Patient states that he had some "problems" last night and would like to speak to nurse. / tgs

## 2012-11-01 ENCOUNTER — Telehealth: Payer: Self-pay | Admitting: Cardiovascular Disease

## 2012-11-01 ENCOUNTER — Encounter (HOSPITAL_COMMUNITY): Payer: Self-pay | Admitting: *Deleted

## 2012-11-01 ENCOUNTER — Emergency Department (HOSPITAL_COMMUNITY)
Admission: EM | Admit: 2012-11-01 | Discharge: 2012-11-01 | Disposition: A | Discharge status: Home / Self Care | Payer: Medicaid Other | Attending: Emergency Medicine | Admitting: Emergency Medicine

## 2012-11-01 DIAGNOSIS — Z79899 Other long term (current) drug therapy: Secondary | ICD-10-CM | POA: Insufficient documentation

## 2012-11-01 DIAGNOSIS — M545 Low back pain, unspecified: Secondary | ICD-10-CM | POA: Insufficient documentation

## 2012-11-01 DIAGNOSIS — G8929 Other chronic pain: Secondary | ICD-10-CM | POA: Insufficient documentation

## 2012-11-01 DIAGNOSIS — E785 Hyperlipidemia, unspecified: Secondary | ICD-10-CM | POA: Insufficient documentation

## 2012-11-01 DIAGNOSIS — IMO0002 Reserved for concepts with insufficient information to code with codable children: Secondary | ICD-10-CM | POA: Insufficient documentation

## 2012-11-01 DIAGNOSIS — I10 Essential (primary) hypertension: Secondary | ICD-10-CM | POA: Insufficient documentation

## 2012-11-01 DIAGNOSIS — E119 Type 2 diabetes mellitus without complications: Secondary | ICD-10-CM | POA: Insufficient documentation

## 2012-11-01 DIAGNOSIS — F17200 Nicotine dependence, unspecified, uncomplicated: Secondary | ICD-10-CM | POA: Insufficient documentation

## 2012-11-01 DIAGNOSIS — I2510 Atherosclerotic heart disease of native coronary artery without angina pectoris: Secondary | ICD-10-CM | POA: Insufficient documentation

## 2012-11-01 DIAGNOSIS — Z7902 Long term (current) use of antithrombotics/antiplatelets: Secondary | ICD-10-CM | POA: Insufficient documentation

## 2012-11-01 DIAGNOSIS — Z8659 Personal history of other mental and behavioral disorders: Secondary | ICD-10-CM | POA: Insufficient documentation

## 2012-11-01 DIAGNOSIS — M5416 Radiculopathy, lumbar region: Secondary | ICD-10-CM

## 2012-11-01 DIAGNOSIS — Z7982 Long term (current) use of aspirin: Secondary | ICD-10-CM | POA: Insufficient documentation

## 2012-11-01 MED ORDER — OXYCODONE-ACETAMINOPHEN 5-325 MG PO TABS: 1.0000 | ORAL_TABLET | ORAL | Status: DC | PRN

## 2012-11-01 MED ORDER — OXYCODONE-ACETAMINOPHEN 5-325 MG PO TABS
2.0000 | ORAL_TABLET | Freq: Once | ORAL | Status: AC
  Administered 2012-11-01: 2 via ORAL
  Filled 2012-11-01: qty 2

## 2012-11-01 MED ORDER — DIAZEPAM 5 MG PO TABS
5.0000 mg | ORAL_TABLET | Freq: Once | ORAL | Status: AC
  Administered 2012-11-01: 5 mg via ORAL
  Filled 2012-11-01: qty 1

## 2012-11-01 MED ORDER — KETOROLAC TROMETHAMINE 60 MG/2ML IM SOLN
60.0000 mg | Freq: Once | INTRAMUSCULAR | Status: AC
  Administered 2012-11-01: 60 mg via INTRAMUSCULAR
  Filled 2012-11-01: qty 2

## 2012-11-01 NOTE — Telephone Encounter (Signed)
New problem   Pt is having chronic pain and can't make it to [facility] is it going to hurt him to be in pain thru the weekend. He is in pain because to his back and legs. Please advise pt.

## 2012-11-01 NOTE — ED Notes (Signed)
Reports chronic left lower extremity pain due to degenerative discs in lower back; reports awoke this morning with severe bilateral lower extremity pain; reports takes percocet 5 for his pain, and took his last pill last night.

## 2012-11-01 NOTE — Telephone Encounter (Signed)
Called patient back, but there was a message that his voice mail boxes not set up. I am not going to write any narcotic pain medicines for him. He is followed by a pain management specialist. I discussed this with him during his hospitalization.  Tonny Bollman ,11/01/2012 5:46 PM

## 2012-11-02 ENCOUNTER — Telehealth: Payer: Self-pay | Admitting: Cardiology

## 2012-11-02 NOTE — Telephone Encounter (Signed)
Mr. [name] called requesting a new pain medication because his current pain medication "'is not working." He spoke to our office yesterday regarding this concern. He was also evaluated at [facility] ED for lumbar pain last night. He is not having any CP or SOB. Please see Dr. Earmon Phoenix response dated 11/01/2012. Dr. Excell Seltzer will not prescribe narcotic pain medications for Clayton Foster. He is followed by a pain management specialist at [facility]. He was again instructed to follow-up with his pain management specialist at [facility]. Mr. [name] expressed verbal understanding and agrees to contact his pain management specialist.

## 2012-11-02 NOTE — ED Provider Notes (Signed)
History     CSN: [id number]  Arrival date & time 11/01/12  4098   First MD Initiated Contact with Patient 11/01/12 1004      Chief Complaint  Patient presents with  . Leg Pain    (Consider location/radiation/quality/duration/timing/severity/associated sxs/prior treatment) HPI Comments: Patient with hx of chroni low back pain c/o worsening pain since several hours PTA.  States that he has sharp pains radiating down his legs to his feet with pain of the left worse than right.  He states that he was scheduled to see a "back surgeon" and had to cancel the appt due to chest pain that resulted in placement of cardiac stents.  States that he has not been able to see his PMD recently and ran out of his pain medication on the evening prior to ED arrival. He denies dysuria, abd pain, numbness or weakness or LE's, or incontinence of bowel or bladder.  Patient is a 52 y.o. male presenting with back pain. The history is provided by the patient.  Back Pain Location:  Lumbar spine Quality:  Aching, burning and shooting Radiates to:  L posterior upper leg, R posterior upper leg, L thigh and R thigh Pain severity:  Moderate Pain is:  Same all the time Onset quality:  Unable to specify Timing:  Constant Progression:  Worsening Chronicity:  Chronic Context: not falling, not lifting heavy objects, not recent injury and not twisting   Relieved by:  Nothing Worsened by:  Bending, ambulation, movement and standing Ineffective treatments:  Narcotics Associated symptoms: leg pain   Associated symptoms: no abdominal pain, no bladder incontinence, no bowel incontinence, no chest pain, no dysuria, no fever, no headaches, no numbness, no paresthesias, no pelvic pain, no perianal numbness, no tingling and no weakness     Past Medical History  Diagnosis Date  . Anxiety   . Hypertension   . Diabetes mellitus   . Chronic back pain   . Chronic neck pain   . Coronary artery disease     a. 2000: s/p  PCI/stenting of prox LAD and RCA @ [facility];  b. 09/2012 Cath/PCI: LM 30d, LAD 40p ISR, 80m, 79m/d (2.25x20 Promus Premier DES), LCX 30p, RCA 80p (3.0x24 Promus Premier DES), patent mid stent, 24m, PDA 30m (2.75x28 Promus Premier DES), EF 60-65%.  . Hyperlipidemia   . Tobacco abuse     Past Surgical History  Procedure Laterality Date  . Back surgery    . Appendectomy    . Tonsillectomy    . Cardiac stents      Family History  Problem Relation Age of Onset  . Hypertension Father     History  Substance Use Topics  . Smoking status: Current Every Day Smoker -- 1.00 packs/day    Types: Cigarettes  . Smokeless tobacco: Not on file  . Alcohol Use: No      Review of Systems  Constitutional: Negative for fever.  Respiratory: Negative for shortness of breath.   Cardiovascular: Negative for chest pain.  Gastrointestinal: Negative for vomiting, abdominal pain, constipation and bowel incontinence.  Genitourinary: Negative for bladder incontinence, dysuria, hematuria, flank pain, decreased urine volume, difficulty urinating and pelvic pain.       No perineal numbness or incontinence of urine or feces  Musculoskeletal: Positive for back pain. Negative for joint swelling.  Skin: Negative for rash.  Neurological: Negative for tingling, weakness, numbness, headaches and paresthesias.  All other systems reviewed and are negative.    Allergies  Review of patient's allergies  indicates no known allergies.  Home Medications   Current Outpatient Rx  Name  Route  Sig  Dispense  Refill  . acetaminophen (TYLENOL) 500 MG tablet   Oral   Take 500-1,000 mg by mouth daily as needed for pain.         Marland Kitchen aspirin EC 81 MG tablet   Oral   Take 81 mg by mouth every morning.          . clopidogrel (PLAVIX) 75 MG tablet   Oral   Take 1 tablet (75 mg total) by mouth daily with breakfast.   30 tablet   6   . cyclobenzaprine (FLEXERIL) 10 MG tablet   Oral   Take 10 mg by mouth 3 (three) times  daily as needed. Muscle Spasms         . fish oil-omega-3 fatty acids 1000 MG capsule   Oral   Take 2 g by mouth daily.         . metFORMIN (GLUCOPHAGE) 1000 MG tablet   Oral   Take 1 tablet (1,000 mg total) by mouth 2 (two) times daily with a meal.   60 tablet   1     **Resume on 10/17/2012**   . metoprolol (LOPRESSOR) 25 MG tablet   Oral   Take 1 tablet (25 mg total) by mouth 2 (two) times daily.   60 tablet   6   . oxyCODONE-acetaminophen (PERCOCET/ROXICET) 5-325 MG per tablet   Oral   Take 1 tablet by mouth every 6 (six) hours as needed. Pain   30 tablet   0   . pravastatin (PRAVACHOL) 40 MG tablet   Oral   Take 40 mg by mouth every evening.         . quinapril-hydrochlorothiazide (ACCURETIC) 20-25 MG per tablet   Oral   Take 1 tablet by mouth every morning.         . ranitidine (ZANTAC) 150 MG tablet   Oral   Take 150 mg by mouth 2 (two) times daily.         . nitroGLYCERIN (NITROSTAT) 0.4 MG SL tablet   Sublingual   Place 1 tablet (0.4 mg total) under the tongue every 5 (five) minutes as needed for chest pain.   30 tablet   12   . oxyCODONE-acetaminophen (PERCOCET/ROXICET) 5-325 MG per tablet   Oral   Take 1 tablet by mouth every 4 (four) hours as needed for pain.   8 tablet   0     BP 159/83  Pulse 84  Temp(Src) 97.8 F (36.6 C) (Oral)  Resp 20  Wt 250 lb (113.399 kg)  BMI 31.25 kg/m2  SpO2 100%  Physical Exam  Nursing note and vitals reviewed. Constitutional: He is oriented to person, place, and time. He appears well-developed and well-nourished. No distress.  HENT:  Head: Normocephalic and atraumatic.  Neck: Normal range of motion. Neck supple.  Cardiovascular: Normal rate, regular rhythm, normal heart sounds and intact distal pulses.   No murmur heard. Pulmonary/Chest: Effort normal and breath sounds normal.  Musculoskeletal: He exhibits tenderness. He exhibits no edema.       Lumbar back: He exhibits tenderness and pain. He  exhibits normal range of motion, no swelling, no deformity, no laceration and normal pulse.       Back:  Diffuse ttp of the lumbar paraspinal muscles and bilateral SI joints.  DP pulse brisk and symmetrical, distal sensation intact.  Neurological: He is alert and oriented to  person, place, and time. No cranial nerve deficit or sensory deficit. He exhibits normal muscle tone. Coordination and gait normal.  Reflex Scores:      Patellar reflexes are 2+ on the right side and 2+ on the left side.      Achilles reflexes are 2+ on the right side and 2+ on the left side. Skin: Skin is warm and dry.    ED Course  Procedures (including critical care time)  Labs Reviewed - No data to display No results found.   1. Lumbar radicular pain       MDM    Previous ED visits reviewed, nursing notes considered.  Pt has hx of chronic low pain back and recently ran out of his pain medication.  Patient has ttp of the lumbar paraspinal muscles.  No focal neuro deficits on exam.  Ambulates with a steady gait.     Doubt emergent neurological or infectious process.   Pt advised that he will need further pain management with his PMD of a pain management clinic.  Requests referral for pain management in [place] stating that he has "the [employer] plan"  The patient appears reasonably screened and/or stabilized for discharge and I doubt any other medical condition or other [facility] requiring further screening, evaluation, or treatment in the ED at this time prior to discharge.     Raoul Ciano L. Trisha Mangle, PA-C 11/02/12 1627

## 2012-11-03 NOTE — ED Provider Notes (Signed)
Medical screening examination/treatment/procedure(s) were performed by non-physician practitioner and as supervising physician I was immediately available for consultation/collaboration.   Joya Gaskins, MD 11/03/12 [phone]

## 2012-12-17 ENCOUNTER — Telehealth: Payer: Self-pay | Admitting: *Deleted

## 2012-12-17 NOTE — Telephone Encounter (Signed)
Pt notes this started 3-4 days ago, pt notes he also has a history of skin pigmentation issues as well, pt has been taking the plavix in march with no other sxs notes, bumps are noted bi-laterally only, underside of arms only from armpit to elbow, pt denies changing laundry detergent nor Deoderant, pt notes denies that he has a derm per pigmentation issues have not been treated in many years, pt notes the red dots are ink pen mark in size, advised the issue is highly unlikely to be per plavix due to the fact he has taken it this long without issues, advised pt to call PCP or establish with derm again to advise, please advise if any further instructions should be given based on plavix side effect

## 2012-12-17 NOTE — Telephone Encounter (Signed)
PT STATES THAT HE HAS RED DOTS ON BOTH HIS ARMS AND WANTS TO KNOW IF IT COULD BE CAUSED BY HIS PLAVIX, STATES HE WAS READING ON LINE ABOUT IT.

## 2012-12-18 NOTE — Telephone Encounter (Signed)
Agree. Talk with PCP about this. Plavix unlikely cause after he has been taking for long term.

## 2012-12-18 NOTE — Telephone Encounter (Signed)
No answer, will call back 

## 2012-12-20 NOTE — Telephone Encounter (Signed)
No answer, will call back 

## 2012-12-23 NOTE — Telephone Encounter (Signed)
Spoke to pt to advise results/instructions. Pt understood.  

## 2013-02-03 ENCOUNTER — Encounter: Payer: Medicaid Other | Admitting: Adult Health

## 2013-02-03 NOTE — Progress Notes (Signed)
Cancelled.  

## 2013-02-20 ENCOUNTER — Telehealth: Payer: Self-pay | Admitting: *Deleted

## 2013-02-20 NOTE — Telephone Encounter (Signed)
Called pt to confirm current signs and symptoms, pt notes recent elevated BP in the past few mornings prior to taking the BP medication, pt notes BP lowers after taking the BP everytime he takes it, now BP is138/84 HR 84 and pt denies chest pain but described it as more like indegestion/burning feeling in the left side of his chest on and off for 4 days, pt notes he started protonix 40mg  qd instead of zantac about 2 months ago with no relief, , pt denies SOB/Swelling/Dizzyness pt did advised his stomach hurts/irratated because of the plavix he is on per pt stopped taking his plavix last week for 2 days to see if his stomach stopped hurting and pt noted this did help his stomach pain however pt re-started the plavix per concern with heart attack/stroke risk, and tried to take with more food to assist with stomach pain but still notes not much help, this nurse advised risk of stopping the plavix, pt understood, please advise

## 2013-02-20 NOTE — Telephone Encounter (Signed)
I have reviewed his medications and symptoms. Does not need medication changes at this time. He is on medications for pain relief that can be irritating to the stomach. Am hypertension may be related to back pain (if he has not had surgery) and stiffness associated with it in the early morning hours. He is not to stop taking Plavix for any reason until he is ready for surgery and then is to contact us prior to the procedure for assistance with time table in stopping it.  Consider seeing a GI physician for evaluation of esophagitis or stomach irritation. Pepto bismol can be helpful in coating the stomach as well. He is to watch for overt bleeding or coughing up blood. Pepto bismol can cause stool to be black in color, and this is not necessarily indicative of blood. Tums are ok too.

## 2013-02-20 NOTE — Telephone Encounter (Signed)
.  left message to have patient return my call.  

## 2013-02-20 NOTE — Telephone Encounter (Signed)
Pt states he called yesterday about his medication.  He is calling today because his Bp is 174/135 HR 105, complain of left side of chest hurting for a few days.

## 2013-02-21 ENCOUNTER — Ambulatory Visit (INDEPENDENT_AMBULATORY_CARE_PROVIDER_SITE_OTHER): Payer: Medicaid Other | Admitting: Adult Health

## 2013-02-21 ENCOUNTER — Encounter: Payer: Self-pay | Admitting: Adult Health

## 2013-02-21 VITALS — BP 146/93 | HR 85 | Ht 75.0 in | Wt 241.8 lb

## 2013-02-21 DIAGNOSIS — M549 Dorsalgia, unspecified: Secondary | ICD-10-CM

## 2013-02-21 DIAGNOSIS — I10 Essential (primary) hypertension: Secondary | ICD-10-CM

## 2013-02-21 DIAGNOSIS — I2510 Atherosclerotic heart disease of native coronary artery without angina pectoris: Secondary | ICD-10-CM

## 2013-02-21 DIAGNOSIS — F410 Panic disorder [episodic paroxysmal anxiety] without agoraphobia: Secondary | ICD-10-CM

## 2013-02-21 NOTE — Assessment & Plan Note (Signed)
He talks today about having more panic attacks more often the last few days. He will be seeing psychiatrist this month.

## 2013-02-21 NOTE — Progress Notes (Signed)
HPI: [name] is a 52 year old patient of Dr. Excell Seltzer for following for ongoing assessment and management of CAD with prior stenting of the right coronary artery and LAD in and 2000 and [facility], and recent cardiac catheterization in March of 2014 with a PCI using drug-eluting stent at the site of previous stent placement. Remain on DAPT. He was to continue on this therapy for 6 months prior to back surgery. He was last seen in the office on 10/22/2012.   He comes today without cardiac complaint. He has anxiety attacks more frequently and is due to see his psychiatrist this month for medications.        No Known Allergies  Current Outpatient Prescriptions  Medication Sig Dispense Refill  . acetaminophen (TYLENOL) 500 MG tablet Take 500-1,000 mg by mouth daily as needed for pain.      Marland Kitchen aspirin EC 81 MG tablet Take 81 mg by mouth every morning.       Marland Kitchen atorvastatin (LIPITOR) 40 MG tablet Take 40 mg by mouth daily.      . clopidogrel (PLAVIX) 75 MG tablet Take 1 tablet (75 mg total) by mouth daily with breakfast.  30 tablet  6  . cyclobenzaprine (FLEXERIL) 10 MG tablet Take 10 mg by mouth 3 (three) times daily as needed. Muscle Spasms      . fish oil-omega-3 fatty acids 1000 MG capsule Take 2 g by mouth daily.      . metFORMIN (GLUCOPHAGE) 1000 MG tablet Take 1 tablet (1,000 mg total) by mouth 2 (two) times daily with a meal.  60 tablet  1  . metoprolol (LOPRESSOR) 25 MG tablet Take 1 tablet (25 mg total) by mouth 2 (two) times daily.  60 tablet  6  . nitroGLYCERIN (NITROSTAT) 0.4 MG SL tablet Place 1 tablet (0.4 mg total) under the tongue every 5 (five) minutes as needed for chest pain.  30 tablet  12  . oxyCODONE-acetaminophen (PERCOCET/ROXICET) 5-325 MG per tablet Take 1 tablet by mouth every 4 (four) hours as needed for pain.  8 tablet  0  . pantoprazole (PROTONIX) 40 MG tablet Take 40 mg by mouth daily.      . quinapril-hydrochlorothiazide (ACCURETIC) 20-25 MG per tablet Take 1 tablet by mouth  every morning.       No current facility-administered medications for this visit.    Past Medical History  Diagnosis Date  . Anxiety   . Hypertension   . Diabetes mellitus   . Chronic back pain   . Chronic neck pain   . Coronary artery disease     a. 2000: s/p PCI/stenting of prox LAD and RCA @ [facility];  b. 09/2012 Cath/PCI: LM 30d, LAD 40p ISR, 4m, 81m/d (2.25x20 Promus Premier DES), LCX 30p, RCA 80p (3.0x24 Promus Premier DES), patent mid stent, 2m, PDA 64m (2.75x28 Promus Premier DES), EF 60-65%.  . Hyperlipidemia   . Tobacco abuse     Past Surgical History  Procedure Laterality Date  . Back surgery    . Appendectomy    . Tonsillectomy    . Cardiac stents      ROS.  NROS   PHYSICAL EXAM BP 146/93  Pulse 85  Ht 6\' 3"  (1.905 m)  Wt 241 lb 12 oz (109.657 kg)  BMI 30.22 kg/m2 General: Well developed, well nourished, in no acute distress Head: Eyes PERRLA, No xanthomas.   Normal cephalic and atramatic  Lungs: Clear bilaterally to auscultation and percussion. Heart: HRRR S1 S2, without MRG.  Pulses are 2+ & equal.            No carotid bruit. No JVD.  No abdominal bruits. No femoral bruits. Abdomen: Bowel sounds are positive, abdomen soft and non-tender without masses or                  Hernia's noted. Msk:  Back normal, normal gait. Normal strength and tone for age. Extremities: No clubbing, cyanosis or edema.  DP +1 Neuro: lert and oriented X 3. Psych:  Good affect, responds appropriately  EKG:NSR rate of 80 bpm.Left axis deviation.  ASSESSMENT AND PLAN

## 2013-02-21 NOTE — Assessment & Plan Note (Signed)
BP is controlled. He has lost 9 lbs and is medically complaint. No changes at this time in meds. See him again in 3 months.

## 2013-02-21 NOTE — Assessment & Plan Note (Signed)
He is stable from CV standpoint. He has no recurrence of angina or issues with medical non-compliance. He will continue current medication regimen. He is to see [place] in 3 months for pre-operative evaluation and recommendations for Plavix cessation peri-operatively back surgery.

## 2013-02-21 NOTE — Assessment & Plan Note (Signed)
Surgery is planned for Sept or Oct. He will make appt when surgery date is set for pre-operative evaluation.

## 2013-02-21 NOTE — Patient Instructions (Addendum)
Your physician recommends that you schedule a follow-up appointment in: 3 months. Your physician recommends that you continue on your current medications as directed. Please refer to the Current Medication list given to you today. 

## 2013-02-21 NOTE — Telephone Encounter (Signed)
Spoke to pt to advise results/instructions. Pt understood. Pt will be seen at [facility] today with KL NP and advised he has an upcoming apt with his psychiatrist on 03-14-13 and he feels a lot of his problems are anxiety related, pt will discuss any further concerns at [facility] today

## 2013-02-21 NOTE — Progress Notes (Signed)
Name: Clayton Foster    DOB: March 10, 1961  Age: 52 y.o.  MR#: [id number]       PCP:  Tylene Fantasia., PA-C      Insurance: Payor: MEDICAID [place] / Plan: MEDICAID OF [place] / Product Type: *No Product type* /   CC:    Chief Complaint  Patient presents with  . Coronary Artery Disease  . Hypertension   PT NOTES HE CUT HIS SMOKING DOWN TO 5 CIG A DAY FOR THE PAST 2 WEEKS, PT REQUESTED EKG PER RECENT CONCERNS NOTED IN PHONE NOTE FROM YESTERDAY 02-20-13 VS Filed Vitals:   02/21/13 1254  BP: 146/93  Pulse: 85  Height: 6\' 3"  (1.905 m)  Weight: 241 lb 12 oz (109.657 kg)    Weights Current Weight  02/21/13 241 lb 12 oz (109.657 kg)  11/01/12 250 lb (113.399 kg)  10/22/12 250 lb (113.399 kg)    Blood Pressure  BP Readings from Last 3 Encounters:  02/21/13 146/93  11/01/12 159/83  10/22/12 134/86     Admit date:  (Not on file) Last encounter with RMR:  10/28/2012   Allergy Review of patient's allergies indicates no known allergies.  Current Outpatient Prescriptions  Medication Sig Dispense Refill  . acetaminophen (TYLENOL) 500 MG tablet Take 500-1,000 mg by mouth daily as needed for pain.      Marland Kitchen aspirin EC 81 MG tablet Take 81 mg by mouth every morning.       Marland Kitchen atorvastatin (LIPITOR) 40 MG tablet Take 40 mg by mouth daily.      . clopidogrel (PLAVIX) 75 MG tablet Take 1 tablet (75 mg total) by mouth daily with breakfast.  30 tablet  6  . cyclobenzaprine (FLEXERIL) 10 MG tablet Take 10 mg by mouth 3 (three) times daily as needed. Muscle Spasms      . fish oil-omega-3 fatty acids 1000 MG capsule Take 2 g by mouth daily.      . metFORMIN (GLUCOPHAGE) 1000 MG tablet Take 1 tablet (1,000 mg total) by mouth 2 (two) times daily with a meal.  60 tablet  1  . metoprolol (LOPRESSOR) 25 MG tablet Take 1 tablet (25 mg total) by mouth 2 (two) times daily.  60 tablet  6  . nitroGLYCERIN (NITROSTAT) 0.4 MG SL tablet Place 1 tablet (0.4 mg total) under the tongue every 5 (five) minutes as needed for chest  pain.  30 tablet  12  . oxyCODONE-acetaminophen (PERCOCET/ROXICET) 5-325 MG per tablet Take 1 tablet by mouth every 4 (four) hours as needed for pain.  8 tablet  0  . pantoprazole (PROTONIX) 40 MG tablet Take 40 mg by mouth daily.      . quinapril-hydrochlorothiazide (ACCURETIC) 20-25 MG per tablet Take 1 tablet by mouth every morning.       No current facility-administered medications for this visit.    Discontinued Meds:    Medications Discontinued During This Encounter  Medication Reason  . oxyCODONE-acetaminophen (PERCOCET/ROXICET) 5-325 MG per tablet Error  . pravastatin (PRAVACHOL) 40 MG tablet Error  . ranitidine (ZANTAC) 150 MG tablet Error    Patient Active Problem List   Diagnosis Date Noted  . Intermediate coronary syndrome 10/14/2012  . Hyponatremia 10/10/2012  . Chest pain 07/22/2012  . CAD (coronary artery disease) 07/22/2012  . Panic disorder 07/22/2012  . Tobacco use 07/22/2012  . GERD (gastroesophageal reflux disease) 07/22/2012  . Diabetes mellitus, type 2 09/28/2011  . Hypertension 09/28/2011  . High cholesterol 09/28/2011  . Back pain with  radiculopathy 09/28/2011    LABS    Component Value Date/Time   NA 143 10/15/2012 0500   NA 142 10/14/2012 0648   NA 135 10/11/2012 0528   K 3.9 10/15/2012 0500   K 4.1 10/14/2012 0648   K 3.9 10/11/2012 0528   CL 108 10/15/2012 0500   CL 107 10/14/2012 0648   CL 97 10/11/2012 0528   CO2 24 10/15/2012 0500   CO2 24 10/14/2012 0648   CO2 27 10/11/2012 0528   GLUCOSE 127* 10/15/2012 0500   GLUCOSE 123* 10/14/2012 0648   GLUCOSE 122* 10/11/2012 0528   BUN 9 10/15/2012 0500   BUN 9 10/14/2012 0648   BUN 10 10/11/2012 0528   CREATININE 0.76 10/15/2012 0500   CREATININE 0.83 10/14/2012 0648   CREATININE 0.92 10/11/2012 0528   CALCIUM 9.1 10/15/2012 0500   CALCIUM 9.1 10/14/2012 0648   CALCIUM 9.3 10/11/2012 0528   GFRNONAA >90 10/15/2012 0500   GFRNONAA >90 10/14/2012 0648   GFRNONAA >90 10/11/2012 0528   GFRAA >90 10/15/2012 0500    GFRAA >90 10/14/2012 0648   GFRAA >90 10/11/2012 0528   CMP     Component Value Date/Time   NA 143 10/15/2012 0500   K 3.9 10/15/2012 0500   CL 108 10/15/2012 0500   CO2 24 10/15/2012 0500   GLUCOSE 127* 10/15/2012 0500   BUN 9 10/15/2012 0500   CREATININE 0.76 10/15/2012 0500   CALCIUM 9.1 10/15/2012 0500   PROT 6.8 10/11/2012 0528   ALBUMIN 3.7 10/11/2012 0528   AST 26 10/11/2012 0528   ALT 37 10/11/2012 0528   ALKPHOS 82 10/11/2012 0528   BILITOT 0.3 10/11/2012 0528   GFRNONAA >90 10/15/2012 0500   GFRAA >90 10/15/2012 0500       Component Value Date/Time   WBC 8.8 10/15/2012 0500   WBC 8.6 10/14/2012 0648   WBC 9.6 10/13/2012 0625   HGB 12.1* 10/15/2012 0500   HGB 11.6* 10/14/2012 0648   HGB 12.0* 10/13/2012 0625   HCT 32.9* 10/15/2012 0500   HCT 31.7* 10/14/2012 0648   HCT 33.3* 10/13/2012 0625   MCV 89.6 10/15/2012 0500   MCV 91.9 10/14/2012 0648   MCV 91.2 10/13/2012 0625    Lipid Panel     Component Value Date/Time   CHOL 187 10/10/2012 1036   TRIG 173* 10/10/2012 1036   HDL 27* 10/10/2012 1036   CHOLHDL 6.9 10/10/2012 1036   VLDL 35 10/10/2012 1036   LDLCALC 125* 10/10/2012 1036    ABG No results found for this basename: phart, pco2, pco2art, po2, po2art, hco3, tco2, acidbasedef, o2sat     No results found for this basename: TSH   BNP (last 3 results) No results found for this basename: PROBNP,  in the last 8760 hours Cardiac Panel (last 3 results) No results found for this basename: CKTOTAL, CKMB, TROPONINI, RELINDX,  in the last 72 hours  Iron/TIBC/Ferritin No results found for this basename: iron, tibc, ferritin     EKG Orders placed in visit on 02/21/13  . EKG 12-LEAD     Prior Assessment and Plan Problem List as of 02/21/2013   Diabetes mellitus, type 2   Hypertension   Last Assessment & Plan   10/22/2012 Office Visit Written 10/22/2012  2:20 PM by Jodelle Gross, NP     Excellent control of blood pressure currently. He will continue on metoprolol 25 mg twice a day  and quinapril 20/25 mg daily.    High cholesterol  Back pain with radiculopathy   Last Assessment & Plan   10/22/2012 Office Visit Written 10/22/2012  2:21 PM by Jodelle Gross, NP     As stated, the patient will remain on dual antiplatelet therapy and he will followup for 6 months. He is due to see his physician at [facility] on April 3. This plan has been discussed with the surgeon by Dr. Tonny Bollman during patient's recent hospitalization.    Chest pain   CAD (coronary artery disease)   Last Assessment & Plan   10/22/2012 Office Visit Written 10/22/2012  2:19 PM by Jodelle Gross, NP     The patient is tolerating dual antiplatelet therapy very well without issues. He has had no recurrence of chest pain, or shortness of breath. He is beginning to feel more energy. His main complaint now or lower back pain and with radiculopathy into the legs. He has had plan to surgery at [facility] but will need to be on dual antiplatelet therapy for a minimum of 6 months. Dr. Tonny Bollman did speak with his surgeon at [facility] with the plan outlined for followup with them and plan surgery in August. We will see the patient again in 3 months. I asked him to keep refills of his nitroglycerin on hand. He knows to call sooner should he become symptomatic. We will continue this risk and estimate for now.    Panic disorder   Tobacco use   Last Assessment & Plan   10/22/2012 Office Visit Written 10/22/2012  2:20 PM by Jodelle Gross, NP     This has been discussed, he is cutting down on his cigarette use. He has a history of anxiety and uses this to help calm himself down. I explained the risks associated with continued smoking and recurrence of her artery stenosis. He verbalizes understanding.    GERD (gastroesophageal reflux disease)   Hyponatremia   Intermediate coronary syndrome       Imaging: No results found.

## 2013-02-25 ENCOUNTER — Telehealth: Payer: Self-pay | Admitting: Adult Health

## 2013-02-25 NOTE — Telephone Encounter (Signed)
Left message for respiratory dept to contact office with chart information per unable to locate

## 2013-02-25 NOTE — Telephone Encounter (Signed)
Patient states that he wants to know why his "lung function test" was not in his chart. / tgs

## 2013-02-27 NOTE — Telephone Encounter (Signed)
Called respiratory rep Misty Stanley to confirm location of report, noted pt had a portable test performed in march with [facility], noted status as blank in order for procedure per ordered from Kerin Perna, transferred to Murray Hodgkins with [facility] resp team to advise per system entrance noted as different, noted in notes scanned into system, printed for pt and placed at front desk, called pt to advise, left message

## 2013-03-04 NOTE — Telephone Encounter (Signed)
Pt wanted to ask what the report read, read the results to the pt, pt still not understanding what the doctor meant, this nurse advised that this test was performed in march via [facility] and that he can contact their office [phone] to have a clarification informed from the test performed in march, pt understood, number given for pt PCP to have and test placed back up front for pt to pick up asap

## 2013-05-29 ENCOUNTER — Other Ambulatory Visit: Payer: Self-pay

## 2013-06-06 ENCOUNTER — Emergency Department (HOSPITAL_COMMUNITY): Payer: Medicaid Other

## 2013-06-06 ENCOUNTER — Encounter (HOSPITAL_COMMUNITY): Payer: Self-pay | Admitting: Emergency Medicine

## 2013-06-06 ENCOUNTER — Emergency Department (HOSPITAL_COMMUNITY)
Admission: EM | Admit: 2013-06-06 | Discharge: 2013-06-06 | Disposition: A | Discharge status: Home / Self Care | Payer: Medicaid Other | Attending: Emergency Medicine | Admitting: Emergency Medicine

## 2013-06-06 DIAGNOSIS — E785 Hyperlipidemia, unspecified: Secondary | ICD-10-CM | POA: Insufficient documentation

## 2013-06-06 DIAGNOSIS — E119 Type 2 diabetes mellitus without complications: Secondary | ICD-10-CM | POA: Insufficient documentation

## 2013-06-06 DIAGNOSIS — Z7902 Long term (current) use of antithrombotics/antiplatelets: Secondary | ICD-10-CM | POA: Insufficient documentation

## 2013-06-06 DIAGNOSIS — R1013 Epigastric pain: Secondary | ICD-10-CM | POA: Insufficient documentation

## 2013-06-06 DIAGNOSIS — G8929 Other chronic pain: Secondary | ICD-10-CM | POA: Insufficient documentation

## 2013-06-06 DIAGNOSIS — M545 Low back pain, unspecified: Secondary | ICD-10-CM | POA: Insufficient documentation

## 2013-06-06 DIAGNOSIS — M549 Dorsalgia, unspecified: Secondary | ICD-10-CM

## 2013-06-06 DIAGNOSIS — I10 Essential (primary) hypertension: Secondary | ICD-10-CM | POA: Insufficient documentation

## 2013-06-06 DIAGNOSIS — Z79899 Other long term (current) drug therapy: Secondary | ICD-10-CM | POA: Insufficient documentation

## 2013-06-06 DIAGNOSIS — Z9089 Acquired absence of other organs: Secondary | ICD-10-CM | POA: Insufficient documentation

## 2013-06-06 DIAGNOSIS — Z9861 Coronary angioplasty status: Secondary | ICD-10-CM | POA: Insufficient documentation

## 2013-06-06 DIAGNOSIS — Z7982 Long term (current) use of aspirin: Secondary | ICD-10-CM | POA: Insufficient documentation

## 2013-06-06 DIAGNOSIS — I2510 Atherosclerotic heart disease of native coronary artery without angina pectoris: Secondary | ICD-10-CM | POA: Insufficient documentation

## 2013-06-06 DIAGNOSIS — F17200 Nicotine dependence, unspecified, uncomplicated: Secondary | ICD-10-CM | POA: Insufficient documentation

## 2013-06-06 DIAGNOSIS — R1011 Right upper quadrant pain: Secondary | ICD-10-CM | POA: Insufficient documentation

## 2013-06-06 DIAGNOSIS — R109 Unspecified abdominal pain: Secondary | ICD-10-CM

## 2013-06-06 LAB — URINALYSIS, ROUTINE W REFLEX MICROSCOPIC
Bilirubin Urine: NEGATIVE
Ketones, ur: NEGATIVE mg/dL
Nitrite: NEGATIVE
Specific Gravity, Urine: 1.01 (ref 1.005–1.030)
Urobilinogen, UA: 0.2 mg/dL (ref 0.0–1.0)

## 2013-06-06 LAB — CBC WITH DIFFERENTIAL/PLATELET
Basophils Relative: 0 % (ref 0–1)
Eosinophils Absolute: 0 10*3/uL (ref 0.0–0.7)
Hemoglobin: 14.3 g/dL (ref 13.0–17.0)
MCH: 32.6 pg (ref 26.0–34.0)
MCHC: 36.5 g/dL — ABNORMAL HIGH (ref 30.0–36.0)
Monocytes Relative: 9 % (ref 3–12)
Neutrophils Relative %: 66 % (ref 43–77)
Platelets: 247 10*3/uL (ref 150–400)
RDW: 12.4 % (ref 11.5–15.5)

## 2013-06-06 LAB — BASIC METABOLIC PANEL
BUN: 9 mg/dL (ref 6–23)
Creatinine, Ser: 0.65 mg/dL (ref 0.50–1.35)
GFR calc Af Amer: >90 mL/min (ref >90)
GFR calc non Af Amer: >90 mL/min (ref >90)
Potassium: 3.1 mEq/L — ABNORMAL LOW (ref 3.5–5.1)

## 2013-06-06 LAB — HEPATIC FUNCTION PANEL
ALT: 34 U/L (ref 0–53)
AST: 29 U/L (ref 0–37)
Alkaline Phosphatase: 91 U/L (ref 39–117)
Bilirubin, Direct: 0.2 mg/dL (ref 0.0–0.3)
Total Bilirubin: 0.8 mg/dL (ref 0.3–1.2)

## 2013-06-06 MED ORDER — IOHEXOL 300 MG/ML  SOLN
50.0000 mL | Freq: Once | INTRAMUSCULAR | Status: AC | PRN
  Administered 2013-06-06: 50 mL via ORAL

## 2013-06-06 MED ORDER — IOHEXOL 300 MG/ML  SOLN: 50.0000 mL | Freq: Once | INTRAMUSCULAR | Status: DC | PRN

## 2013-06-06 MED ORDER — SODIUM CHLORIDE 0.9 % IV BOLUS (SEPSIS)
1000.0000 mL | Freq: Once | INTRAVENOUS | Status: AC
  Administered 2013-06-06: 1000 mL via INTRAVENOUS

## 2013-06-06 MED ORDER — ONDANSETRON HCL 4 MG/2ML IJ SOLN
INTRAMUSCULAR | Status: AC
  Filled 2013-06-06: qty 2

## 2013-06-06 MED ORDER — IOHEXOL 300 MG/ML  SOLN
100.0000 mL | Freq: Once | INTRAMUSCULAR | Status: AC | PRN
  Administered 2013-06-06: 100 mL via INTRAVENOUS

## 2013-06-06 MED ORDER — GI COCKTAIL ~~LOC~~
30.0000 mL | Freq: Once | ORAL | Status: AC
  Administered 2013-06-06: 30 mL via ORAL
  Filled 2013-06-06: qty 30

## 2013-06-06 MED ORDER — HYDROMORPHONE HCL PF 1 MG/ML IJ SOLN: 1.0000 mg | Freq: Once | INTRAMUSCULAR | Status: AC

## 2013-06-06 MED ORDER — ONDANSETRON HCL 4 MG/2ML IJ SOLN
4.0000 mg | Freq: Once | INTRAMUSCULAR | Status: AC
  Administered 2013-06-06: 4 mg via INTRAVENOUS
  Filled 2013-06-06: qty 2

## 2013-06-06 MED ORDER — HYDROMORPHONE HCL PF 1 MG/ML IJ SOLN
INTRAMUSCULAR | Status: AC
  Administered 2013-06-06: 1 mg via INTRAVENOUS
  Filled 2013-06-06: qty 1

## 2013-06-06 MED ORDER — ONDANSETRON HCL 4 MG PO TABS: 4.0000 mg | ORAL_TABLET | Freq: Four times a day (QID) | ORAL | Status: DC

## 2013-06-06 MED ORDER — MORPHINE SULFATE 4 MG/ML IJ SOLN
4.0000 mg | Freq: Once | INTRAMUSCULAR | Status: AC
  Administered 2013-06-06: 4 mg via INTRAVENOUS
  Filled 2013-06-06: qty 1

## 2013-06-06 NOTE — ED Notes (Addendum)
Patient w/chronic back and leg pain presents w/abdominal pain, sharp, burning and reportedly dark, tarry, malodorous stools.  Has been taking 1 Percocet q 4 hours alternating w/650 Tylenol in between. Also on Plavix. Has not taken NSAIDs in several months.  Has had 3-4 BMs since yesterday.

## 2013-06-06 NOTE — ED Notes (Signed)
Low back and leg pain  , says he had rectal bleeding yesterday. Nausea, no vomiting. Says he has not been eating due to the pain. Abd pain.l

## 2013-06-06 NOTE — ED Provider Notes (Signed)
CSN: [id number]     Arrival date & time 06/06/13  1115 History  This chart was scribed for Clayton Octave, MD by Bennett Scrape, ED Scribe. This patient was seen in room APA06/APA06 and the patient's care was started at 2:07 PM.     Chief Complaint  Patient presents with  . Rectal Bleeding    The history is provided by the patient. No language interpreter was used.    HPI Comments: Clayton Foster is a 52 y.o. male with a h/o chronic back pain who presents to the Emergency Department complaining of gradual onset, gradually worsening, constant abdominal pain with 2 to 3 associated episodes of melena since yesterday. He describes the pain as a diffuse burning sensation that is worse in the lower abdomen that he attributes to overuse of Tylenol to treat his chronic back pain. He denies any modifying factors. He denies seeing any bright red blood or blood with wiping and denies any increase in BMs. He states that he took 2 650 mg Tylenols accompanied with his normal Percocet 5-325 dose 6 times since yesterday which may have triggered this episode. He denies Motrin or Ibuprofen use. He has a h/o appendectomy.He denies any fevers or urinary symptoms.  He has a h/o 3 cardiac stents, HTN, DM and HLD. He denies any alcohol use. He denies any prior colonoscopy.   He has a secondary complaint of worsening of his chronic lower back pain that radiates down the left leg. He reports that the leg pain is the worst currently. He reports that the current pain is similar to prior episodes just more severe. He reports that he took 2 650 mg Tylenols accompanied with his normal Percocet 5-325 dose 6 times since yesterday with no improvement. He admits that he has an appointment with a pain specialist next week for the same and would just like pain control for now. He denies any recent falls. He denies any bowel or bladder incontinence.   [facility]  Past Medical History  Diagnosis Date  .  Anxiety   . Hypertension   . Diabetes mellitus   . Chronic back pain   . Chronic neck pain   . Coronary artery disease     a. 2000: s/p PCI/stenting of prox LAD and RCA @ [facility];  b. 09/2012 Cath/PCI: LM 30d, LAD 40p ISR, 85m, 85m/d (2.25x20 Promus Premier DES), LCX 30p, RCA 80p (3.0x24 Promus Premier DES), patent mid stent, 66m, PDA 47m (2.75x28 Promus Premier DES), EF 60-65%.  . Hyperlipidemia   . Tobacco abuse    Past Surgical History  Procedure Laterality Date  . Back surgery    . Appendectomy    . Tonsillectomy    . Cardiac stents     Family History  Problem Relation Age of Onset  . Hypertension Father    History  Substance Use Topics  . Smoking status: Current Every Day Smoker -- 0.25 packs/day    Types: Cigarettes  . Smokeless tobacco: Not on file     Comment: ONLY SMOKES 5 CIGGS A DAY FOR 2 WEEKS  . Alcohol Use: No    Review of Systems  A complete 10 system review of systems was obtained and all systems are negative except as noted in the HPI and PMH.   Allergies  Review of patient's allergies indicates no known allergies.  Home Medications   Current Outpatient Rx  Name  Route  Sig  Dispense  Refill  . acetaminophen (TYLENOL) 500  MG tablet   Oral   Take 1,000 mg by mouth daily as needed for pain.          Marland Kitchen aspirin EC 81 MG tablet   Oral   Take 81 mg by mouth every morning.          Marland Kitchen atorvastatin (LIPITOR) 40 MG tablet   Oral   Take 40 mg by mouth daily.         . busPIRone (BUSPAR) 15 MG tablet   Oral   Take 15 mg by mouth 2 (two) times daily.         . clonazePAM (KLONOPIN) 0.5 MG tablet   Oral   Take 0.5 mg by mouth 2 (two) times daily.         . clopidogrel (PLAVIX) 75 MG tablet   Oral   Take 1 tablet (75 mg total) by mouth daily with breakfast.   30 tablet   6   . cyclobenzaprine (FLEXERIL) 10 MG tablet   Oral   Take 10 mg by mouth 3 (three) times daily as needed. Muscle Spasms         . fish oil-omega-3 fatty acids 1000 MG  capsule   Oral   Take 2 g by mouth daily.         Marland Kitchen FLUoxetine (PROZAC) 20 MG capsule   Oral   Take 20 mg by mouth daily.         . metFORMIN (GLUCOPHAGE) 1000 MG tablet   Oral   Take 1 tablet (1,000 mg total) by mouth 2 (two) times daily with a meal.   60 tablet   1     **Resume on 10/17/2012**   . metoprolol (LOPRESSOR) 25 MG tablet   Oral   Take 1 tablet (25 mg total) by mouth 2 (two) times daily.   60 tablet   6   . oxyCODONE-acetaminophen (PERCOCET/ROXICET) 5-325 MG per tablet   Oral   Take 1 tablet by mouth every 4 (four) hours as needed for pain.   8 tablet   0   . quinapril-hydrochlorothiazide (ACCURETIC) 20-25 MG per tablet   Oral   Take 1 tablet by mouth every morning.         . nitroGLYCERIN (NITROSTAT) 0.4 MG SL tablet   Sublingual   Place 1 tablet (0.4 mg total) under the tongue every 5 (five) minutes as needed for chest pain.   30 tablet   12   . ondansetron (ZOFRAN) 4 MG tablet   Oral   Take 1 tablet (4 mg total) by mouth every 6 (six) hours.   12 tablet   0   . pantoprazole (PROTONIX) 40 MG tablet   Oral   Take 40 mg by mouth daily.          Triage Vitals: BP 149/85  Pulse 79  Temp(Src) 97.7 F (36.5 C) (Oral)  Resp 20  Ht 6\' 3"  (1.905 m)  Wt 245 lb (111.131 kg)  BMI 30.62 kg/m2  SpO2 99%  Physical Exam  Nursing note and vitals reviewed. Constitutional: He is oriented to person, place, and time. He appears well-developed and well-nourished. No distress.  Uncomfortbale  HENT:  Head: Normocephalic and atraumatic.  Eyes: EOM are normal.  Neck: Neck supple. No tracheal deviation present.  Cardiovascular: Normal rate and regular rhythm.   Pulmonary/Chest: Effort normal and breath sounds normal. No respiratory distress.  Abdominal: Soft. There is tenderness (RUQ and epigastric). There is guarding (voluntary). There is no  rebound.  Genitourinary:  Rectal: Rectal tone is normal. Brown stool with no gross blood. Chaperone present   Musculoskeletal: Normal range of motion.  No lumbar tenderness  Neurological: He is alert and oriented to person, place, and time.  . Ankle plantar and dorsiflexion intact. Great toe extension intact bilaterally. +2 DP and PT pulses. +2 patellar reflexes bilaterally. Slightly weaker on the left likely effort dependant and secondary to pain  Skin: Skin is warm and dry.  Psychiatric: He has a normal mood and affect. His behavior is normal.    ED Course  Procedures (including critical care time)  Medications  sodium chloride 0.9 % bolus 1,000 mL (0 mLs Intravenous Stopped 06/06/13 1811)  ondansetron (ZOFRAN) injection 4 mg (4 mg Intravenous Given 06/06/13 1419)  morphine 4 MG/ML injection 4 mg (4 mg Intravenous Given 06/06/13 1419)  HYDROmorphone (DILAUDID) injection 1 mg (1 mg Intravenous Given 06/06/13 1523)  iohexol (OMNIPAQUE) 300 MG/ML solution 50 mL (50 mLs Oral Contrast Given 06/06/13 1717)  iohexol (OMNIPAQUE) 300 MG/ML solution 100 mL (100 mLs Intravenous Contrast Given 06/06/13 1717)  gi cocktail (Maalox,Lidocaine,Donnatal) (30 mLs Oral Given 06/06/13 1813)    DIAGNOSTIC STUDIES: Oxygen Saturation is 99% on room air, normal by my interpretation.    COORDINATION OF CARE: 2:09 PM-Discussed treatment plan which includes medications, CBC panel and BMP with pt at bedside and pt agreed to plan.   Labs Review Labs Reviewed  CBC WITH DIFFERENTIAL - Abnormal; Notable for the following:    MCHC 36.5 (*)    All other components within normal limits  BASIC METABOLIC PANEL - Abnormal; Notable for the following:    Potassium 3.1 (*)    Glucose, Bld 191 (*)    All other components within normal limits  SALICYLATE LEVEL - Abnormal; Notable for the following:    Salicylate Lvl <2.0 (*)    All other components within normal limits  LIPASE, BLOOD - Abnormal; Notable for the following:    Lipase 67 (*)    All other components within normal limits  URINALYSIS, ROUTINE W REFLEX  MICROSCOPIC  ACETAMINOPHEN LEVEL  ETHANOL  HEPATIC FUNCTION PANEL  LACTIC ACID, PLASMA   Imaging Review Ct Abdomen Pelvis W Contrast  06/06/2013   CLINICAL DATA:  Rectal bleeding and abdominal pain  EXAM: CT ABDOMEN AND PELVIS WITH CONTRAST  TECHNIQUE: Multidetector CT imaging of the abdomen and pelvis was performed using the standard protocol following bolus administration of intravenous contrast.  CONTRAST:  50mL OMNIPAQUE IOHEXOL 300 MG/ML SOLN, OMNIPAQUE IOHEXOL 300 MG/ML SOLN  COMPARISON:  None.  FINDINGS: Lung bases are clear.  No pericardial fluid.  No focal hepatic lesion. Gallbladder, pancreas, spleen, adrenal glands, and kidneys are normal. There is diffuse low-attenuation in the liver. Stomach, small bowel, and cecum are normal. The appendix is not identified. The colon and rectosigmoid colon are normal. The sigmoid colon and rectum are predominantly collapsed.  Abdominal aorta is normal in caliber. No retroperitoneal or periportal lymphadenopathy. No pelvic lymphadenopathy. The prostate gland is small. Bladder normal. No free fluid the pelvis.  Review of bone windows demonstrates no aggressive osseous lesions.  IMPRESSION: 1. No explanation for rectal bleeding or abdominal pain. 2. Low-density liver suggests hepatic steatosis.   Electronically Signed   By: Genevive Bi M.D.   On: 06/06/2013 17:45   US Abdomen Limited Ruq  06/06/2013   CLINICAL DATA:  Right upper quadrant pain.  EXAM: US ABDOMEN LIMITED - RIGHT UPPER QUADRANT  COMPARISON:  None.  FINDINGS: Gallbladder  Gallbladder is partially contracted. No definite gallstones are noted. There is mild thickening of the gallbladder wall at 3.1 mm. Negative Murphy sign.  Common bile duct  Diameter: 7.3 mm.  Liver:  Diffuse increased echogenicity most consistent with fatty infiltration. Focal areas of decreased echogenicity noted suggesting focal fatty sparing. Trace ascites noted.  IMPRESSION: 1. Partially contracted gallbladder with  mild thickening of the gallbladder wall. No definite gallstones noted. Negative Murphy sign. 2. Common bile duct diameter 7.3 mm. This is upper limits normal to slightly dilated. No intrahepatic ductal dilatation noted. 3. Extremely echodense liver most consistent with fatty infiltration. There appears to be areas of focal fatty sparing. 4. Trace ascites. Given patient's symptoms and these findings in may be useful to perform CT of the abdomen and pelvis for further evaluation.   Electronically Signed   By: Maisie Fus  Register   On: 06/06/2013 15:11    EKG Interpretation   None       MDM   1. Back pain   2. Abdominal pain    Patient with multiple complaints including left-sided low back pain it radiates into his left leg that is chronic. Endorses burning in his upper abdomen with dark stools this started yesterday. No fevers, numbness, tingling, bowel or bladder incontinence.  Neurologically intact on exam. Tender in the right upper quadrant and epigastrium. Guaiac is negative. Patient concerned he was taking too much Tylenol so will check level.  LFT normal. Lipase mildly elevated at 67.  [place] without evidence of cholecystitis.  APAP level undetectable.  CT scan unremarkable. FOBT negative with brown stool. Hemoglobin stable.  UA negative. On reexamination, patient has full strength in his bilateral lower legs. He is able to ambulate without assistance. This appears to be an exacerbation of his chronic back pain. He has pain medication at home. He will follow up with his PCP. He is tolerating PO and is able to ambulate.  BP 126/69  Pulse 78  Temp(Src) 97.7 F (36.5 C) (Oral)  Resp 22  Ht 6\' 3"  (1.905 m)  Wt 245 lb (111.131 kg)  BMI 30.62 kg/m2  SpO2 98%  I personally performed the services described in this documentation, which was scribed in my presence. The recorded information has been reviewed and is accurate.    Clayton Octave, MD 06/06/13 [phone]

## 2013-06-06 NOTE — ED Notes (Signed)
Taking fluids w/out difficulty and ambulated unit w/out dizziness or weakness

## 2013-09-03 ENCOUNTER — Telehealth: Payer: Self-pay | Admitting: *Deleted

## 2013-09-03 NOTE — Telephone Encounter (Signed)
Noted pt requested a copy of his PFT from 10-11-12, pt was informed this was placed up front for his pick up however noted the results are still in our office, mailed to the pt address noted in chart per pt does not have vm set up times 3. Will mail letter to advise if pt has any further questions or concerns to please contact our office

## 2013-10-13 ENCOUNTER — Telehealth: Payer: Self-pay | Admitting: Adult Health

## 2013-10-13 MED ORDER — METOPROLOL TARTRATE 25 MG PO TABS: 25.0000 mg | ORAL_TABLET | Freq: Two times a day (BID) | ORAL | Status: DC

## 2013-10-13 NOTE — Telephone Encounter (Signed)
Received fax refill request  Rx # [id number] Medication:  Metoprolol tart 25 mg tab Qty 60 Sig:  Take one tablet by mouth twice daily Physician:  [phone]

## 2013-10-13 NOTE — Telephone Encounter (Signed)
Medication sent via escribe.  

## 2013-10-21 ENCOUNTER — Encounter (HOSPITAL_COMMUNITY): Payer: Self-pay | Admitting: Emergency Medicine

## 2013-10-21 ENCOUNTER — Emergency Department (HOSPITAL_COMMUNITY)
Admission: EM | Admit: 2013-10-21 | Discharge: 2013-10-21 | Disposition: A | Discharge status: Home / Self Care | Payer: Medicaid Other | Attending: Emergency Medicine | Admitting: Emergency Medicine

## 2013-10-21 DIAGNOSIS — I10 Essential (primary) hypertension: Secondary | ICD-10-CM | POA: Insufficient documentation

## 2013-10-21 DIAGNOSIS — M545 Low back pain, unspecified: Secondary | ICD-10-CM | POA: Insufficient documentation

## 2013-10-21 DIAGNOSIS — E119 Type 2 diabetes mellitus without complications: Secondary | ICD-10-CM | POA: Insufficient documentation

## 2013-10-21 DIAGNOSIS — F17200 Nicotine dependence, unspecified, uncomplicated: Secondary | ICD-10-CM | POA: Insufficient documentation

## 2013-10-21 DIAGNOSIS — Z7982 Long term (current) use of aspirin: Secondary | ICD-10-CM | POA: Insufficient documentation

## 2013-10-21 DIAGNOSIS — F411 Generalized anxiety disorder: Secondary | ICD-10-CM | POA: Insufficient documentation

## 2013-10-21 DIAGNOSIS — I2510 Atherosclerotic heart disease of native coronary artery without angina pectoris: Secondary | ICD-10-CM | POA: Insufficient documentation

## 2013-10-21 DIAGNOSIS — M542 Cervicalgia: Secondary | ICD-10-CM | POA: Insufficient documentation

## 2013-10-21 DIAGNOSIS — G8929 Other chronic pain: Secondary | ICD-10-CM | POA: Insufficient documentation

## 2013-10-21 DIAGNOSIS — M549 Dorsalgia, unspecified: Secondary | ICD-10-CM

## 2013-10-21 DIAGNOSIS — Z79899 Other long term (current) drug therapy: Secondary | ICD-10-CM | POA: Insufficient documentation

## 2013-10-21 MED ORDER — MELOXICAM 7.5 MG PO TABS: ORAL_TABLET | ORAL | Status: DC

## 2013-10-21 MED ORDER — METHOCARBAMOL 500 MG PO TABS
1000.0000 mg | ORAL_TABLET | Freq: Once | ORAL | Status: AC
  Administered 2013-10-21: 1000 mg via ORAL
  Filled 2013-10-21: qty 2

## 2013-10-21 MED ORDER — DEXAMETHASONE SODIUM PHOSPHATE 4 MG/ML IJ SOLN
8.0000 mg | Freq: Once | INTRAMUSCULAR | Status: AC
  Administered 2013-10-21: 8 mg via INTRAMUSCULAR
  Filled 2013-10-21: qty 2

## 2013-10-21 MED ORDER — KETOROLAC TROMETHAMINE 60 MG/2ML IM SOLN
60.0000 mg | Freq: Once | INTRAMUSCULAR | Status: AC
  Administered 2013-10-21: 60 mg via INTRAMUSCULAR
  Filled 2013-10-21: qty 2

## 2013-10-21 MED ORDER — METHOCARBAMOL 500 MG PO TABS: 500.0000 mg | ORAL_TABLET | Freq: Three times a day (TID) | ORAL | Status: DC

## 2013-10-21 MED ORDER — DEXAMETHASONE 6 MG PO TABS: ORAL_TABLET | ORAL | Status: DC

## 2013-10-21 NOTE — ED Provider Notes (Signed)
Medical screening examination/treatment/procedure(s) were performed by non-physician practitioner and as supervising physician I was immediately available for consultation/collaboration.   Celene KrasJon R Salam Micucci, MD 10/21/13 [phone]

## 2013-10-21 NOTE — ED Notes (Addendum)
Pt c/o lower back pain that radiates down both legs x2 days. Pt describes pain as sharp and shooting. Pt has hx of back pain.

## 2013-10-21 NOTE — ED Notes (Signed)
Pt reports has chronic lower back pain that radiates down both legs.  Reports pain flared up a few days ago.

## 2013-10-21 NOTE — ED Provider Notes (Signed)
CSN: [id number]     Arrival date & time 10/21/13  6578 History   First MD Initiated Contact with Patient 10/21/13 0825     Chief Complaint  Patient presents with  . Back Pain     (Consider location/radiation/quality/duration/timing/severity/associated sxs/prior Treatment) Patient is a 53 y.o. male presenting with back pain. The history is provided by the patient.  Back Pain Location:  Lumbar spine Quality:  Aching Radiates to:  L thigh and R thigh Pain severity:  Moderate Pain is:  Same all the time Onset quality:  Gradual Duration:  6 months Timing:  Intermittent Progression:  Worsening Chronicity:  Chronic Context comment:  Hx of sciatica that is getting worse. Relieved by:  Nothing Worsened by:  Movement, standing, ambulation and bending Associated symptoms: no abdominal pain, no bladder incontinence, no bowel incontinence, no chest pain, no dysuria and no perianal numbness   Risk factors: no recent surgery     Past Medical History  Diagnosis Date  . Anxiety   . Hypertension   . Diabetes mellitus   . Chronic back pain   . Chronic neck pain   . Coronary artery disease     a. 2000: s/p PCI/stenting of prox LAD and RCA @ [facility];  b. 09/2012 Cath/PCI: LM 30d, LAD 40p ISR, 42m, 49m/d (2.25x20 Promus Premier DES), LCX 30p, RCA 80p (3.0x24 Promus Premier DES), patent mid stent, 38m, PDA 64m (2.75x28 Promus Premier DES), EF 60-65%.  . Hyperlipidemia   . Tobacco abuse    Past Surgical History  Procedure Laterality Date  . Back surgery    . Appendectomy    . Tonsillectomy    . Cardiac stents     Family History  Problem Relation Age of Onset  . Hypertension Father    History  Substance Use Topics  . Smoking status: Current Every Day Smoker -- 0.25 packs/day    Types: Cigarettes  . Smokeless tobacco: Not on file     Comment: ONLY SMOKES 5 CIGGS A DAY FOR 2 WEEKS  . Alcohol Use: No    Review of Systems  Constitutional: Negative for activity change.       All ROS Neg  except as noted in HPI  HENT: Negative for nosebleeds.   Eyes: Negative for photophobia and discharge.  Respiratory: Negative for cough, shortness of breath and wheezing.   Cardiovascular: Negative for chest pain and palpitations.  Gastrointestinal: Negative for abdominal pain, blood in stool and bowel incontinence.  Genitourinary: Negative for bladder incontinence, dysuria, frequency and hematuria.  Musculoskeletal: Positive for back pain and neck pain. Negative for arthralgias.  Skin: Negative.   Neurological: Negative for dizziness, seizures and speech difficulty.  Psychiatric/Behavioral: Negative for hallucinations and confusion. The patient is nervous/anxious.       Allergies  Review of patient's allergies indicates no known allergies.  Home Medications   Current Outpatient Rx  Name  Route  Sig  Dispense  Refill  . acetaminophen (TYLENOL) 500 MG tablet   Oral   Take 1,000 mg by mouth daily as needed for pain.          Marland Kitchen aspirin EC 81 MG tablet   Oral   Take 81 mg by mouth every morning.          Marland Kitchen atorvastatin (LIPITOR) 40 MG tablet   Oral   Take 40 mg by mouth daily.         . busPIRone (BUSPAR) 15 MG tablet   Oral   Take 15 mg  by mouth 2 (two) times daily.         . clonazePAM (KLONOPIN) 0.5 MG tablet   Oral   Take 0.5 mg by mouth 2 (two) times daily.         . clopidogrel (PLAVIX) 75 MG tablet   Oral   Take 1 tablet (75 mg total) by mouth daily with breakfast.   30 tablet   6   . cyclobenzaprine (FLEXERIL) 10 MG tablet   Oral   Take 10 mg by mouth 3 (three) times daily as needed. Muscle Spasms         . fish oil-omega-3 fatty acids 1000 MG capsule   Oral   Take 2 g by mouth daily.         Marland Kitchen FLUoxetine (PROZAC) 20 MG capsule   Oral   Take 20 mg by mouth daily.         . metFORMIN (GLUCOPHAGE) 1000 MG tablet   Oral   Take 1 tablet (1,000 mg total) by mouth 2 (two) times daily with a meal.   60 tablet   1     **Resume on  10/17/2012**   . metoprolol tartrate (LOPRESSOR) 25 MG tablet   Oral   Take 1 tablet (25 mg total) by mouth 2 (two) times daily.   60 tablet   6   . nitroGLYCERIN (NITROSTAT) 0.4 MG SL tablet   Sublingual   Place 1 tablet (0.4 mg total) under the tongue every 5 (five) minutes as needed for chest pain.   30 tablet   12   . ondansetron (ZOFRAN) 4 MG tablet   Oral   Take 1 tablet (4 mg total) by mouth every 6 (six) hours.   12 tablet   0   . oxyCODONE-acetaminophen (PERCOCET/ROXICET) 5-325 MG per tablet   Oral   Take 1 tablet by mouth every 4 (four) hours as needed for pain.   8 tablet   0   . pantoprazole (PROTONIX) 40 MG tablet   Oral   Take 40 mg by mouth daily.         . quinapril-hydrochlorothiazide (ACCURETIC) 20-25 MG per tablet   Oral   Take 1 tablet by mouth every morning.          BP 147/84  Pulse 77  Temp(Src) 97.6 F (36.4 C) (Oral)  Resp 18  SpO2 97% Physical Exam  Nursing note and vitals reviewed. Constitutional: He is oriented to person, place, and time. He appears well-developed and well-nourished.  Non-toxic appearance.  HENT:  Head: Normocephalic.  Right Ear: Tympanic membrane and external ear normal.  Left Ear: Tympanic membrane and external ear normal.  Eyes: EOM and lids are normal. Pupils are equal, round, and reactive to light.  Neck: Normal range of motion. Neck supple. Carotid bruit is not present.  Cardiovascular: Normal rate, regular rhythm, normal heart sounds, intact distal pulses and normal pulses.   Pulmonary/Chest: Breath sounds normal. No respiratory distress.  Abdominal: Soft. Bowel sounds are normal. There is no tenderness. There is no guarding.  Musculoskeletal:       Lumbar back: He exhibits decreased range of motion, tenderness and pain.  There is pain in the lower back with only minimal leg raise on the right and the left. There is no palpable step off of the lumbar spine area. There is paraspinal area tenderness to  palpation and with any attempted range of motion.  Lymphadenopathy:       Head (right side): No  submandibular adenopathy present.       Head (left side): No submandibular adenopathy present.    He has no cervical adenopathy.  Neurological: He is alert and oriented to person, place, and time. He has normal strength. No cranial nerve deficit or sensory deficit.  Skin: Skin is warm and dry.  Psychiatric: He has a normal mood and affect. His speech is normal.    ED Course  Procedures (including critical care time) Labs Review Labs Reviewed - No data to display Imaging Review No results found.   EKG Interpretation None      MDM Patient has a history of chronic back pain and chronic neck pain. His been no recent injury or falls. The patient is having an exacerbation of his chronic pain. No new gross neurologic deficits appreciated on examination. The plan at this time is for the patient to receive Decadron and Toradol and Robaxin here in the emergency department. Prescription for Decadron and Mobic and Robaxin will be given to the patient. Patient is advised to see his primary physicians at the health department. The patient is in the process of being referred to a pain management Center.    Final diagnoses:  None    **I have reviewed nursing notes, vital signs, and all appropriate lab and imaging results for this patient.Kathie Dike*    Ammaar Encina M Annalyse Langlais, PA-C 10/21/13 [phone]

## 2013-10-21 NOTE — Discharge Instructions (Signed)
Please use Robaxin, Mobic, and Decadron as suggested. Please see your physician at the health department for additional pain management. Please call the pain management service listed above for possible assistance with your long-term pain management goals.

## 2013-11-11 ENCOUNTER — Inpatient Hospital Stay (HOSPITAL_COMMUNITY)
Admission: EM | Admit: 2013-11-11 | Discharge: 2013-11-13 | DRG: 313 | Disposition: A | Discharge status: Home / Self Care | Payer: Medicaid Other | Attending: Internal Medicine | Admitting: Internal Medicine

## 2013-11-11 ENCOUNTER — Encounter (HOSPITAL_COMMUNITY): Payer: Self-pay | Admitting: Emergency Medicine

## 2013-11-11 ENCOUNTER — Emergency Department (HOSPITAL_COMMUNITY): Payer: Medicaid Other

## 2013-11-11 DIAGNOSIS — M549 Dorsalgia, unspecified: Secondary | ICD-10-CM

## 2013-11-11 DIAGNOSIS — K219 Gastro-esophageal reflux disease without esophagitis: Secondary | ICD-10-CM

## 2013-11-11 DIAGNOSIS — M51379 Other intervertebral disc degeneration, lumbosacral region without mention of lumbar back pain or lower extremity pain: Secondary | ICD-10-CM | POA: Diagnosis present

## 2013-11-11 DIAGNOSIS — E871 Hypo-osmolality and hyponatremia: Secondary | ICD-10-CM

## 2013-11-11 DIAGNOSIS — I200 Unstable angina: Secondary | ICD-10-CM

## 2013-11-11 DIAGNOSIS — M545 Low back pain, unspecified: Secondary | ICD-10-CM

## 2013-11-11 DIAGNOSIS — F410 Panic disorder [episodic paroxysmal anxiety] without agoraphobia: Secondary | ICD-10-CM | POA: Diagnosis present

## 2013-11-11 DIAGNOSIS — R52 Pain, unspecified: Secondary | ICD-10-CM

## 2013-11-11 DIAGNOSIS — G8929 Other chronic pain: Secondary | ICD-10-CM | POA: Diagnosis present

## 2013-11-11 DIAGNOSIS — E785 Hyperlipidemia, unspecified: Secondary | ICD-10-CM | POA: Diagnosis present

## 2013-11-11 DIAGNOSIS — R0789 Other chest pain: Principal | ICD-10-CM | POA: Diagnosis present

## 2013-11-11 DIAGNOSIS — Z9861 Coronary angioplasty status: Secondary | ICD-10-CM

## 2013-11-11 DIAGNOSIS — E876 Hypokalemia: Secondary | ICD-10-CM | POA: Diagnosis present

## 2013-11-11 DIAGNOSIS — Z720 Tobacco use: Secondary | ICD-10-CM | POA: Diagnosis present

## 2013-11-11 DIAGNOSIS — IMO0001 Reserved for inherently not codable concepts without codable children: Secondary | ICD-10-CM | POA: Diagnosis present

## 2013-11-11 DIAGNOSIS — Z8249 Family history of ischemic heart disease and other diseases of the circulatory system: Secondary | ICD-10-CM

## 2013-11-11 DIAGNOSIS — R079 Chest pain, unspecified: Secondary | ICD-10-CM

## 2013-11-11 DIAGNOSIS — F411 Generalized anxiety disorder: Secondary | ICD-10-CM | POA: Diagnosis present

## 2013-11-11 DIAGNOSIS — F17200 Nicotine dependence, unspecified, uncomplicated: Secondary | ICD-10-CM | POA: Diagnosis present

## 2013-11-11 DIAGNOSIS — I10 Essential (primary) hypertension: Secondary | ICD-10-CM | POA: Diagnosis present

## 2013-11-11 DIAGNOSIS — I2510 Atherosclerotic heart disease of native coronary artery without angina pectoris: Secondary | ICD-10-CM | POA: Diagnosis present

## 2013-11-11 DIAGNOSIS — E7800 Pure hypercholesterolemia, unspecified: Secondary | ICD-10-CM | POA: Diagnosis present

## 2013-11-11 DIAGNOSIS — E1165 Type 2 diabetes mellitus with hyperglycemia: Secondary | ICD-10-CM

## 2013-11-11 DIAGNOSIS — E119 Type 2 diabetes mellitus without complications: Secondary | ICD-10-CM | POA: Diagnosis present

## 2013-11-11 DIAGNOSIS — Z79899 Other long term (current) drug therapy: Secondary | ICD-10-CM

## 2013-11-11 DIAGNOSIS — M5137 Other intervertebral disc degeneration, lumbosacral region: Secondary | ICD-10-CM | POA: Diagnosis present

## 2013-11-11 DIAGNOSIS — Z7982 Long term (current) use of aspirin: Secondary | ICD-10-CM

## 2013-11-11 LAB — CBC WITH DIFFERENTIAL/PLATELET
BASOS PCT: 0 % (ref 0–1)
Basophils Absolute: 0 10*3/uL (ref 0.0–0.1)
EOS ABS: 0.4 10*3/uL (ref 0.0–0.7)
Eosinophils Relative: 4 % (ref 0–5)
HCT: 41.8 % (ref 39.0–52.0)
Hemoglobin: 15.2 g/dL (ref 13.0–17.0)
Lymphocytes Relative: 43 % (ref 12–46)
Lymphs Abs: 4.2 10*3/uL — ABNORMAL HIGH (ref 0.7–4.0)
MCH: 32.8 pg (ref 26.0–34.0)
MCHC: 36.4 g/dL — ABNORMAL HIGH (ref 30.0–36.0)
MCV: 90.3 fL (ref 78.0–100.0)
Monocytes Absolute: 0.9 10*3/uL (ref 0.1–1.0)
Monocytes Relative: 9 % (ref 3–12)
Neutro Abs: 4.4 10*3/uL (ref 1.7–7.7)
Neutrophils Relative %: 44 % (ref 43–77)
PLATELETS: 226 10*3/uL (ref 150–400)
RBC: 4.63 MIL/uL (ref 4.22–5.81)
RDW: 12.5 % (ref 11.5–15.5)
WBC: 9.9 10*3/uL (ref 4.0–10.5)

## 2013-11-11 LAB — BASIC METABOLIC PANEL
BUN: 9 mg/dL (ref 6–23)
CO2: 29 mEq/L (ref 19–32)
Calcium: 9.9 mg/dL (ref 8.4–10.5)
Chloride: 91 mEq/L — ABNORMAL LOW (ref 96–112)
Creatinine, Ser: 0.81 mg/dL (ref 0.50–1.35)
GFR calc Af Amer: >90 mL/min (ref >90)
Glucose, Bld: 213 mg/dL — ABNORMAL HIGH (ref 70–99)
Potassium: 3.6 mEq/L — ABNORMAL LOW (ref 3.7–5.3)
SODIUM: 135 meq/L — AB (ref 137–147)

## 2013-11-11 LAB — TROPONIN I
Troponin I: <0.3 ng/mL (ref <0.30)
Troponin I: <0.3 ng/mL (ref <0.30)

## 2013-11-11 MED ORDER — MORPHINE SULFATE 4 MG/ML IJ SOLN
4.0000 mg | Freq: Once | INTRAMUSCULAR | Status: AC
  Administered 2013-11-11: 4 mg via INTRAVENOUS
  Filled 2013-11-11: qty 1

## 2013-11-11 MED ORDER — SODIUM CHLORIDE 0.9 % IV SOLN
INTRAVENOUS | Status: DC
  Administered 2013-11-11 (×2): via INTRAVENOUS

## 2013-11-11 MED ORDER — NITROGLYCERIN 2 % TD OINT
1.0000 [in_us] | TOPICAL_OINTMENT | Freq: Once | TRANSDERMAL | Status: AC
  Administered 2013-11-11: 1 [in_us] via TOPICAL
  Filled 2013-11-11: qty 1

## 2013-11-11 MED ORDER — ASPIRIN EC 325 MG PO TBEC
325.0000 mg | DELAYED_RELEASE_TABLET | Freq: Once | ORAL | Status: AC
  Administered 2013-11-11: 325 mg via ORAL
  Filled 2013-11-11: qty 1

## 2013-11-11 MED ORDER — DEXAMETHASONE SODIUM PHOSPHATE 4 MG/ML IJ SOLN
10.0000 mg | Freq: Once | INTRAMUSCULAR | Status: AC
  Administered 2013-11-11: 10 mg via INTRAVENOUS
  Filled 2013-11-11: qty 3

## 2013-11-11 MED ORDER — ONDANSETRON HCL 4 MG/2ML IJ SOLN
4.0000 mg | Freq: Once | INTRAMUSCULAR | Status: AC
  Administered 2013-11-11: 4 mg via INTRAVENOUS
  Filled 2013-11-11: qty 2

## 2013-11-11 NOTE — ED Notes (Signed)
In to recheck patients vital signs. Vital signs within normal limits. Patients family member at bedside. Pt states he is doing okay but still in a little bit of pain.

## 2013-11-11 NOTE — ED Notes (Signed)
In to recheck patients vitals. Vital signs within normal limits. Pt stated he seemed to be feeling much better. Family at bedside.

## 2013-11-11 NOTE — ED Notes (Signed)
In to check patients vitals, vital signs within normal limits. Family at bedside. Call bell etc within reach but patient complaining of pain 8 of 10. Will notifiy nurse.

## 2013-11-11 NOTE — ED Provider Notes (Signed)
CSN: [id number]     Arrival date & time 11/11/13  1811 History  This chart was scribed for Ward Givens, MD by Bennett Scrape, ED Scribe. This patient was seen in room APA11/APA11 and the patient's care was started at 7:11 PM.   Chief Complaint  Patient presents with  . Chest Pain  . Leg Pain    The history is provided by the patient. No language interpreter was used.   HPI Comments: Clayton Foster is a 53 y.o. male who presents to the Emergency Department complaining of "excruciating" left leg pain described as burning 3 days ago. He states he fell after missing one step and sitting down directly onto his butt while walking down steps. He admits to having chronic left lower back pain that radiates down the left leg due to DDD and a pinched nerve diagnosed by MRI prior to the fall and states that the fall has aggravated symptoms. He reports that the pain is exacerbated by "all movement especially sitting" but denies any improving factors. He admits that he was suppose to have surgery last April  but had several cardiac stents placed and the back surgery was cancelled.  He has since been following up with the [facility] since last October and has received 3 epidurals without improvement in pain. He reports running out of his Oxycodone 90 pill prescription that he received once a month from the [facility] in Jan 2015. He reports that his aunt was giving him a ride but has been unable to continue to do this. He states that he doesn't have a license and his mother is uncomfortable driving outside of [place]. He reports that he is now following up with [facility] to get a local chronic pain PCP. He admits that he was seen in the ED last month for the same chronic pain and was given a prescription for Percocet which he has since finished. He is a 0.75 ppd smoker but denies any alcohol.   Pt has a secondary complaint of substernal CP that started last night. He states that  the pain is a pressure-like burning sensation that has been intermittent and non-radiating since the onset. He reports having episodes lasting 10 to 15 minutes and he reports experiencing 4 to 5 episodes today. He reports trying to "squeeze" his arms together to "mash" his chest muscles but was unable to tell improvement. He has a h/o of 6 prior cardiac stent placements, 1 placed at [facility] and 3 at [facility]. He states that his symptoms are similiar to his prior cardiac issues; although, he is a poor historian at explaining his prior cardiac symptoms. He admits to taking three 81 mg ASA this morning with mild improvement. He denies any SOB, diaphoresis, nausea or emesis as associated symptoms. He states that he has an appointment with [facility] in 3 days. He also states he gets chest pain with panic attacks.   PCP is [facility]  Card[facility] [place]  Past Medical History  Diagnosis Date  . Anxiety   . Hypertension   . Diabetes mellitus   . Chronic back pain   . Chronic neck pain   . Coronary artery disease     a. 2000: s/p PCI/stenting of prox LAD and RCA @ [facility];  b. 09/2012 Cath/PCI: LM 30d, LAD 40p ISR, 59m, 3m/d (2.25x20 Promus Premier DES), LCX 30p, RCA 80p (3.0x24 Promus Premier DES), patent mid stent, 2m, PDA 41m (2.75x28 Promus Premier DES),  EF 60-65%.  . Hyperlipidemia   . Tobacco abuse    Past Surgical History  Procedure Laterality Date  . Back surgery    . Appendectomy    . Tonsillectomy    . Cardiac stents     Family History  Problem Relation Age of Onset  . Hypertension Father    History  Substance Use Topics  . Smoking status: Current Every Day Smoker -- 0.25 packs/day    Types: Cigarettes  . Smokeless tobacco: Not on file     Comment: ONLY SMOKES 5 CIGGS A DAY FOR 2 WEEKS  . Alcohol Use: No  unemployed On medicaid, seeking disability  Lives at home Lives with spouse  Review of Systems  All other systems reviewed and are  negative.    Allergies  Review of patient's allergies indicates no known allergies.  Home Medications   Prior to Admission medications   Medication Sig Start Date End Date Taking? Authorizing Provider  acetaminophen (TYLENOL) 500 MG tablet Take 1,000 mg by mouth daily as needed for pain.    Yes Historical Provider, MD  aspirin EC 81 MG tablet Take 81 mg by mouth every morning.    Yes Historical Provider, MD  atorvastatin (LIPITOR) 40 MG tablet Take 40 mg by mouth daily.   Yes Historical Provider, MD  busPIRone (BUSPAR) 15 MG tablet Take 15 mg by mouth 2 (two) times daily.   Yes Historical Provider, MD  clonazePAM (KLONOPIN) 0.5 MG tablet Take 0.5 mg by mouth 2 (two) times daily.   Yes Historical Provider, MD  cyclobenzaprine (FLEXERIL) 10 MG tablet Take 10 mg by mouth 3 (three) times daily as needed. Muscle Spasms   Yes Historical Provider, MD  FLUoxetine (PROZAC) 20 MG capsule Take 20 mg by mouth daily.   Yes Historical Provider, MD  metFORMIN (GLUCOPHAGE) 1000 MG tablet Take 1 tablet (1,000 mg total) by mouth 2 (two) times daily with a meal. 10/15/12  Yes Ok Anishristopher R Berge, NP  metoprolol tartrate (LOPRESSOR) 25 MG tablet Take 1 tablet (25 mg total) by mouth 2 (two) times daily. 10/13/13  Yes Jodelle GrossKathryn M Lawrence, NP  oxyCODONE-acetaminophen (PERCOCET/ROXICET) 5-325 MG per tablet Take 1 tablet by mouth every 4 (four) hours as needed for pain. 11/01/12  Yes Tammy L. Triplett, PA-C  pantoprazole (PROTONIX) 40 MG tablet Take 40 mg by mouth daily.   Yes Historical Provider, MD  quinapril-hydrochlorothiazide (ACCURETIC) 20-25 MG per tablet Take 1 tablet by mouth every morning. 09/28/11  Yes Lamar LaundryLaura C Mayans, MD  nitroGLYCERIN (NITROSTAT) 0.4 MG SL tablet Place 1 tablet (0.4 mg total) under the tongue every 5 (five) minutes as needed for chest pain. 07/23/12   Erick BlinksJehanzeb Memon, MD   Triage vitals: BP 148/96  Pulse 104  Temp(Src) 98.1 F (36.7 C) (Oral)  Resp 18  Ht 6\' 3"  (1.905 m)  Wt 236 lb 5 oz  (107.191 kg)  BMI 29.54 kg/m2  SpO2 99%  Vital signs normal except tachycardia   Physical Exam  Nursing note and vitals reviewed. Constitutional: He is oriented to person, place, and time. He appears well-developed and well-nourished.  HENT:  Head: Normocephalic and atraumatic.  Eyes: Conjunctivae and EOM are normal. Pupils are equal, round, and reactive to light.  Neck: Normal range of motion. Neck supple.  Cardiovascular: Normal rate, regular rhythm and normal heart sounds.   Pulmonary/Chest: Effort normal and breath sounds normal.  Musculoskeletal:       Lumbar back: He exhibits no swelling, no edema and no  deformity.  Non-tender thoracic or lumbar spine tenderness, tender over the coccyx Negative SLR on the left  Neurological: He is alert and oriented to person, place, and time.  Skin: Skin is warm, dry and intact. He is not diaphoretic. No erythema. No pallor.  Psychiatric: His mood appears anxious. He is agitated.  Leg shaking on the left    ED Course  Procedures (including critical care time)  Medications  0.9 %  sodium chloride infusion ( Intravenous New Bag/Given 11/11/13 2217)  dexamethasone (DECADRON) injection 10 mg (10 mg Intravenous Given 11/11/13 1956)  nitroGLYCERIN (NITROGLYN) 2 % ointment 1 inch (1 inch Topical Given 11/11/13 1955)  aspirin EC tablet 325 mg (325 mg Oral Given 11/11/13 1955)  morphine 4 MG/ML injection 4 mg (4 mg Intravenous Given 11/11/13 1956)  ondansetron (ZOFRAN) injection 4 mg (4 mg Intravenous Given 11/11/13 1956)  morphine 4 MG/ML injection 4 mg (4 mg Intravenous Given 11/11/13 2217)    DIAGNOSTIC STUDIES: Oxygen Saturation is 99% on RA, Normal by my interpretation.    COORDINATION OF CARE: 7:13 PM-Discussed treatment plan which includes medications, x-ray of sacrum/coccyx and troponin with pt at bedside and pt agreed to plan.   9:35 PM- Pt reports having chest pain that started when I left the room after his initial interview and states  that he told the nurse. Informed pt of EKG changes, normal radiology and lab work results. Discussed treatment plan which includes repeating EKG and consulted with carddologist pt and pt agreed to plan. Also advised pt to follow up as needed and pt agreed. Addressed symptoms to return for with pt.   23:26 D/W Dr Jon Billings, Cardiology, has reviewed EKG's feels patient can stay here and be seen by the cardiologist in the morning. Does not feel he needs to be started on heparin. Feels some of the changes are rate related, recommends control of his pain and anxiety.   23:41 Dr Rito Ehrlich, admit to tele, team 2  Labs Review Results for orders placed during the hospital encounter of 11/11/13  TROPONIN I      Result Value Ref Range   Troponin I <0.30  <0.30 ng/mL  CBC WITH DIFFERENTIAL      Result Value Ref Range   WBC 9.9  4.0 - 10.5 K/uL   RBC 4.63  4.22 - 5.81 MIL/uL   Hemoglobin 15.2  13.0 - 17.0 g/dL   HCT 16.1  09.6 - 04.5 %   MCV 90.3  78.0 - 100.0 fL   MCH 32.8  26.0 - 34.0 pg   MCHC 36.4 (*) 30.0 - 36.0 g/dL   RDW 40.9  81.1 - 91.4 %   Platelets 226  150 - 400 K/uL   Neutrophils Relative % 44  43 - 77 %   Neutro Abs 4.4  1.7 - 7.7 K/uL   Lymphocytes Relative 43  12 - 46 %   Lymphs Abs 4.2 (*) 0.7 - 4.0 K/uL   Monocytes Relative 9  3 - 12 %   Monocytes Absolute 0.9  0.1 - 1.0 K/uL   Eosinophils Relative 4  0 - 5 %   Eosinophils Absolute 0.4  0.0 - 0.7 K/uL   Basophils Relative 0  0 - 1 %   Basophils Absolute 0.0  0.0 - 0.1 K/uL  BASIC METABOLIC PANEL      Result Value Ref Range   Sodium 135 (*) 137 - 147 mEq/L   Potassium 3.6 (*) 3.7 - 5.3 mEq/L   Chloride 91 (*)  96 - 112 mEq/L   CO2 29  19 - 32 mEq/L   Glucose, Bld 213 (*) 70 - 99 mg/dL   BUN 9  6 - 23 mg/dL   Creatinine, Ser 4.090.81  0.50 - 1.35 mg/dL   Calcium 9.9  8.4 - 81.110.5 mg/dL   GFR calc non Af Amer >90  >90 mL/min   GFR calc Af Amer >90  >90 mL/min  TROPONIN I      Result Value Ref Range   Troponin I <0.30  <0.30 ng/mL    Laboratory interpretation all normal except for hyperglycemia, mild hyponatremia, mild hypokalemia, low chloride     Imaging Review Dg Sacrum/coccyx  11/11/2013   CLINICAL DATA:  fell in sitting position  EXAM: SACRUM AND COCCYX - 2+ VIEW  COMPARISON:  None.  FINDINGS: There is no evidence of fracture or other focal bone lesions. Degenerative disc disease changes are appreciated at L5-S1.  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 No acute osseous abnormalities.   Electronically Signed   By: Salome HolmesHector  Cooper M.D.   On: 11/11/2013 20:48     EKG Interpretation   Date/Time:  Tuesday November 11 2013 18:23:11 EDT Ventricular Rate:  104 PR Interval:  130 QRS Duration: 98 QT Interval:  342 QTC Calculation: 449 R Axis:   -42 Text Interpretation:  Sinus tachycardia Left axis deviation When compared  with ECG of 15-Oct-2012 05:23, Vent. rate has increased BY  40 BPM T wave  inversion now evident in Inferior leads and lateral leads Confirmed by  Emrey Thornley  MD-I, Labrittany Wechter ([id number]) on 11/11/2013 7:29:18 PM   # 2 EKG   Date: 11/11/2013  Rate: 73  Rhythm: normal sinus rhythm  QRS Axis: left  Intervals: normal  ST/T Wave abnormalities: nonspecific T wave changes  Conduction Disutrbances:none  Narrative Interpretation:   Old EKG Reviewed: changes noted from EKG earlier today, TWI now gone in inf-lateral leads      MDM   Final diagnoses:  Chest pain  Acute exacerbation of chronic low back pain   Plan admission   Devoria AlbeIva Jamesyn Lindell, MD, FACEP   I personally performed the services described in this documentation, which was scribed in my presence. The recorded information has been reviewed and considered.  Devoria AlbeIva Navada Osterhout, MD, FACEP      Ward GivensIva L Ermal Haberer, MD 11/12/13 [phone]

## 2013-11-11 NOTE — ED Notes (Signed)
Pt reports has herniated disc in back and has chronic pain in left leg and lower back.  Reports started having chest pain last night.    Describes pain as pressure.  Denies n/v.  Denies SOB.  Says when pain gets bad in back and leg he hyperventilates.

## 2013-11-12 ENCOUNTER — Encounter (HOSPITAL_COMMUNITY): Payer: Self-pay

## 2013-11-12 ENCOUNTER — Inpatient Hospital Stay (HOSPITAL_COMMUNITY): Payer: Medicaid Other

## 2013-11-12 ENCOUNTER — Encounter (HOSPITAL_COMMUNITY): Payer: Self-pay | Admitting: Internal Medicine

## 2013-11-12 DIAGNOSIS — R072 Precordial pain: Secondary | ICD-10-CM

## 2013-11-12 DIAGNOSIS — M549 Dorsalgia, unspecified: Secondary | ICD-10-CM

## 2013-11-12 LAB — COMPREHENSIVE METABOLIC PANEL
ALBUMIN: 3.7 g/dL (ref 3.5–5.2)
ALT: 54 U/L — AB (ref 0–53)
AST: 44 U/L — ABNORMAL HIGH (ref 0–37)
Alkaline Phosphatase: 89 U/L (ref 39–117)
BILIRUBIN TOTAL: 1 mg/dL (ref 0.3–1.2)
BUN: 12 mg/dL (ref 6–23)
CO2: 24 mEq/L (ref 19–32)
Calcium: 9.5 mg/dL (ref 8.4–10.5)
Chloride: 98 mEq/L (ref 96–112)
Creatinine, Ser: 0.77 mg/dL (ref 0.50–1.35)
GFR calc Af Amer: >90 mL/min (ref >90)
GFR calc non Af Amer: >90 mL/min (ref >90)
Glucose, Bld: 367 mg/dL — ABNORMAL HIGH (ref 70–99)
Potassium: 5.9 mEq/L — ABNORMAL HIGH (ref 3.7–5.3)
SODIUM: 135 meq/L — AB (ref 137–147)
TOTAL PROTEIN: 7.3 g/dL (ref 6.0–8.3)

## 2013-11-12 LAB — GLUCOSE, CAPILLARY
GLUCOSE-CAPILLARY: 260 mg/dL — AB (ref 70–99)
Glucose-Capillary: 300 mg/dL — ABNORMAL HIGH (ref 70–99)
Glucose-Capillary: 293 mg/dL — ABNORMAL HIGH (ref 70–99)
Glucose-Capillary: 312 mg/dL — ABNORMAL HIGH (ref 70–99)

## 2013-11-12 LAB — CBC
HEMATOCRIT: 39.5 % (ref 39.0–52.0)
Hemoglobin: 14.2 g/dL (ref 13.0–17.0)
MCH: 32.6 pg (ref 26.0–34.0)
MCHC: 35.9 g/dL (ref 30.0–36.0)
MCV: 90.6 fL (ref 78.0–100.0)
PLATELETS: 225 10*3/uL (ref 150–400)
RBC: 4.36 MIL/uL (ref 4.22–5.81)
RDW: 12.5 % (ref 11.5–15.5)
WBC: 9.2 10*3/uL (ref 4.0–10.5)

## 2013-11-12 LAB — TROPONIN I: Troponin I: <0.3 ng/mL (ref <0.30)

## 2013-11-12 LAB — HEMOGLOBIN A1C
Hgb A1c MFr Bld: 8.2 % — ABNORMAL HIGH (ref <5.7)
Mean Plasma Glucose: 189 mg/dL — ABNORMAL HIGH (ref <117)

## 2013-11-12 LAB — HEPARIN LEVEL (UNFRACTIONATED): HEPARIN UNFRACTIONATED: 0.1 [IU]/mL — AB (ref 0.30–0.70)

## 2013-11-12 MED ORDER — ALBUTEROL SULFATE (2.5 MG/3ML) 0.083% IN NEBU: 2.5000 mg | INHALATION_SOLUTION | RESPIRATORY_TRACT | Status: DC | PRN

## 2013-11-12 MED ORDER — CYCLOBENZAPRINE HCL 10 MG PO TABS
10.0000 mg | ORAL_TABLET | Freq: Three times a day (TID) | ORAL | Status: DC | PRN
  Administered 2013-11-12 – 2013-11-13 (×3): 10 mg via ORAL
  Filled 2013-11-12 (×3): qty 1

## 2013-11-12 MED ORDER — ONDANSETRON HCL 4 MG PO TABS: 4.0000 mg | ORAL_TABLET | Freq: Four times a day (QID) | ORAL | Status: DC | PRN

## 2013-11-12 MED ORDER — PANTOPRAZOLE SODIUM 40 MG PO TBEC
40.0000 mg | DELAYED_RELEASE_TABLET | Freq: Every day | ORAL | Status: DC
  Administered 2013-11-12 – 2013-11-13 (×2): 40 mg via ORAL
  Filled 2013-11-12 (×2): qty 1

## 2013-11-12 MED ORDER — INSULIN ASPART 100 UNIT/ML ~~LOC~~ SOLN
0.0000 [IU] | Freq: Three times a day (TID) | SUBCUTANEOUS | Status: DC
  Administered 2013-11-12 (×2): 5 [IU] via SUBCUTANEOUS

## 2013-11-12 MED ORDER — ASPIRIN EC 81 MG PO TBEC
81.0000 mg | DELAYED_RELEASE_TABLET | Freq: Every morning | ORAL | Status: DC
  Administered 2013-11-12 – 2013-11-13 (×2): 81 mg via ORAL
  Filled 2013-11-12 (×2): qty 1

## 2013-11-12 MED ORDER — SODIUM CHLORIDE 0.9 % IJ SOLN: 3.0000 mL | Freq: Two times a day (BID) | INTRAMUSCULAR | Status: DC

## 2013-11-12 MED ORDER — INSULIN ASPART 100 UNIT/ML ~~LOC~~ SOLN
4.0000 [IU] | Freq: Three times a day (TID) | SUBCUTANEOUS | Status: DC
  Administered 2013-11-12 – 2013-11-13 (×3): 4 [IU] via SUBCUTANEOUS

## 2013-11-12 MED ORDER — DOCUSATE SODIUM 100 MG PO CAPS
100.0000 mg | ORAL_CAPSULE | Freq: Two times a day (BID) | ORAL | Status: DC
  Administered 2013-11-12 – 2013-11-13 (×4): 100 mg via ORAL
  Filled 2013-11-12 (×4): qty 1

## 2013-11-12 MED ORDER — HEPARIN SODIUM (PORCINE) 5000 UNIT/ML IJ SOLN
5000.0000 [IU] | Freq: Three times a day (TID) | INTRAMUSCULAR | Status: DC
  Administered 2013-11-12: 5000 [IU] via SUBCUTANEOUS
  Filled 2013-11-12 (×2): qty 1

## 2013-11-12 MED ORDER — HEPARIN BOLUS VIA INFUSION
4000.0000 [IU] | Freq: Once | INTRAVENOUS | Status: AC
  Administered 2013-11-12: 4000 [IU] via INTRAVENOUS
  Filled 2013-11-12: qty 4000

## 2013-11-12 MED ORDER — INSULIN DETEMIR 100 UNIT/ML ~~LOC~~ SOLN
5.0000 [IU] | Freq: Every day | SUBCUTANEOUS | Status: DC
  Administered 2013-11-12: 5 [IU] via SUBCUTANEOUS
  Filled 2013-11-12 (×2): qty 0.05

## 2013-11-12 MED ORDER — MORPHINE SULFATE 2 MG/ML IJ SOLN
2.0000 mg | INTRAMUSCULAR | Status: DC | PRN
  Administered 2013-11-12 – 2013-11-13 (×8): 2 mg via INTRAVENOUS
  Filled 2013-11-12 (×8): qty 1

## 2013-11-12 MED ORDER — HEPARIN BOLUS VIA INFUSION
2000.0000 [IU] | Freq: Once | INTRAVENOUS | Status: AC
  Administered 2013-11-12: 2000 [IU] via INTRAVENOUS
  Filled 2013-11-12: qty 2000

## 2013-11-12 MED ORDER — HYDROCHLOROTHIAZIDE 25 MG PO TABS
25.0000 mg | ORAL_TABLET | Freq: Every day | ORAL | Status: DC
  Administered 2013-11-12 – 2013-11-13 (×2): 25 mg via ORAL
  Filled 2013-11-12 (×2): qty 1

## 2013-11-12 MED ORDER — ACETAMINOPHEN 325 MG PO TABS: 650.0000 mg | ORAL_TABLET | Freq: Four times a day (QID) | ORAL | Status: DC | PRN

## 2013-11-12 MED ORDER — FLUOXETINE HCL 20 MG PO CAPS
20.0000 mg | ORAL_CAPSULE | Freq: Every day | ORAL | Status: DC
  Administered 2013-11-12 – 2013-11-13 (×2): 20 mg via ORAL
  Filled 2013-11-12 (×2): qty 1

## 2013-11-12 MED ORDER — NITROGLYCERIN 2 % TD OINT
1.0000 [in_us] | TOPICAL_OINTMENT | Freq: Four times a day (QID) | TRANSDERMAL | Status: DC
  Administered 2013-11-12 – 2013-11-13 (×5): 1 [in_us] via TOPICAL
  Filled 2013-11-12 (×5): qty 1

## 2013-11-12 MED ORDER — OXYCODONE-ACETAMINOPHEN 5-325 MG PO TABS
1.0000 | ORAL_TABLET | ORAL | Status: DC | PRN
  Administered 2013-11-12 (×2): 2 via ORAL
  Administered 2013-11-12: 1 via ORAL
  Administered 2013-11-12 – 2013-11-13 (×3): 2 via ORAL
  Filled 2013-11-12: qty 1
  Filled 2013-11-12 (×5): qty 2

## 2013-11-12 MED ORDER — INSULIN ASPART 100 UNIT/ML ~~LOC~~ SOLN
0.0000 [IU] | Freq: Three times a day (TID) | SUBCUTANEOUS | Status: DC
  Administered 2013-11-12: 8 [IU] via SUBCUTANEOUS
  Administered 2013-11-13: 5 [IU] via SUBCUTANEOUS
  Administered 2013-11-13: 8 [IU] via SUBCUTANEOUS

## 2013-11-12 MED ORDER — QUINAPRIL-HYDROCHLOROTHIAZIDE 20-25 MG PO TABS: 1.0000 | ORAL_TABLET | Freq: Every morning | ORAL | Status: DC

## 2013-11-12 MED ORDER — CLONAZEPAM 0.5 MG PO TABS
0.5000 mg | ORAL_TABLET | Freq: Two times a day (BID) | ORAL | Status: DC
  Administered 2013-11-12 – 2013-11-13 (×4): 0.5 mg via ORAL
  Filled 2013-11-12 (×4): qty 1

## 2013-11-12 MED ORDER — METOPROLOL TARTRATE 25 MG PO TABS
25.0000 mg | ORAL_TABLET | Freq: Two times a day (BID) | ORAL | Status: DC
  Administered 2013-11-12 – 2013-11-13 (×4): 25 mg via ORAL
  Filled 2013-11-12 (×4): qty 1

## 2013-11-12 MED ORDER — NICOTINE 14 MG/24HR TD PT24
14.0000 mg | MEDICATED_PATCH | Freq: Every day | TRANSDERMAL | Status: DC
  Administered 2013-11-12 – 2013-11-13 (×2): 14 mg via TRANSDERMAL
  Filled 2013-11-12 (×2): qty 1

## 2013-11-12 MED ORDER — ONDANSETRON HCL 4 MG/2ML IJ SOLN
4.0000 mg | Freq: Four times a day (QID) | INTRAMUSCULAR | Status: DC | PRN
  Administered 2013-11-13: 4 mg via INTRAVENOUS
  Filled 2013-11-12: qty 2

## 2013-11-12 MED ORDER — SODIUM CHLORIDE 0.9 % IJ SOLN: 3.0000 mL | INTRAMUSCULAR | Status: DC | PRN

## 2013-11-12 MED ORDER — HEPARIN (PORCINE) IN NACL 100-0.45 UNIT/ML-% IJ SOLN
1500.0000 [IU]/h | INTRAMUSCULAR | Status: DC
  Administered 2013-11-12: 1250 [IU]/h via INTRAVENOUS
  Administered 2013-11-13: 1500 [IU]/h via INTRAVENOUS
  Filled 2013-11-12 (×2): qty 250

## 2013-11-12 MED ORDER — ATORVASTATIN CALCIUM 40 MG PO TABS
40.0000 mg | ORAL_TABLET | Freq: Every day | ORAL | Status: DC
  Administered 2013-11-12 – 2013-11-13 (×2): 40 mg via ORAL
  Filled 2013-11-12 (×2): qty 1

## 2013-11-12 MED ORDER — BUSPIRONE HCL 5 MG PO TABS
15.0000 mg | ORAL_TABLET | Freq: Two times a day (BID) | ORAL | Status: DC
Start: 2013-11-12 — End: 2013-11-13
  Administered 2013-11-12 – 2013-11-13 (×4): 15 mg via ORAL
  Filled 2013-11-12 (×4): qty 3

## 2013-11-12 MED ORDER — POTASSIUM CHLORIDE CRYS ER 20 MEQ PO TBCR
40.0000 meq | EXTENDED_RELEASE_TABLET | Freq: Once | ORAL | Status: AC
  Administered 2013-11-12: 40 meq via ORAL
  Filled 2013-11-12: qty 2

## 2013-11-12 MED ORDER — ACETAMINOPHEN 650 MG RE SUPP: 650.0000 mg | Freq: Four times a day (QID) | RECTAL | Status: DC | PRN

## 2013-11-12 MED ORDER — LISINOPRIL 10 MG PO TABS
20.0000 mg | ORAL_TABLET | Freq: Every day | ORAL | Status: DC
  Administered 2013-11-12 – 2013-11-13 (×2): 20 mg via ORAL
  Filled 2013-11-12 (×2): qty 2

## 2013-11-12 MED ORDER — SODIUM CHLORIDE 0.9 % IV SOLN: 250.0000 mL | INTRAVENOUS | Status: DC | PRN

## 2013-11-12 NOTE — Progress Notes (Signed)
[facility] PROGRESS NOTE  Clayton LevyRichard W Foster ZOX:[id number] DOB: [birth year] DOA: 11/11/2013 PCP: Foster,Clayton D., PA-C  Assessment/Plan: Chest Pain -With h/o CAD, possible new TWI inferior leads and seld DC on plavix before 1 year; agree he is high risk for ACS. -Has been started on a heparin drip with plans for an ECHO and stress myoview in am. -Appreciate cardiology input and recommendations.  Back Pain -Is mostly chronic. -Back surgery has been deferred due to his ongoing cardiac issues. -[facility] from a cardiac perspective.  HTN -Well controlled.  DM II -Uncontrolled. -Increase SSI to moderate and start low dose levemir 5 units.  Hypokalemia -Over corrected. -No further supplementation. -Follow and recheck in am.  Code Status: Full Code Family Communication: Patient only  Disposition Plan: Home when ready   Consultants:  Cardiology, Dr. Wyline Foster   Antibiotics:  None   Subjective: Still with back>chest pain.  Objective: Filed Vitals:   11/12/13 0008 11/12/13 0059 11/12/13 0131 11/12/13 1059  BP:  101/57 135/93 110/74  Pulse: 82 79 84 82  Temp: 97.9 F (36.6 C) 98.1 F (36.7 C) 97.4 F (36.3 C)   TempSrc: Oral Oral Oral   Resp: 20 20 20    Height:   6\' 3"  (1.905 m)   Weight:   105.733 kg (233 lb 1.6 oz)   SpO2: 97% 97% 97%     Intake/Output Summary (Last 24 hours) at 11/12/13 1335 Last data filed at 11/12/13 1300  Gross per 24 hour  Intake 1386.67 ml  Output      0 ml  Net 1386.67 ml   Filed Weights   11/11/13 1820 11/12/13 0131  Weight: 107.191 kg (236 lb 5 oz) 105.733 kg (233 lb 1.6 oz)    Exam:   General:  AA Ox3  Cardiovascular: RRR  Respiratory: CTA B  Abdomen: S/NT/ND/+BS  Extremities: no C/C/E   Neurologic:  Non-focal  Data Reviewed: Basic Metabolic Panel:  Recent Labs Lab 11/11/13 2031 11/12/13 0537  NA 135* 135*  K 3.6* 5.9*  CL 91* 98  CO2 29 24  GLUCOSE 213* 367*  BUN 9 12    CREATININE 0.81 0.77  CALCIUM 9.9 9.5   Liver Function Tests:  Recent Labs Lab 11/12/13 0537  AST 44*  ALT 54*  ALKPHOS 89  BILITOT 1.0  PROT 7.3  ALBUMIN 3.7   No results found for this basename: LIPASE, AMYLASE,  in the last 168 hours No results found for this basename: AMMONIA,  in the last 168 hours CBC:  Recent Labs Lab 11/11/13 2031 11/12/13 0537  WBC 9.9 9.2  NEUTROABS 4.4  --   HGB 15.2 14.2  HCT 41.8 39.5  MCV 90.3 90.6  PLT 226 225   Cardiac Enzymes:  Recent Labs Lab 11/11/13 1949 11/11/13 2301 11/12/13 0537 11/12/13 1054  TROPONINI <0.30 <0.30 <0.30 <0.30   BNP (last 3 results) No results found for this basename: PROBNP,  in the last 8760 hours CBG:  Recent Labs Lab 11/12/13 0725 11/12/13 1125  GLUCAP 300* 260*    No results found for this or any previous visit (from the past 240 hour(s)).   Studies: Dg Sacrum/coccyx  11/11/2013   CLINICAL DATA:  fell in sitting position  EXAM: SACRUM AND COCCYX - 2+ VIEW  COMPARISON:  None.  FINDINGS: There is no evidence of fracture or other focal bone lesions. Degenerative disc disease changes are appreciated at L5-S1.  IMPRESSION: Degenerative disc disease changes lower lumbar  spine. No acute osseous abnormalities.   Electronically Signed   By: Salome HolmesHector  Foster M.D.   On: 11/11/2013 20:48    Scheduled Meds: . aspirin EC  81 mg Oral q morning - 10a  . atorvastatin  40 mg Oral Daily  . busPIRone  15 mg Oral BID  . clonazePAM  0.5 mg Oral BID  . docusate sodium  100 mg Oral BID  . FLUoxetine  20 mg Oral Daily  . hydrochlorothiazide  25 mg Oral Daily  . insulin aspart  0-9 Units Subcutaneous TID WC  . lisinopril  20 mg Oral Daily  . metoprolol tartrate  25 mg Oral BID  . nicotine  14 mg Transdermal Daily  . nitroGLYCERIN  1 inch Topical 4 times per day  . pantoprazole  40 mg Oral Daily  . sodium chloride  3 mL Intravenous Q12H  . sodium chloride  3 mL Intravenous Q12H   Continuous Infusions: .  heparin 1,250 Units/hr (11/12/13 1245)    Principal Problem:   Chest pain Active Problems:   Diabetes mellitus, type 2   Hypertension   High cholesterol   Back pain with radiculopathy   CAD (coronary artery disease)   Tobacco use    Time spent: 35 minutes. Greater than 50% of this time was spent in direct contact with the patient coordinating care.    Henderson Cloudstela Y Hernandez Foster  [facility] Pager [phone]  If 7PM-7AM, please contact night-coverage at [email or web address], password [email or web address] 11/12/2013, 1:35 PM  LOS: 1 day

## 2013-11-12 NOTE — Progress Notes (Signed)
*  PRELIMINARY RESULTS* Echocardiogram 2D Echocardiogram has been performed.  Katheren PullerJohanna R Monroe Qin 11/12/2013, 4:13 PM

## 2013-11-12 NOTE — Progress Notes (Addendum)
Attempted to call report. Nurse not availiable. Gave number to return when back on floor .

## 2013-11-12 NOTE — Progress Notes (Signed)
Inpatient Diabetes Program Recommendations  AACE/ADA: New Consensus Statement on Inpatient Glycemic Control (2013)  Target Ranges:  Prepandial:   less than 140 mg/dL      Peak postprandial:   less than 180 mg/dL (1-2 hours)      Critically ill patients:  140 - 180 mg/dL   Results for Tawni LevyCRIDER, Samantha W (MRN [id number]) as of 11/12/2013 09:34  Ref. Range 11/11/2013 20:31 11/12/2013 05:37  Glucose Latest Range: 70-99 mg/dL 409213 (H) 811367 (H)   Diabetes history: DM2 Outpatient Diabetes medications: Metformin 1000 mg BID Current orders for Inpatient glycemic control: [facility]  Inpatient Diabetes Program Recommendations Insulin - Basal: May want to consider ordering low dose basal insulin; recommend starting with Levemir 10 units daily. Correction (SSI): Please consider increasing Novolog correction to moderate scale and ordering Novolog bedtime correction scale.  Thanks, Orlando PennerMarie Yicel Shannon, RN, MSN, CCRN Diabetes Coordinator Inpatient Diabetes Program [phone] (Team Pager) [phone] (AP office) [phone] [facility] office)

## 2013-11-12 NOTE — Care Management Note (Signed)
    Page 1 of 1   11/12/2013     2:01:46 PM CARE MANAGEMENT NOTE 11/12/2013  Patient:  Clayton Foster,Green W   Account Number:  [id number]  Date Initiated:  11/12/2013  Documentation initiated by:  Rosemary HolmsOBSON,Regan Llorente  Subjective/Objective Assessment:   Pt lives at home with his mother. [facility] and is a new client of [facility])     Action/Plan:   Anticipated DC Date:  11/13/2013   Anticipated DC Plan:  HOME/SELF CARE      DC Planning Services  CM consult      Choice offered to / List presented to:             Status of service:  Completed, signed off Medicare Important Message given?   (If response is "NO", the following Medicare IM given date fields will be blank) Date Medicare IM given:   Date Additional Medicare IM given:    Discharge Disposition:    Per UR Regulation:    If discussed at Long Length of Stay Meetings, dates discussed:    Comments:  11/12/13 Rosemary HolmsAmy Kiana Hollar RN BSN CM [facility]13. Pt needs to change the PCP on [facility] card to reflect this provider.

## 2013-11-12 NOTE — Progress Notes (Addendum)
ANTICOAGULATION CONSULT NOTE - Initial Consult  Pharmacy Consult for Heparin Indication: chest pain/ACS  No Known Allergies  Patient Measurements: Height: 6\' 3"  (190.5 cm) Weight: 233 lb 1.6 oz (105.733 kg) IBW/kg (Calculated) : 84.5 Heparin Dosing Weight: 105 kg  Vital Signs: Temp: 97.4 F (36.3 C) (04/22 0131) Temp src: Oral (04/22 0131) BP: 110/74 mmHg (04/22 1059) Pulse Rate: 82 (04/22 1059)  Labs:  Recent Labs  11/11/13 2031 11/11/13 2301 11/12/13 0537 11/12/13 1054  HGB 15.2  --  14.2  --   HCT 41.8  --  39.5  --   PLT 226  --  225  --   CREATININE 0.81  --  0.77  --   TROPONINI  --  <0.30 <0.30 <0.30    Estimated Creatinine Clearance: 143.7 ml/min (by C-G formula based on Cr of 0.77).   Medical History: Past Medical History  Diagnosis Date  . Anxiety   . Hypertension   . Diabetes mellitus   . Chronic back pain   . Chronic neck pain   . Coronary artery disease     a. 2000: s/p PCI/stenting of prox LAD and RCA @ [facility];  b. 09/2012 Cath/PCI: LM 30d, LAD 40p ISR, 5722m, 6910m/d (2.25x20 Promus Premier DES), LCX 30p, RCA 80p (3.0x24 Promus Premier DES), patent mid stent, 5922m, PDA 2760m (2.75x28 Promus Premier DES), EF 60-65%.  . Hyperlipidemia   . Tobacco abuse     Medications:  Scheduled:  . aspirin EC  81 mg Oral q morning - 10a  . atorvastatin  40 mg Oral Daily  . busPIRone  15 mg Oral BID  . clonazePAM  0.5 mg Oral BID  . docusate sodium  100 mg Oral BID  . FLUoxetine  20 mg Oral Daily  . heparin  5,000 Units Subcutaneous 3 times per day  . hydrochlorothiazide  25 mg Oral Daily  . insulin aspart  0-9 Units Subcutaneous TID WC  . lisinopril  20 mg Oral Daily  . metoprolol tartrate  25 mg Oral BID  . nicotine  14 mg Transdermal Daily  . nitroGLYCERIN  1 inch Topical 4 times per day  . pantoprazole  40 mg Oral Daily  . sodium chloride  3 mL Intravenous Q12H  . sodium chloride  3 mL Intravenous Q12H    Assessment: 53 yo M with extensive hx CAD  presented with chest pain- cardiac vs GI.  Plan to start heparin until work-up complete.   No bleeding noted.  Currently on SQ heparin for VTE px.  Last dose given at 0625.  Goal of Therapy:  Heparin level 0.3-0.7 units/ml Monitor platelets by anticoagulation protocol: Yes   Plan:  Give 4000 units bolus x 1 Start heparin infusion at 1250 units/hr Check anti-Xa level in 6 hours and daily while on heparin Continue to monitor H&H and platelets  Mercy Ridingndrea Michelle Aanchal Cope 11/12/2013,12:08 PM

## 2013-11-12 NOTE — Evaluation (Signed)
Physical Therapy Evaluation Patient Details Name: Clayton Foster MRN: [id number] DOB: [birth year] Today's Date: 11/12/2013   History of Present Illness  Clayton Foster is a 53 y.o. male with a past medical history of type 2 diabetes mellitus, hypertension, hypercholesterolemia, coronary artery disease with stents placed to RCA and LAD in March of 2014, he has had other stents placed as well in the past, chronic back pain with radiculopathy. Patient [facility]. However, he developed the heart issues requiring stenting and so surgery had to be postponed. He continues to have severe back problems, especially radiating to his left lower extremity. This has been progressively getting worse over the last many months. Patient is frustrated that he has not been able to undergo surgery yet. He presented with complaints of back pain, but has been having chest pain since Monday night. Located in the retrosternal area, was 7-8/10 in intensity. Was a pressure-like sensation, and then also felt like burning sensation. He tried to take medicines for acid reflux but did not provide any relief. He took 2 aspirins. He found his blood pressure to be high at home. He then subsequently decided to come in to the hospital   Clinical Impression  Pt is a 53 yo who has chronic low back pain that radiates to Lt LE. Pt demonstrated limited mobility with a cane to therapist therefore therapist had pt sit on edge of bed while she went to get a walker.  During the time the therapist got a walker which took about 30 seconds the pt had crossed the room to retreive a diet ginger ale and sat back on the bed.  PT then again shows very slow ambulation with walker with therapist.     Follow Up Recommendations No PT follow up    Equipment Recommendations    none- pt states his mom has a rolling walker he can use if he needs to   Recommendations for Other Services  none     Precautions /  Restrictions Precautions Precautions: Fall Restrictions Weight Bearing Restrictions: No      Mobility  Bed mobility:  Pt states he has never been shown log rolling technique.  Pt instructed in log rolling able to come supine to sit with mod I.  Mod I sit to stand.  Ambulated with RW with supervision x 70 feet.         Pertinent Vitals/Pain Leg 7/10    Home Living Family/patient expects to be discharged to:: Private residence Living Arrangements: Parent Available Help at Discharge: Family Type of Home: House Home Access: Stairs to enter   Secretary/administratorntrance Stairs-Number of Steps: 3 Home Layout: One level Home Equipment: Bedside commode;Cane - single point      Prior Function Level of Independence: Independent with assistive device(s)   Gait / Transfers Assistance Needed: Pt reports little ambulation at home due to pain.  ADL's / Homemaking Assistance Needed: Pt is limited in ADL and IADL status due to pain.  On days with increased pain pt does not complete dressing and bathing ADLs.  He also takes sponge baths due to pain with shower transfer.        Hand Dominance   Dominant Hand: Right    Extremity/Trunk Assessment   Upper Extremity Assessment: Overall WFL for tasks assessed (Slight weakness in right shoulder and grip (4+/5))           Lower Extremity Assessment: Generalized weakness  Communication   Communication: No difficulties  Cognition Arousal/Alertness: Awake/alert Behavior During Therapy: WFL for tasks assessed/performed Overall Cognitive Status: Within Functional Limits for tasks assessed                 Exercises Other Exercises Other Exercises: knee to chest; active hamstring stretch, abdominal set , gluteal set and hip adductor set x 10 @      Assessment/Plan    PT Assessment Patient needs continued PT services  PT Diagnosis Difficulty walking   PT Problem List Decreased strength;Decreased activity tolerance;Pain  PT Treatment  Interventions Therapeutic exercise;Gait training   PT Goals (Current goals can be found in the Care Plan section) Acute Rehab PT Goals Patient Stated Goal: to decrease pain PT Goal Formulation: With patient Time For Goal Achievement: 11/14/13 Potential to Achieve Goals: Good    Frequency Min 3X/week           End of Session     Patient left: in chair;with call bell/phone within reach           Time: 1130-1202 PT Time Calculation (min): 32 min   Charges:   PT Evaluation $Initial PT Evaluation Tier I: 1 Procedure          Bella KennedyCynthia J Layden Caterino 11/12/2013, 12:06 PM

## 2013-11-12 NOTE — Progress Notes (Addendum)
ANTICOAGULATION CONSULT NOTE  Pharmacy Consult for Heparin Indication: chest pain/ACS  No Known Allergies  Patient Measurements: Height: 6\' 3"  (190.5 cm) Weight: 233 lb 1.6 oz (105.733 kg) IBW/kg (Calculated) : 84.5 Heparin Dosing Weight: 105 kg  Vital Signs: Temp: 97.3 F (36.3 C) (04/22 1518) Temp src: Oral (04/22 1518) BP: 110/61 mmHg (04/22 1518) Pulse Rate: 77 (04/22 1642)  Labs:  Recent Labs  11/11/13 2031  11/12/13 0537 11/12/13 1054 11/12/13 1703 11/12/13 1842  HGB 15.2  --  14.2  --   --   --   HCT 41.8  --  39.5  --   --   --   PLT 226  --  225  --   --   --   HEPARINUNFRC  --   --   --   --   --  0.10*  CREATININE 0.81  --  0.77  --   --   --   TROPONINI  --   < > <0.30 <0.30 <0.30  --   < > = values in this interval not displayed.  Estimated Creatinine Clearance: 143.7 ml/min (by C-G formula based on Cr of 0.77).   Medical History: Past Medical History  Diagnosis Date  . Anxiety   . Hypertension   . Diabetes mellitus   . Chronic back pain   . Chronic neck pain   . Coronary artery disease     a. 2000: s/p PCI/stenting of prox LAD and RCA @ [facility];  b. 09/2012 Cath/PCI: LM 30d, LAD 40p ISR, 872m, 5557m/d (2.25x20 Promus Premier DES), LCX 30p, RCA 80p (3.0x24 Promus Premier DES), patent mid stent, 312m, PDA 5210m (2.75x28 Promus Premier DES), EF 60-65%.  . Hyperlipidemia   . Tobacco abuse     Medications:  Scheduled:  . aspirin EC  81 mg Oral q morning - 10a  . atorvastatin  40 mg Oral Daily  . busPIRone  15 mg Oral BID  . clonazePAM  0.5 mg Oral BID  . docusate sodium  100 mg Oral BID  . FLUoxetine  20 mg Oral Daily  . hydrochlorothiazide  25 mg Oral Daily  . insulin aspart  0-15 Units Subcutaneous TID WC  . insulin aspart  4 Units Subcutaneous TID WC  . insulin detemir  5 Units Subcutaneous QHS  . lisinopril  20 mg Oral Daily  . metoprolol tartrate  25 mg Oral BID  . nicotine  14 mg Transdermal Daily  . nitroGLYCERIN  1 inch Topical 4 times per  day  . pantoprazole  40 mg Oral Daily  . sodium chloride  3 mL Intravenous Q12H  . sodium chloride  3 mL Intravenous Q12H    Assessment: 53 yo M with extensive hx CAD presented with chest pain- cardiac vs GI.  Plan to start heparin until work-up complete.   No bleeding noted.   Current eparin level below goal.  Goal of Therapy:  Heparin level 0.3-0.7 units/ml Monitor platelets by anticoagulation protocol: Yes   Plan:  Additional load of 2000 units Increase Heparin to 1500 units/hr Repeat AM labs in 6-8 hours.  Clayton GemmaMichael R Foster Clayton Foster 11/12/2013,7:43 PM

## 2013-11-12 NOTE — Consult Note (Signed)
Reason for Consult: Chest pain Referring Physician: PTH  Clayton Foster is an 53 y.o. male.  HPI: This is a 53 year old with extensive CAD that was admitted with severe low back pain as well as chest pain. He has history of stenting to the RCA and LAD in 2000 at [facility]. In March 2014 the patient underwent successful PCI and drug-eluting stent to the mid LAD as well as the proximal and distal RCA by Dr. Burt Knack. EF was 60-65%. It was felt he would require a minimum of dual antiplatelet therapy for 6 months ideally for 1 year. If he is required to undergo back surgery it would be reasonable to come off at 6 months considering he has second generation drug-eluting stents in place. He also has hypertension, anxiety, diabetes mellitus, tobacco abuse, and hyperlipidemia. Troponins are negative, potassium high 5.9,  The patient states that he grunts a lot when he has back pain and feels like he's strained his chest muscles. He also ate a piece of meat last night that went down the wrong way and he developed a severe burning in his lower chest similar to pain that he had when he came in. He says it's more severe than his cardiac pain, but he has difficulty distinguishing between the two. This is the first chest pain he's had since his stents. He does smoke about 15 cigarettes daily.   His main complaint is severe back pain and he's been going to the pain clinic here. His mother is ill and he doesn't have a drivers license so has been unable to go back to [place]. EKG ? New TWI inferiorly.  Past Medical History  Diagnosis Date  . Anxiety   . Hypertension   . Diabetes mellitus   . Chronic back pain   . Chronic neck pain   . Coronary artery disease     a. 2000: s/p PCI/stenting of prox LAD and RCA @ [facility];  b. 09/2012 Cath/PCI: LM 30d, LAD 40p ISR, 23m 957m (2.25x20 Promus Premier DES), LCX 30p, RCA 80p (3.0x24 Promus Premier DES), patent mid stent, 4081mDA [id number] Promus Premier  DES), EF 60-65%.  . Hyperlipidemia   . Tobacco abuse     Past Surgical History  Procedure Laterality Date  . Back surgery    . Appendectomy    . Tonsillectomy    . Cardiac stents      Family History  Problem Relation Age of Onset  . Hypertension Father     Social History:  reports that he has been smoking Cigarettes.  He has been smoking about 0.25 packs per day. He does not have any smokeless tobacco history on file. He reports that he does not drink alcohol or use illicit drugs.  Allergies: No Known Allergies  Medications: Scheduled Meds: . aspirin EC  81 mg Oral q morning - 10a  . atorvastatin  40 mg Oral Daily  . busPIRone  15 mg Oral BID  . clonazePAM  0.5 mg Oral BID  . docusate sodium  100 mg Oral BID  . FLUoxetine  20 mg Oral Daily  . heparin  5,000 Units Subcutaneous 3 times per day  . hydrochlorothiazide  25 mg Oral Daily  . insulin aspart  0-9 Units Subcutaneous TID WC  . lisinopril  20 mg Oral Daily  . metoprolol tartrate  25 mg Oral BID  . nicotine  14 mg Transdermal Daily  . nitroGLYCERIN  1 inch Topical 4 times per day  .  pantoprazole  40 mg Oral Daily  . sodium chloride  3 mL Intravenous Q12H  . sodium chloride  3 mL Intravenous Q12H   Continuous Infusions:  PRN Meds:.sodium chloride, acetaminophen, acetaminophen, albuterol, cyclobenzaprine, morphine injection, ondansetron (ZOFRAN) IV, ondansetron, oxyCODONE-acetaminophen, sodium chloride   Results for orders placed during the hospital encounter of 11/11/13 (from the past 48 hour(s))  TROPONIN I     Status: None   Collection Time    11/11/13  7:49 PM      Result Value Ref Range   Troponin I <0.30  <0.30 ng/mL   Comment:            Due to the release kinetics of cTnI,     a negative result within the first hours     of the onset of symptoms does not rule out     myocardial infarction with certainty.     If myocardial infarction is still suspected,     repeat the test at appropriate intervals.   CBC WITH DIFFERENTIAL     Status: Abnormal   Collection Time    11/11/13  8:31 PM      Result Value Ref Range   WBC 9.9  4.0 - 10.5 K/uL   RBC 4.63  4.22 - 5.81 MIL/uL   Hemoglobin 15.2  13.0 - 17.0 g/dL   HCT 41.8  39.0 - 52.0 %   MCV 90.3  78.0 - 100.0 fL   MCH 32.8  26.0 - 34.0 pg   MCHC 36.4 (*) 30.0 - 36.0 g/dL   RDW 12.5  11.5 - 15.5 %   Platelets 226  150 - 400 K/uL   Neutrophils Relative % 44  43 - 77 %   Neutro Abs 4.4  1.7 - 7.7 K/uL   Lymphocytes Relative 43  12 - 46 %   Lymphs Abs 4.2 (*) 0.7 - 4.0 K/uL   Monocytes Relative 9  3 - 12 %   Monocytes Absolute 0.9  0.1 - 1.0 K/uL   Eosinophils Relative 4  0 - 5 %   Eosinophils Absolute 0.4  0.0 - 0.7 K/uL   Basophils Relative 0  0 - 1 %   Basophils Absolute 0.0  0.0 - 0.1 K/uL  BASIC METABOLIC PANEL     Status: Abnormal   Collection Time    11/11/13  8:31 PM      Result Value Ref Range   Sodium 135 (*) 137 - 147 mEq/L   Potassium 3.6 (*) 3.7 - 5.3 mEq/L   Chloride 91 (*) 96 - 112 mEq/L   CO2 29  19 - 32 mEq/L   Glucose, Bld 213 (*) 70 - 99 mg/dL   BUN 9  6 - 23 mg/dL   Creatinine, Ser 0.81  0.50 - 1.35 mg/dL   Calcium 9.9  8.4 - 10.5 mg/dL   GFR calc non Af Amer >90  >90 mL/min   GFR calc Af Amer >90  >90 mL/min   Comment: (NOTE)     The eGFR has been calculated using the CKD EPI equation.     This calculation has not been validated in all clinical situations.     eGFR's persistently <90 mL/min signify possible Chronic Kidney     Disease.  TROPONIN I     Status: None   Collection Time    11/11/13 11:01 PM      Result Value Ref Range   Troponin I <0.30  <0.30 ng/mL   Comment:  Due to the release kinetics of cTnI,     a negative result within the first hours     of the onset of symptoms does not rule out     myocardial infarction with certainty.     If myocardial infarction is still suspected,     repeat the test at appropriate intervals.  TROPONIN I     Status: None   Collection Time     11/12/13  5:37 AM      Result Value Ref Range   Troponin I <0.30  <0.30 ng/mL   Comment:            Due to the release kinetics of cTnI,     a negative result within the first hours     of the onset of symptoms does not rule out     myocardial infarction with certainty.     If myocardial infarction is still suspected,     repeat the test at appropriate intervals.  COMPREHENSIVE METABOLIC PANEL     Status: Abnormal   Collection Time    11/12/13  5:37 AM      Result Value Ref Range   Sodium 135 (*) 137 - 147 mEq/L   Potassium 5.9 (*) 3.7 - 5.3 mEq/L   Comment: DELTA CHECK NOTED   Chloride 98  96 - 112 mEq/L   CO2 24  19 - 32 mEq/L   Glucose, Bld 367 (*) 70 - 99 mg/dL   BUN 12  6 - 23 mg/dL   Creatinine, Ser 0.77  0.50 - 1.35 mg/dL   Calcium 9.5  8.4 - 10.5 mg/dL   Total Protein 7.3  6.0 - 8.3 g/dL   Albumin 3.7  3.5 - 5.2 g/dL   AST 44 (*) 0 - 37 U/L   ALT 54 (*) 0 - 53 U/L   Alkaline Phosphatase 89  39 - 117 U/L   Total Bilirubin 1.0  0.3 - 1.2 mg/dL   GFR calc non Af Amer >90  >90 mL/min   GFR calc Af Amer >90  >90 mL/min   Comment: (NOTE)     The eGFR has been calculated using the CKD EPI equation.     This calculation has not been validated in all clinical situations.     eGFR's persistently <90 mL/min signify possible Chronic Kidney     Disease.  CBC     Status: None   Collection Time    11/12/13  5:37 AM      Result Value Ref Range   WBC 9.2  4.0 - 10.5 K/uL   RBC 4.36  4.22 - 5.81 MIL/uL   Hemoglobin 14.2  13.0 - 17.0 g/dL   HCT 39.5  39.0 - 52.0 %   MCV 90.6  78.0 - 100.0 fL   MCH 32.6  26.0 - 34.0 pg   MCHC 35.9  30.0 - 36.0 g/dL   RDW 12.5  11.5 - 15.5 %   Platelets 225  150 - 400 K/uL  GLUCOSE, CAPILLARY     Status: Abnormal   Collection Time    11/12/13  7:25 AM      Result Value Ref Range   Glucose-Capillary 300 (*) 70 - 99 mg/dL   Comment 1 Documented in Chart     Comment 2 Notify RN      Dg Sacrum/coccyx  11/11/2013   CLINICAL DATA:  fell in  sitting position  EXAM: SACRUM AND COCCYX - 2+ VIEW  COMPARISON:  None.  FINDINGS: There is no evidence of fracture or other focal bone lesions. Degenerative disc disease changes are appreciated at L5-S1.  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 No acute osseous abnormalities.   Electronically Signed   By: Margaree Mackintosh M.D.   On: 11/11/2013 20:48    ROS See HPI Eyes: Negative Ears:Negative for hearing loss, tinnitus Cardiovascular: Negative for  palpitations,irregular heartbeat, dyspnea, dyspnea on exertion, near-syncope, orthopnea, paroxysmal nocturnal dyspnea and syncope,edema, claudication, cyanosis,.  Respiratory:   Negative for cough, hemoptysis, shortness of breath, sleep disturbances due to breathing, sputum production and wheezing.   Endocrine: Negative for cold intolerance and heat intolerance.  Hematologic/Lymphatic: Negative for adenopathy and bleeding problem. Does not bruise/bleed easily.  Musculoskeletal: Negative.   Gastrointestinal:indigestion Negative for nausea, vomiting, , abdominal pain, diarrhea, constipation.   Genitourinary: Negative for bladder incontinence, dysuria, flank pain, frequency, hematuria, hesitancy, nocturia and urgency.  Neurological: severe back pain and radiculopathy.  Allergic/Immunologic: Negative for environmental allergies.  Blood pressure 135/93, pulse 84, temperature 97.4 F (36.3 C), temperature source Oral, resp. rate 20, height '6\' 3"'  (1.905 m), weight 233 lb 1.6 oz (105.733 kg), SpO2 97.00%. Physical Exam PHYSICAL EXAM: Well-nournished, in no acute distress. Neck: No JVD, HJR, Bruit, or thyroid enlargement Lungs: No tachypnea, clear without wheezing, rales, or rhonchi Cardiovascular: RRR, PMI not displaced, heart sounds distant, no murmurs, gallops, bruit, thrill, or heave. Abdomen: BS normal. Soft without organomegaly, masses, lesions or tenderness. Extremities: without cyanosis, clubbing or edema. Good distal  pulses bilateral SKin: Warm, no lesions or rashes  Musculoskeletal: No deformities Neuro: no focal signs  EKG normal sinus rhythm with incomplete right bundle branch block and T wave inversion inferiorly which is new  Assessment/Plan: Recurrent chest pain: Troponins negative, not convinced it's cardiac. Could be musculoskeletal or GI. Continue to monitor.Not sure stress test needed at this time. On nitro paste. Could add Imdur.  CAD status post stenting of the RCA and LAD at [facility] in 2000, March 2014 PCI and drug-eluting stent to the mid LAD as well as the proximal and distal RCA by Dr. Burt Knack. EF was 60-65%. Plan dual antiplatelet therapy for at least 6 months preferably one year prior to undergoing back surgery.  Severe back pain with radiculopathy  Hypertension  Hyperlipidemia  Diabetes mellitus type 2  Panic disorder  Ongoing Tobacco abuse: smoking cessation discussed.  Imogene Burn 11/12/2013, 8:14 AM     Attending Note Patient seen and discussed with PA Lenze. I agree with her documentation above. 53 yo male hx of HTN, DM, CAD with prior stenting. Last cath in setting of unstable angina 09/2012 LM 30% distal, LAD prox stent with 40% ISR, LAD mid 40% with more distal 95% lesion, LCX 30% prox, RCA prox 80%, patent RCA mid stent, with 40% mid RCA disease, PDA 80% mid. LV gram 60-65%. DES to prox RCA, DES to distal RCA, and DES to mid LAD lesions at that time. Prior to stents he was evaluated for CT surgery and it was thought not to be a good option for him due to his poor functional capacity from chronic back pain, and thus multivessel PCI was performed. Due to this procedure planned back surgery at [facility] was postponed. He was on DAPT until November 2014 (total of 8 months) when he stopped it on his own because he felt plavix was causing stomach upset.   Admitted with chest pain. Started Monday night while at rest, 4/10 chest pain mid chest with associated SOB that  lasted approx  10-15 minutes. Feels like somewhat better with positioning. Repeat episodes on and off throughout Tuesday similar in character. Reports he has not had chest pain previously over the last several months. No strict association with food, those states had episode last night after eating.   EKG sinus tach 104, LAD, LAE, ST depressions V5/V6 in initial EKG noted noted in follow up EKGs. Also non-specific inferior T-wave changes. Troponin negative x 3. No chest xray available, will order.   Unclear etiology of his chest pain at this time. He has known CAD history with stents 09/2012, where he only completed 8 months of DAPT after self discontinuing. Negative troponins and non-specific EKG changes. If true unstable angina patient's TIMI score is >3 indicating higher risk, will initiate anticoag as we pursue further testing. Will obtain echo and Lexiscan to further evaluation and risk stratify.   Carlyle Dolly MD

## 2013-11-12 NOTE — ED Notes (Signed)
Attempted to call report, nurse at lunch, will call when she is done.

## 2013-11-12 NOTE — Progress Notes (Signed)
Nutrition Brief Note  Patient identified on the Malnutrition Screening Tool (MST) Report  Wt Readings from Last 15 Encounters:  11/12/13 233 lb 1.6 oz (105.733 kg)  06/06/13 245 lb (111.131 kg)  02/21/13 241 lb 12 oz (109.657 kg)  11/01/12 250 lb (113.399 kg)  10/22/12 250 lb (113.399 kg)  10/15/12 247 lb 12.8 oz (112.4 kg)  10/15/12 247 lb 12.8 oz (112.4 kg)  10/15/12 247 lb 12.8 oz (112.4 kg)  10/15/12 247 lb 12.8 oz (112.4 kg)  07/22/12 244 lb 7.8 oz (110.9 kg)  04/09/12 252 lb (114.306 kg)  02/20/12 245 lb (111.131 kg)  02/16/12 245 lb (111.131 kg)  12/31/11 245 lb (111.131 kg)  09/28/11 250 lb (113.399 kg)   Pt admitted for chest pain. UBW: 245#. Wt loss of 6.8% x 1 year and 4.9% x 6 months not clinically significant. Good appetite.  Body mass index is 29.14 kg/(m^2). Patient meets criteria for overweight based on current BMI.   Current diet order is carb modified, patient is consuming approximately 100% of meals at this time. Labs and medications reviewed.   No nutrition interventions warranted at this time. If nutrition issues arise, please consult RD.   Aften Lipsey A. Mayford KnifeWilliams, RD, LDN Pager: [phone]

## 2013-11-12 NOTE — H&P (Signed)
[facility] History and Physical  Clayton Foster PPJ:[id number] DOB: [birth year] DOA: 11/11/2013   PCP: Raiford Simmonds., PA-C  Specialists: Followed by cardiology  Chief Complaint: Chest pain, and back pain  HPI: Clayton Foster is a 53 y.o. male with a past medical history of type 2 diabetes mellitus, hypertension, hypercholesterolemia, coronary artery disease with stents placed to RCA and LAD in March of 2014, he has had other stents placed as well in the past, chronic back pain with radiculopathy. Patient was supposed to undergo back surgery last year at [facility]. However, he developed the heart issues requiring stenting and so surgery had to be postponed. He continues to have severe back problems, especially radiating to his left lower extremity. This has been progressively getting worse over the last many months. Patient is frustrated that he has not been able to undergo surgery yet. He presented with complaints of back pain, but has been having chest pain since Monday night. Located in the retrosternal area, was 7-8/10 in intensity. Was a pressure-like sensation, and then also felt like burning sensation. He tried to take medicines for acid reflux but did not provide any relief. He took 2 aspirins. He found his blood pressure to be high at home. He then subsequently decided to come in to the hospital tonight. No nausea, vomiting, no dizziness or lightheadedness. No shortness of breath. Currently, the pain is 4/10 in intensity. Denies any radiation of this chest pain. Denies any leg swelling.  Home Medications: Prior to Admission medications   Medication Sig Start Date End Date Taking? Authorizing Provider  acetaminophen (TYLENOL) 500 MG tablet Take 1,000 mg by mouth daily as needed for pain.    Yes Historical Provider, MD  aspirin EC 81 MG tablet Take 81 mg by mouth every morning.    Yes Historical Provider, MD  atorvastatin (LIPITOR) 40 MG tablet Take 40 mg by mouth daily.   Yes  Historical Provider, MD  busPIRone (BUSPAR) 15 MG tablet Take 15 mg by mouth 2 (two) times daily.   Yes Historical Provider, MD  clonazePAM (KLONOPIN) 0.5 MG tablet Take 0.5 mg by mouth 2 (two) times daily.   Yes Historical Provider, MD  cyclobenzaprine (FLEXERIL) 10 MG tablet Take 10 mg by mouth 3 (three) times daily as needed. Muscle Spasms   Yes Historical Provider, MD  FLUoxetine (PROZAC) 20 MG capsule Take 20 mg by mouth daily.   Yes Historical Provider, MD  metFORMIN (GLUCOPHAGE) 1000 MG tablet Take 1 tablet (1,000 mg total) by mouth 2 (two) times daily with a meal. 10/15/12  Yes Rogelia Mire, NP  metoprolol tartrate (LOPRESSOR) 25 MG tablet Take 1 tablet (25 mg total) by mouth 2 (two) times daily. 10/13/13  Yes Lendon Colonel, NP  oxyCODONE-acetaminophen (PERCOCET/ROXICET) 5-325 MG per tablet Take 1 tablet by mouth every 4 (four) hours as needed for pain. 11/01/12  Yes Tammy L. Triplett, PA-C  pantoprazole (PROTONIX) 40 MG tablet Take 40 mg by mouth daily.   Yes Historical Provider, MD  quinapril-hydrochlorothiazide (ACCURETIC) 20-25 MG per tablet Take 1 tablet by mouth every morning. 09/28/11  Yes Elige Radon, MD  nitroGLYCERIN (NITROSTAT) 0.4 MG SL tablet Place 1 tablet (0.4 mg total) under the tongue every 5 (five) minutes as needed for chest pain. 07/23/12   Kathie Dike, MD    Allergies: No Known Allergies  Past Medical History: Past Medical History  Diagnosis Date  . Anxiety   . Hypertension   . Diabetes mellitus   .  Chronic back pain   . Chronic neck pain   . Coronary artery disease     a. 2000: s/p PCI/stenting of prox LAD and RCA @ [facility];  b. 09/2012 Cath/PCI: LM 30d, LAD 40p ISR, 25m 975m (2.25x20 Promus Premier DES), LCX 30p, RCA 80p (3.0x24 Promus Premier DES), patent mid stent, 4097mDA 25m37m75x28 Promus Premier DES), EF 60-65%.  . Hyperlipidemia   . Tobacco abuse     Past Surgical History  Procedure Laterality Date  . Back surgery    . Appendectomy      . Tonsillectomy    . Cardiac stents      Social History: He lives with his mother. Smokes half pack of cigarettes on a daily basis. No alcohol use. No illicit drug use. Uses a cane to ambulate, but his mobility is restricted because of his chronic back pain.  Family History:  Family History  Problem Relation Age of Onset  . Hypertension Father      Review of Systems - History obtained from the patient General ROS: positive for  - fatigue Psychological ROS: positive for - anxiety Ophthalmic ROS: negative ENT ROS: negative Allergy and Immunology ROS: negative Hematological and Lymphatic ROS: negative Endocrine ROS: negative Respiratory ROS: no cough, shortness of breath, or wheezing Cardiovascular ROS: as in hpi Gastrointestinal ROS: no abdominal pain, change in bowel habits, or black or bloody stools Genito-Urinary ROS: no dysuria, trouble voiding, or hematuria Musculoskeletal ROS: chronic back pain Neurological ROS: no TIA or stroke symptoms Dermatological ROS: negative  Physical Examination  Filed Vitals:   11/11/13 2230 11/11/13 2232 11/11/13 2325 11/12/13 0008  BP: 110/74 109/70 116/74   Pulse: 73 52 83 82  Temp:    97.9 F (36.6 C)  TempSrc:    Oral  Resp: '12 16 19 20  ' Height:      Weight:      SpO2: 96% 97% 96% 97%    BP 116/74  Pulse 82  Temp(Src) 97.9 F (36.6 C) (Oral)  Resp 20  Ht '6\' 3"'  (1.905 m)  Wt 107.191 kg (236 lb 5 oz)  BMI 29.54 kg/m2  SpO2 97%  General appearance: alert, cooperative, appears stated age and no distress Head: Normocephalic, without obvious abnormality, atraumatic Eyes: conjunctivae/corneas clear. PERRL, EOM's intact.  Throat: lips, mucosa, and tongue normal; teeth and gums normal Neck: no adenopathy, no carotid bruit, no JVD, supple, symmetrical, trachea midline and thyroid not enlarged, symmetric, no tenderness/mass/nodules Resp: clear to auscultation bilaterally Cardio: regular rate and rhythm, S1, S2 normal, no murmur,  click, rub or gallop GI: soft, non-tender; bowel sounds normal; no masses,  no organomegaly Extremities: extremities normal, atraumatic, no cyanosis or edema.  Pulses: 2+ and symmetric Skin: Skin color, texture, turgor normal. No rashes or lesions Neurologic: Alert and oriented x3. Cranial nerves are intact. Motor strength is equal in bilateral upper extremities. Also equal in lower extremities, but weak compared to the upper extremities. This is likely due to his lumbar disease.  Laboratory Data: Results for orders placed during the hospital encounter of 11/11/13 (from the past 48 hour(s))  TROPONIN I     Status: None   Collection Time    11/11/13  7:49 PM      Result Value Ref Range   Troponin I <0.30  <0.30 ng/mL   Comment:            Due to the release kinetics of cTnI,     a negative result within the first hours  of the onset of symptoms does not rule out     myocardial infarction with certainty.     If myocardial infarction is still suspected,     repeat the test at appropriate intervals.  CBC WITH DIFFERENTIAL     Status: Abnormal   Collection Time    11/11/13  8:31 PM      Result Value Ref Range   WBC 9.9  4.0 - 10.5 K/uL   RBC 4.63  4.22 - 5.81 MIL/uL   Hemoglobin 15.2  13.0 - 17.0 g/dL   HCT 41.8  39.0 - 52.0 %   MCV 90.3  78.0 - 100.0 fL   MCH 32.8  26.0 - 34.0 pg   MCHC 36.4 (*) 30.0 - 36.0 g/dL   RDW 12.5  11.5 - 15.5 %   Platelets 226  150 - 400 K/uL   Neutrophils Relative % 44  43 - 77 %   Neutro Abs 4.4  1.7 - 7.7 K/uL   Lymphocytes Relative 43  12 - 46 %   Lymphs Abs 4.2 (*) 0.7 - 4.0 K/uL   Monocytes Relative 9  3 - 12 %   Monocytes Absolute 0.9  0.1 - 1.0 K/uL   Eosinophils Relative 4  0 - 5 %   Eosinophils Absolute 0.4  0.0 - 0.7 K/uL   Basophils Relative 0  0 - 1 %   Basophils Absolute 0.0  0.0 - 0.1 K/uL  BASIC METABOLIC PANEL     Status: Abnormal   Collection Time    11/11/13  8:31 PM      Result Value Ref Range   Sodium 135 (*) 137 - 147  mEq/L   Potassium 3.6 (*) 3.7 - 5.3 mEq/L   Chloride 91 (*) 96 - 112 mEq/L   CO2 29  19 - 32 mEq/L   Glucose, Bld 213 (*) 70 - 99 mg/dL   BUN 9  6 - 23 mg/dL   Creatinine, Ser 0.81  0.50 - 1.35 mg/dL   Calcium 9.9  8.4 - 10.5 mg/dL   GFR calc non Af Amer >90  >90 mL/min   GFR calc Af Amer >90  >90 mL/min   Comment: (NOTE)     The eGFR has been calculated using the CKD EPI equation.     This calculation has not been validated in all clinical situations.     eGFR's persistently <90 mL/min signify possible Chronic Kidney     Disease.  TROPONIN I     Status: None   Collection Time    11/11/13 11:01 PM      Result Value Ref Range   Troponin I <0.30  <0.30 ng/mL   Comment:            Due to the release kinetics of cTnI,     a negative result within the first hours     of the onset of symptoms does not rule out     myocardial infarction with certainty.     If myocardial infarction is still suspected,     repeat the test at appropriate intervals.    Radiology Reports: Dg Sacrum/coccyx  11/11/2013   CLINICAL DATA:  fell in sitting position  EXAM: SACRUM AND COCCYX - 2+ VIEW  COMPARISON:  None.  FINDINGS: There is no evidence of fracture or other focal bone lesions. Degenerative disc disease changes are appreciated at L5-S1.  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 No acute osseous abnormalities.   Electronically  Signed   By: Margaree Mackintosh M.D.   On: 11/11/2013 20:48    Electrocardiogram: 2 EKGs are available. The first one was done at 6:20 PM on April 21. This shows sinus tachycardia at 104 beats per minute. Left axis deviation.. Intervals are normal. No definite Q waves. No concerning ST changes. T inversion in lead 3.  Second EKG was done at 10:50 PM, which shows a normal sinus rhythm at 70 beats per minute. left axis deviation. No Q waves. No concerning ST changes. T inversion noted in lead 3 which is noted in previous EKG as well.  Problem List  Principal  Problem:   Chest pain Active Problems:   Diabetes mellitus, type 2   Hypertension   High cholesterol   Back pain with radiculopathy   CAD (coronary artery disease)   Tobacco use   Assessment: This is a 53 year old, Caucasian male, who presents with chest pain, and acute worsening of his back pain. He does have significant history of coronary artery disease. His EKG does not show any concrete ischemic changes. However, due to his history he merits hospitalization. He's also experiencing worsening in his back pain without any new neurological deficits.  Plan: #1 chest pain in the setting of known coronary artery disease: He'll be monitored on telemetry. Troponins will be trended. Cardiology will be consulted in the morning. No clear indication for heparin infusion at this time. NTG Paste will be utilized. Smoking cessation counseling was provided. Nicotine patch will be prescribed. Continue with his antiplatelet agents and his beta blocker. He was prescribed Plavix last year after his stent placement and was asked to take at least for 6 months. He stopped taking it in November 2014.  #2 acute on chronic back pain with left-sided radiculopathy: This is unfortunately, a chronic issue for him for the most part. We will have PT and OT see him. Provide him pain medications. He is supposed to follow up with his back surgeon in [place]. However till he is cleared from a cardiac perspective, he cannot undergo this surgery. He was given a dose of steroid in the ED. MRI report from 2014 was reviewed. No concerning neurological deficits identified.  #3 history of hypertension: Monitor blood pressure closely.  #4 diabetes mellitus, type II: Place him on sliding scale coverage. Check HbA1c.  #5 hypokalemia: This will be repleted. Recheck in the morning   DVT Prophylaxis: Heparin Code Status: Full code Family Communication: Discussed with the patient  Disposition Plan: Admit to telemetry   Further  management decisions will depend on results of further testing and patient's response to treatment.   Bonnielee Haff  [facility] Pager [phone]  If 7PM-7AM, please contact night-coverage [email or web address] Password [facility]  11/12/2013, 12:28 AM

## 2013-11-12 NOTE — Evaluation (Signed)
Occupational Therapy Evaluation Patient Details Name: Clayton LevyRichard W Sayavong MRN: [id number] DOB: 1961-04-29 Today's Date: 11/12/2013    History of Present Illness Clayton Foster is a 53 y.o. male with a past medical history of type 2 diabetes mellitus, hypertension, hypercholesterolemia, coronary artery disease with stents placed to RCA and LAD in March of 2014, he has had other stents placed as well in the past, chronic back pain with radiculopathy. [facility]. However, he developed the heart issues requiring stenting and so surgery had to be postponed. He continues to have severe back problems, especially radiating to his left lower extremity. This has been progressively getting worse over the last many months. Patient is frustrated that he has not been able to undergo surgery yet. He presented with complaints of back pain, but has been having chest pain since Monday night. Located in the retrosternal area, was 7-8/10 in intensity. Was a pressure-like sensation, and then also felt like burning sensation. He tried to take medicines for acid reflux but did not provide any relief. He took 2 aspirins. He found his blood pressure to be high at home. He then subsequently decided to come in to the hospital    Clinical Impression   Pt is presenting to acute OT with above situation.  He is physically able to complete his ADL tasks, but due to increased pain is at times unable and has increased difficulty. Pt will benefit from further OT services for further education on adaptive strategies for completing ADLand IADL tasks in the home. Initiated education on lower body dressing techniques this session. Recommend HHOT.    Follow Up Recommendations  Home health OT (HHOT for AE education in the home and increase in ADL status.)    [facility] Recommendations   [facility]    Recommendations for Other Services       Precautions / Restrictions  Precautions Precautions: Fall Restrictions Weight Bearing Restrictions: No      Mobility Bed Mobility                Transfers                    Balance                                            ADL Overall ADL's : Needs assistance/impaired Eating/Feeding: Modified independent           Lower Body Bathing: Modified independent       Lower Body Dressing: Modified independent                 General ADL Comments: Pt is able to complete ADL tasks, but has increased difficulty on days with increased pain. Bending to complete LU ADLs is partcularly difficult.  Pt is currenlty only taking sponge baths due to increased pain and on days of increased pain does not change his clothes.     Vision                     Perception     Praxis      Pertinent Vitals/Pain Pt at 7/10 pain in LLE.     Hand Dominance Right   Extremity/Trunk Assessment Upper Extremity Assessment Upper Extremity Assessment: Overall WFL for tasks assessed (Slight weakness in right shoulder and grip (  4+/5))   Lower Extremity Assessment Lower Extremity Assessment: Generalized weakness       Communication Communication Communication: No difficulties   Cognition Arousal/Alertness: Awake/alert Behavior During Therapy: WFL for tasks assessed/performed Overall Cognitive Status: Within Functional Limits for tasks assessed                     General Comments       Exercises     Shoulder Instructions      Home Living Family/patient expects to be discharged to:: Private residence Living Arrangements: Parent Available Help at Discharge: Family Type of Home: House Home Access: Stairs to enter Secretary/administratorntrance Stairs-Number of Steps: 3   Home Layout: One level     Bathroom Shower/Tub: Chief Strategy OfficerTub/shower unit   Bathroom Toilet: Handicapped height     Home Equipment: Bedside commode;Cane - single point          Prior Functioning/Environment  Level of Independence: Independent with assistive device(s)  Gait / Transfers Assistance Needed: Pt reports little ambulation at home due to pain. ADL's / Homemaking Assistance Needed: Pt is limited in ADL and IADL status due to pain.  On days with increased pain pt does not complete dressing and bathing ADLs.  He also takes sponge baths due to pain with shower transfer.  Pt's mother is currently completing all IADL tasks.        OT Diagnosis: Acute pain   OT Problem List: Decreased knowledge of use of DME or AE;Pain   OT Treatment/Interventions: Self-care/ADL training;Patient/family education;DME and/or AE instruction    OT Goals(Current goals can be found in the care plan section) Acute Rehab OT Goals Patient Stated Goal: to decrease pain OT Goal Formulation: With patient Time For Goal Achievement: 11/26/13 Potential to Achieve Goals: Good ADL Goals Pt Will Perform Lower Body Dressing: with modified independence;with adaptive equipment Additional ADL Goal #1: Will be educated on the use of AE for ADL needs  OT Frequency: Min 1X/week   Barriers to D/C: Decreased caregiver support (Per pt report, pt's mother may be experiencing a cognitive decline.)          Co-evaluation              End of Session    Activity Tolerance: Patient tolerated treatment well Patient left: in bed;with call bell/phone within reach   Time: 0839-0920 OT Time Calculation (min): 41 min Charges:  OT Evaluation $Initial OT Evaluation Tier I: 1 Procedure $OT Re-eval: 1 Procedure OT Treatments $Self Care/Home Management : 8-22 mins G-Codes:     Marry GuanMarie Rawlings, MS, OTR/L [phone] 11/12/2013, 12:04 PM

## 2013-11-13 ENCOUNTER — Inpatient Hospital Stay (HOSPITAL_COMMUNITY): Payer: Medicaid Other

## 2013-11-13 ENCOUNTER — Encounter (HOSPITAL_COMMUNITY): Payer: Self-pay

## 2013-11-13 DIAGNOSIS — R079 Chest pain, unspecified: Secondary | ICD-10-CM

## 2013-11-13 LAB — BASIC METABOLIC PANEL
BUN: 15 mg/dL (ref 6–23)
CALCIUM: 9.6 mg/dL (ref 8.4–10.5)
CO2: 28 mEq/L (ref 19–32)
CREATININE: 0.82 mg/dL (ref 0.50–1.35)
Chloride: 102 mEq/L (ref 96–112)
GFR calc Af Amer: >90 mL/min (ref >90)
Glucose, Bld: 225 mg/dL — ABNORMAL HIGH (ref 70–99)
Potassium: 4.1 mEq/L (ref 3.7–5.3)
SODIUM: 143 meq/L (ref 137–147)

## 2013-11-13 LAB — CBC
HCT: 37.9 % — ABNORMAL LOW (ref 39.0–52.0)
Hemoglobin: 13.5 g/dL (ref 13.0–17.0)
MCH: 32.7 pg (ref 26.0–34.0)
MCHC: 35.6 g/dL (ref 30.0–36.0)
MCV: 91.8 fL (ref 78.0–100.0)
PLATELETS: 209 10*3/uL (ref 150–400)
RBC: 4.13 MIL/uL — AB (ref 4.22–5.81)
RDW: 12.6 % (ref 11.5–15.5)
WBC: 11.5 10*3/uL — AB (ref 4.0–10.5)

## 2013-11-13 LAB — GLUCOSE, CAPILLARY
Glucose-Capillary: 228 mg/dL — ABNORMAL HIGH (ref 70–99)
Glucose-Capillary: 264 mg/dL — ABNORMAL HIGH (ref 70–99)

## 2013-11-13 LAB — HEPARIN LEVEL (UNFRACTIONATED): HEPARIN UNFRACTIONATED: 0.38 [IU]/mL (ref 0.30–0.70)

## 2013-11-13 MED ORDER — SODIUM CHLORIDE 0.9 % IJ SOLN
INTRAMUSCULAR | Status: AC
  Administered 2013-11-13: 10 mL via INTRAVENOUS
  Filled 2013-11-13: qty 10

## 2013-11-13 MED ORDER — TECHNETIUM TC 99M SESTAMIBI - CARDIOLITE
10.0000 | Freq: Once | INTRAVENOUS | Status: AC | PRN
  Administered 2013-11-13: 10 via INTRAVENOUS

## 2013-11-13 MED ORDER — TECHNETIUM TC 99M SESTAMIBI GENERIC - CARDIOLITE
30.0000 | Freq: Once | INTRAVENOUS | Status: AC | PRN
  Administered 2013-11-13: 30 via INTRAVENOUS

## 2013-11-13 MED ORDER — DSS 100 MG PO CAPS: 100.0000 mg | ORAL_CAPSULE | Freq: Two times a day (BID) | ORAL | Status: DC

## 2013-11-13 MED ORDER — CLONAZEPAM 0.5 MG PO TABS: 0.5000 mg | ORAL_TABLET | Freq: Two times a day (BID) | ORAL | Status: DC

## 2013-11-13 MED ORDER — REGADENOSON 0.4 MG/5ML IV SOLN
INTRAVENOUS | Status: AC
  Administered 2013-11-13: 0.4 mg via INTRAVENOUS
  Filled 2013-11-13: qty 5

## 2013-11-13 MED ORDER — OXYCODONE-ACETAMINOPHEN 5-325 MG PO TABS: 1.0000 | ORAL_TABLET | ORAL | Status: DC | PRN

## 2013-11-13 MED ORDER — GABAPENTIN 600 MG PO TABS: 600.0000 mg | ORAL_TABLET | Freq: Three times a day (TID) | ORAL | Status: DC

## 2013-11-13 NOTE — Progress Notes (Signed)
Stress Lab Nurses Notes - Clayton Foster  Clayton Foster 11/13/2013 Reason for doing test: CAD and Chest Pain Type of test: Marlane HatcherLexiscan Cardiolite Nurse performing test: Parke PoissonPhyllis Billingsly, RN Nuclear Medicine Tech: Lyndel Pleasureyan Liles Echo Tech: Not Applicable MD performing test: Branch/K.Lyman BishopLawrence NP Family QM:VHQIOND:Health Dept. Test explained and consent signed: yes IV started: 20g jelco, IV in progress from floor and No redness or edema Symptoms: Chest pressure Treatment/Intervention: None Reason test stopped: protocol completed After recovery IV was: Left intact (rate of KVO) and No redness or edema Patient to return to Nuc. Med at : 8:15 Patient discharged: Transported back to room 324 via wc Patient's Condition upon discharge was: stable Comments: During test BP 161/92 & HR 109.  Recovery BP 148/93 & HR 80.  Symptoms resolved in recovery. [name]

## 2013-11-13 NOTE — Progress Notes (Signed)
Patient ID: Tawni LevyRichard W Lacks, male   DOB: 1961-02-20, 53 y.o.   MRN: [id number]     Subjective:    No chest pain  Objective:   Temp:  [97.3 F (36.3 C)-97.6 F (36.4 C)] 97.6 F (36.4 C) (04/23 0557) Pulse Rate:  [73-87] 74 (04/23 0922) Resp:  [16-20] 17 (04/23 0922) BP: (110-142)/(61-84) 142/84 mmHg (04/23 0557) SpO2:  [97 %-99 %] 99 % (04/23 0922) Last BM Date: 11/11/13  Filed Weights   11/11/13 1820 11/12/13 0131  Weight: 236 lb 5 oz (107.191 kg) 233 lb 1.6 oz (105.733 kg)    Intake/Output Summary (Last 24 hours) at 11/13/13 1025 Last data filed at 11/13/13 0900  Gross per 24 hour  Intake 798.13 ml  Output      3 ml  Net 795.13 ml    Exam:  General:NAD  Resp: CTAB  Cardiac: RRR, no m/r/g, no JVD, no carotid bruits  OZ:HYQMVHQGI:abdomen soft, NT, ND  MSK: no LE edema  Neuro: no focal deficits  Psych: appropriate affect  Lab Results:  Basic Metabolic Panel:  Recent Labs Lab 11/11/13 2031 11/12/13 0537 11/13/13 0601  NA 135* 135* 143  K 3.6* 5.9* 4.1  CL 91* 98 102  CO2 29 24 28   GLUCOSE 213* 367* 225*  BUN 9 12 15   CREATININE 0.81 0.77 0.82  CALCIUM 9.9 9.5 9.6    Liver Function Tests:  Recent Labs Lab 11/12/13 0537  AST 44*  ALT 54*  ALKPHOS 89  BILITOT 1.0  PROT 7.3  ALBUMIN 3.7    CBC:  Recent Labs Lab 11/11/13 2031 11/12/13 0537 11/13/13 0601  WBC 9.9 9.2 11.5*  HGB 15.2 14.2 13.5  HCT 41.8 39.5 37.9*  MCV 90.3 90.6 91.8  PLT 226 225 209    Cardiac Enzymes:  Recent Labs Lab 11/12/13 0537 11/12/13 1054 11/12/13 1703  TROPONINI <0.30 <0.30 <0.30    BNP: No results found for this basename: PROBNP,  in the last 8760 hours  Coagulation: No results found for this basename: INR,  in the last 168 hours  ECG:   Medications:   Scheduled Medications: . aspirin EC  81 mg Oral q morning - 10a  . atorvastatin  40 mg Oral Daily  . busPIRone  15 mg Oral BID  . clonazePAM  0.5 mg Oral BID  . docusate sodium  100 mg Oral  BID  . FLUoxetine  20 mg Oral Daily  . hydrochlorothiazide  25 mg Oral Daily  . insulin aspart  0-15 Units Subcutaneous TID WC  . insulin aspart  4 Units Subcutaneous TID WC  . insulin detemir  5 Units Subcutaneous QHS  . lisinopril  20 mg Oral Daily  . metoprolol tartrate  25 mg Oral BID  . nicotine  14 mg Transdermal Daily  . nitroGLYCERIN  1 inch Topical 4 times per day  . pantoprazole  40 mg Oral Daily  . sodium chloride  3 mL Intravenous Q12H  . sodium chloride  3 mL Intravenous Q12H     Infusions: . heparin 1,500 Units/hr (11/13/13 0227)     PRN Medications:  sodium chloride, acetaminophen, acetaminophen, albuterol, cyclobenzaprine, morphine injection, ondansetron (ZOFRAN) IV, ondansetron, oxyCODONE-acetaminophen, sodium chloride     Assessment/Plan    1. Chest pain - no specific  evidence of ischemia by EKG or cardiac enzymes - echo with LVEF 60-65% - Lexiscan dictation is pending, there is a current issue with the software however the result is negative for ischemia. - will  stop heparin - no further workup planned at this time, appears to be non-cardiac chest pain. Will sign off of inpatient care, patient may follow up with NP Lawrence in 2 weeks.      Dina RichJonathan Hennessy Bartel, M.D., F.A.C.C.

## 2013-11-13 NOTE — Progress Notes (Signed)
Occupational Therapy Treatment Patient Details Name: Clayton LevyRichard W Foster MRN: [id number] DOB: 02-01-1961 Today's Date: 11/13/2013    History of present illness Clayton Foster is a 53 y.o. male with a past medical history of type 2 diabetes mellitus, hypertension, hypercholesterolemia, coronary artery disease with stents placed to RCA and LAD in March of 2014, he has had other stents placed as well in the past, chronic back pain with radiculopathy. Patient was supposed to undergo back [facility] last [facility]. However, he developed the heart issues requiring stenting and so surgery had to be postponed. He continues to have severe back problems, especially radiating to his left lower extremity. This has been progressively getting worse over the last many months. Patient is frustrated that he has not been able to undergo surgery yet. He presented with complaints of back pain, but has been having chest pain since Monday night. Located in the retrosternal area, was 7-8/10 in intensity. Was a pressure-like sensation, and then also felt like burning sensation. He tried to take medicines for acid reflux but did not provide any relief. He took 2 aspirins. He found his blood pressure to be high at home. He then subsequently decided to come in to the hospital    OT comments  While preparing for d/c, pt, mother, and OT discussed again WE for LE dressing for decreased pain in bending. Pt able to demonstrate use of reaching and sock aid. Discussed where pt would be able to purchase items if needed, and discussed ideas for how to self-make sock aid if needed. Pt and mother had no further concerns for OT at this time.   Follow Up Recommendations  Home health OT    Equipment Recommendations       Recommendations for Other Services      Precautions / Restrictions         Mobility Bed Mobility                  Transfers                      Balance                                   ADL                       Lower Body Dressing: Minimal assistance Lower Body Dressing Details (indicate cue type and reason): Pt given opportunity to practice with LE dressing adaptive equipemnt. Pt was independent with using reaching to doff sock and min assist to don sock with sock aid. pt verbalized imporved ability to use sock aid if he continues to practice.                      Vision                     Perception     Praxis      Cognition                             Extremity/Trunk Assessment               Exercises     Shoulder Instructions       General Comments      Pertinent Vitals/ Pain  Home Living                                          Prior Functioning/Environment              Frequency Min 1X/week     Progress Toward Goals  OT Goals(current goals can now be found in the care plan section)  Progress towards OT goals: Progressing toward goals     Plan Discharge plan remains appropriate    Co-evaluation                 End of Session     Activity Tolerance Patient tolerated treatment well   Patient Left in bed;with nursing/sitter in room;with family/visitor present   Nurse Communication          Time: [id number] OT Time Calculation (min): 8 min  Charges: OT General Charges $OT Visit: 1 Procedure OT Treatments $Self Care/Home Management : 8-22 mins  Marry GuanMarie Rawlings, MS, OTR/L [phone]  11/13/2013, 1:36 PM

## 2013-11-13 NOTE — Progress Notes (Signed)
ANTICOAGULATION CONSULT NOTE  Pharmacy Consult for Heparin Indication: chest pain/ACS  No Known Allergies  Patient Measurements: Height: 6\' 3"  (190.5 cm) Weight: 233 lb 1.6 oz (105.733 kg) IBW/kg (Calculated) : 84.5 Heparin Dosing Weight: 105 kg  Vital Signs: Temp: 97.6 F (36.4 C) (04/23 0557) Temp src: Oral (04/22 2056) BP: 142/84 mmHg (04/23 0557) Pulse Rate: 73 (04/23 0557)  Labs:  Recent Labs  11/11/13 2031  11/12/13 0537 11/12/13 1054 11/12/13 1703 11/12/13 1842 11/13/13 0601  HGB 15.2  --  14.2  --   --   --  13.5  HCT 41.8  --  39.5  --   --   --  37.9*  PLT 226  --  225  --   --   --  209  HEPARINUNFRC  --   --   --   --   --  0.10* 0.38  CREATININE 0.81  --  0.77  --   --   --  0.82  TROPONINI  --   < > <0.30 <0.30 <0.30  --   --   < > = values in this interval not displayed.  Estimated Creatinine Clearance: 140.2 ml/min (by C-G formula based on Cr of 0.82).   Medical History: Past Medical History  Diagnosis Date  . Anxiety   . Hypertension   . Diabetes mellitus   . Chronic back pain   . Chronic neck pain   . Coronary artery disease     a. 2000: s/p PCI/stenting of prox LAD and RCA @ [facility];  b. 09/2012 Cath/PCI: LM 30d, LAD 40p ISR, 2786m, 8423m/d (2.25x20 Promus Premier DES), LCX 30p, RCA 80p (3.0x24 Promus Premier DES), patent mid stent, 4686m, PDA 6050m (2.75x28 Promus Premier DES), EF 60-65%.  . Hyperlipidemia   . Tobacco abuse     Medications:  Scheduled:  . aspirin EC  81 mg Oral q morning - 10a  . atorvastatin  40 mg Oral Daily  . busPIRone  15 mg Oral BID  . clonazePAM  0.5 mg Oral BID  . docusate sodium  100 mg Oral BID  . FLUoxetine  20 mg Oral Daily  . hydrochlorothiazide  25 mg Oral Daily  . insulin aspart  0-15 Units Subcutaneous TID WC  . insulin aspart  4 Units Subcutaneous TID WC  . insulin detemir  5 Units Subcutaneous QHS  . lisinopril  20 mg Oral Daily  . metoprolol tartrate  25 mg Oral BID  . nicotine  14 mg Transdermal Daily   . nitroGLYCERIN  1 inch Topical 4 times per day  . pantoprazole  40 mg Oral Daily  . sodium chloride  3 mL Intravenous Q12H  . sodium chloride  3 mL Intravenous Q12H    Assessment: 53 yo M with extensive hx CAD presented with chest pain- cardiac vs GI.  Heparin until work-up complete.  No bleeding noted.  CBC stable.  Heparin level @ Goal  Goal of Therapy:  Heparin level 0.3-0.7 units/ml Monitor platelets by anticoagulation protocol: Yes   Plan:  Continue Heparin @ 1500 units/hr Daily labs. F/U plan.  Mady GemmaMichael R Trevelle Mcgurn 11/13/2013,8:09 AM

## 2013-11-13 NOTE — Progress Notes (Addendum)
Inpatient Diabetes Program Recommendations  AACE/ADA: New Consensus Statement on Inpatient Glycemic Control (2013)  Target Ranges:  Prepandial:   less than 140 mg/dL      Peak postprandial:   less than 180 mg/dL (1-2 hours)      Critically ill patients:  140 - 180 mg/dL   Results for Clayton Foster, Clayton Foster (MRN [id number]) as of 11/13/2013 08:10  Ref. Range 11/12/2013 07:25 11/12/2013 11:25 11/12/2013 16:48 11/12/2013 20:53  Glucose-Capillary Latest Range: 70-99 mg/dL 409300 (H) 811260 (H) 914293 (H) 312 (H)   Diabetes history: DM2 Outpatient Diabetes medications: Metformin 1000 mg BID  Current orders for Inpatient glycemic control: Levemir 5 units QHS, Novolog 0-15 units AC, Novolog 4 units TID with meals  Inpatient Diabetes Program Recommendations Insulin - Basal: Please increase Levemir to 10 units QHS. Correction (SSI): Please conisder ordering Novolog bedtime correction scale.  Thanks, Orlando PennerMarie Cerina Leary, RN, MSN, CCRN Diabetes Coordinator Inpatient Diabetes Program [phone] (Team Pager) [phone] (AP office) [phone] [facility] office)

## 2013-11-13 NOTE — Discharge Summary (Addendum)
Physician Discharge Summary  Adelfa KohRichard W Karnik XBJ:[id number] DOB: Apr 08, 1961 DOA: 11/11/2013  PCP: MUSE,ROCHELLE D., PA-C  Admit date: 11/11/2013 Discharge date: 11/13/2013  Time spent: 45 minutes  Recommendations for Outpatient Follow-up:  -Will be discharged home today in stable condition. -Advised to follow up with PCP in 2 weeks.   Discharge Diagnoses:  Principal Problem:   Chest pain Active Problems:   Diabetes mellitus, type 2   Hypertension   High cholesterol   Back pain with radiculopathy   CAD (coronary artery disease)   Tobacco use   Discharge Condition: Stable and improved  Filed Weights   11/11/13 1820 11/12/13 0131  Weight: 107.191 kg (236 lb 5 oz) 105.733 kg (233 lb 1.6 oz)    History of present illness:   Clayton Foster is a 53 y.o. male with a past medical history of type 2 diabetes mellitus, hypertension, hypercholesterolemia, coronary artery disease with stents placed to RCA and LAD in March of 2014, he has had other stents placed as well in the past, chronic back pain with radiculopathy. Patient was supposed to [facility]. However, he developed the heart issues requiring stenting and so surgery had to be postponed. He continues to have severe back problems, especially radiating to his left lower extremity. This has been progressively getting worse over the last many months. Patient is frustrated that he has not been able to undergo surgery yet. He presented with complaints of back pain, but has been having chest pain since Monday night. Located in the retrosternal area, was 7-8/10 in intensity. Was a pressure-like sensation, and then also felt like burning sensation. He tried to take medicines for acid reflux but did not provide any relief. He took 2 aspirins. He found his blood pressure to be high at home. He then subsequently decided to come in to the hospital tonight. No nausea, vomiting, no dizziness or lightheadedness. No  shortness of breath. Currently, the pain is 4/10 in intensity. Denies any radiation of this chest pain. Denies any leg swelling. Hospitalist admission was requested.  Hospital Course:   Chest Pain  -Etiology remains unclear. -Has had a stress myoview that was negative for signs of ischemia. -No further cardiac work up planned for at this time. -Appreciate cardiology input.  Back Pain  -Is mostly chronic.  -Back surgery has been deferred due to his ongoing cardiac issues.  -[facility] once stale from a cardiac perspective.  -Is working on referral to a pain clinic. -Have given him #15 tablets of percocet.  HTN  -Well controlled.   DM II  -Fair control. -Continue home medications.  Hypokalemia  -Over corrected.  -No further supplementation.  -Has drifted down to 4.1 on DC.     Procedures:  None   Consultations:  None  Discharge Instructions  Discharge Orders   Future Appointments Provider Department Dept Phone   12/01/2013 1:10 PM Jodelle GrossKathryn M Lawrence, NP [facility] [place] [phone]   Future Orders Complete By Expires   Diet - low sodium heart healthy  As directed    Discontinue IV  As directed    Increase activity slowly  As directed        Medication List         acetaminophen 500 MG tablet  Commonly known as:  TYLENOL  Take 1,000 mg by mouth daily as needed for pain.     aspirin EC 81 MG tablet  Take 81 mg by mouth every  morning.     atorvastatin 40 MG tablet  Commonly known as:  LIPITOR  Take 40 mg by mouth daily.     busPIRone 15 MG tablet  Commonly known as:  BUSPAR  Take 15 mg by mouth 2 (two) times daily.     clonazePAM 0.5 MG tablet  Commonly known as:  KLONOPIN  Take 1 tablet (0.5 mg total) by mouth 2 (two) times daily.     cyclobenzaprine 10 MG tablet  Commonly known as:  FLEXERIL  Take 10 mg by mouth 3 (three) times daily as needed. Muscle Spasms     DSS 100 MG Caps  Take 100 mg by mouth 2 (two) times daily.      FLUoxetine 20 MG capsule  Commonly known as:  PROZAC  Take 20 mg by mouth daily.     gabapentin 600 MG tablet  Commonly known as:  NEURONTIN  Take 1 tablet (600 mg total) by mouth 3 (three) times daily.     metFORMIN 1000 MG tablet  Commonly known as:  GLUCOPHAGE  Take 1 tablet (1,000 mg total) by mouth 2 (two) times daily with a meal.     metoprolol tartrate 25 MG tablet  Commonly known as:  LOPRESSOR  Take 1 tablet (25 mg total) by mouth 2 (two) times daily.     nitroGLYCERIN 0.4 MG SL tablet  Commonly known as:  NITROSTAT  Place 1 tablet (0.4 mg total) under the tongue every 5 (five) minutes as needed for chest pain.     oxyCODONE-acetaminophen 5-325 MG per tablet  Commonly known as:  PERCOCET/ROXICET  Take 1 tablet by mouth every 4 (four) hours as needed.     pantoprazole 40 MG tablet  Commonly known as:  PROTONIX  Take 40 mg by mouth daily.     quinapril-hydrochlorothiazide 20-25 MG per tablet  Commonly known as:  ACCURETIC  Take 1 tablet by mouth every morning.       No Known Allergies     Follow-up Information   Follow up with [facility] On 11/28/2013. (at 9:30 am., Please have MD name on M'caid card changed to [facility].)    Contact information:   [facility] [address] [place], [place] [address] [phone]       The results of significant diagnostics from this hospitalization (including imaging, microbiology, ancillary and laboratory) are listed below for reference.    Significant Diagnostic Studies: Dg Sacrum/coccyx  11/11/2013   CLINICAL DATA:  fell in sitting position  EXAM: SACRUM AND COCCYX - 2+ VIEW  COMPARISON:  None.  FINDINGS: There is no evidence of fracture or other focal bone lesions. Degenerative disc disease changes are appreciated at L5-S1.  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 No acute osseous abnormalities.   Electronically Signed   By: Salome HolmesHector  Cooper M.D.   On: 11/11/2013 20:48    Nm Myocar Single W/spect W/wall Motion And Ef  11/13/2013   : Please select correct template.   Electronically Signed   By: Ulyses SouthwardMark  Boles M.D.   On: 11/13/2013 09:40   Dg Chest Port 1 View  11/12/2013   CLINICAL DATA:  Chest pain.  EXAM: PORTABLE CHEST - 1 VIEW  COMPARISON:  DG CHEST 2 VIEW dated 10/10/2012  FINDINGS: Mediastinum hilar structures are normal. Cardiomegaly with normal pulmonary vascularity. No pleural effusion or pneumothorax. No acute bony abnormality.  IMPRESSION: Mild cardiomegaly, no CHF.  No acute pulmonary disease .   Electronically  Signed   ByMaisie Fus  Register   On: 11/12/2013 14:29    Microbiology: No results found for this or any previous visit (from the past 240 hour(s)).   Labs: Basic Metabolic Panel:  Recent Labs Lab 11/11/13 2031 11/12/13 0537 11/13/13 0601  NA 135* 135* 143  K 3.6* 5.9* 4.1  CL 91* 98 102  CO2 29 24 28   GLUCOSE 213* 367* 225*  BUN 9 12 15   CREATININE 0.81 0.77 0.82  CALCIUM 9.9 9.5 9.6   Liver Function Tests:  Recent Labs Lab 11/12/13 0537  AST 44*  ALT 54*  ALKPHOS 89  BILITOT 1.0  PROT 7.3  ALBUMIN 3.7   No results found for this basename: LIPASE, AMYLASE,  in the last 168 hours No results found for this basename: AMMONIA,  in the last 168 hours CBC:  Recent Labs Lab 11/11/13 2031 11/12/13 0537 11/13/13 0601  WBC 9.9 9.2 11.5*  NEUTROABS 4.4  --   --   HGB 15.2 14.2 13.5  HCT 41.8 39.5 37.9*  MCV 90.3 90.6 91.8  PLT 226 225 209   Cardiac Enzymes:  Recent Labs Lab 11/11/13 1949 11/11/13 2301 11/12/13 0537 11/12/13 1054 11/12/13 1703  TROPONINI <0.30 <0.30 <0.30 <0.30 <0.30   BNP: BNP (last 3 results) No results found for this basename: PROBNP,  in the last 8760 hours CBG:  Recent Labs Lab 11/12/13 1125 11/12/13 1648 11/12/13 2053 11/13/13 0850 11/13/13 1133  GLUCAP 260* 293* 312* 228* 264*       Signed:  Henderson Cloud  [facility] Pager: [phone] 11/13/2013, 1:31  PM

## 2013-11-14 ENCOUNTER — Ambulatory Visit: Payer: Medicaid Other | Admitting: Adult Health

## 2013-11-18 ENCOUNTER — Telehealth: Payer: Self-pay | Admitting: Adult Health

## 2013-11-18 NOTE — Telephone Encounter (Signed)
Made pt aware. Pt felt better after I spoke to him.

## 2013-11-18 NOTE — Telephone Encounter (Signed)
Patient wants to know what Cardiomegaly means.  He saw it on My Chart. / tgs

## 2013-12-01 ENCOUNTER — Encounter: Payer: Self-pay | Admitting: Adult Health

## 2013-12-01 ENCOUNTER — Encounter: Payer: Medicaid Other | Admitting: Adult Health

## 2013-12-01 NOTE — Progress Notes (Signed)
Error

## 2013-12-23 ENCOUNTER — Other Ambulatory Visit (HOSPITAL_COMMUNITY): Payer: Self-pay | Admitting: Physical Medicine and Rehabilitation

## 2013-12-23 DIAGNOSIS — M5137 Other intervertebral disc degeneration, lumbosacral region: Secondary | ICD-10-CM

## 2013-12-26 ENCOUNTER — Ambulatory Visit (HOSPITAL_COMMUNITY)
Admission: RE | Admit: 2013-12-26 | Discharge: 2013-12-26 | Disposition: A | Discharge status: Home / Self Care | Payer: Medicaid Other | Source: Ambulatory Visit | Attending: Physical Medicine and Rehabilitation | Admitting: Physical Medicine and Rehabilitation

## 2013-12-26 DIAGNOSIS — M5137 Other intervertebral disc degeneration, lumbosacral region: Secondary | ICD-10-CM

## 2013-12-26 DIAGNOSIS — M5126 Other intervertebral disc displacement, lumbar region: Secondary | ICD-10-CM | POA: Insufficient documentation

## 2013-12-26 DIAGNOSIS — R209 Unspecified disturbances of skin sensation: Secondary | ICD-10-CM | POA: Insufficient documentation

## 2013-12-26 DIAGNOSIS — M545 Low back pain, unspecified: Secondary | ICD-10-CM | POA: Insufficient documentation

## 2013-12-26 DIAGNOSIS — M48061 Spinal stenosis, lumbar region without neurogenic claudication: Secondary | ICD-10-CM | POA: Insufficient documentation

## 2013-12-26 LAB — POCT I-STAT CREATININE: Creatinine, Ser: 0.9 mg/dL (ref 0.50–1.35)

## 2013-12-26 MED ORDER — GADOBENATE DIMEGLUMINE 529 MG/ML IV SOLN
20.0000 mL | Freq: Once | INTRAVENOUS | Status: AC | PRN
  Administered 2013-12-26: 20 mL via INTRAVENOUS

## 2014-01-29 ENCOUNTER — Encounter: Payer: Self-pay | Admitting: Adult Health

## 2014-01-29 ENCOUNTER — Encounter: Payer: Self-pay | Admitting: *Deleted

## 2014-01-29 ENCOUNTER — Ambulatory Visit (INDEPENDENT_AMBULATORY_CARE_PROVIDER_SITE_OTHER): Payer: Medicaid Other | Admitting: Adult Health

## 2014-01-29 VITALS — BP 110/72 | HR 67 | Ht 75.0 in | Wt 243.0 lb

## 2014-01-29 DIAGNOSIS — I10 Essential (primary) hypertension: Secondary | ICD-10-CM

## 2014-01-29 MED ORDER — NITROGLYCERIN 0.4 MG SL SUBL: 0.4000 mg | SUBLINGUAL_TABLET | SUBLINGUAL | Status: AC | PRN

## 2014-01-29 NOTE — Assessment & Plan Note (Signed)
Continue statin therapy with atorvastatin 40 mg daily. Remain on low cholesterol diet.

## 2014-01-29 NOTE — Assessment & Plan Note (Signed)
Excellent control of blood pressure at this time. No changes in his medication regimen. We will see him again in 4 months postoperatively unless he is to see us sooner.

## 2014-01-29 NOTE — Progress Notes (Signed)
HPI: [name] is a 53 year old patient of Dr. Excell Seltzerooper on for ongoing assessment and management of CAD with stenting to the right coronary artery and LAD in 2000, this is [facility]. He had a recent cardiac catheterization in March of 2000 4T with PCI using drug-eluting stent at the site of the previous stent placement. The patient was continued on the last platelet therapy. She was last in the office approximately one year ago without complaint.  Unfortunately the patient was admitted to the hospital in April of 2015 with complaints of chest pain with ongoing diabetes hypertension hypercholesterolemia along with back pain with radiculopathy. A stress Myoview was completed with no signs of new ischemia. No further cardiac workup is planned during hospitalization. Felt that the chest pain was related more musculoskeletal. He was taken off of potassium,due to hyperkalemia. Marland Kitchen.     He comes today without cardiac complaint. He is to plan for [facility], Dr. Laurine BlazerGuthrie, who wishes to have this completed within the next month. He is in need a cardiac clearance in order to proceed. He has been taken off of Plavix during most recent hospitalization in April. He denies recurrent chest pain, dyspnea on exertion, or palpitations. He is medically compliant.    No Known Allergies  Current Outpatient Prescriptions  Medication Sig Dispense Refill  . acetaminophen (TYLENOL) 500 MG tablet Take 1,000 mg by mouth daily as needed for pain.       Marland Kitchen. aspirin EC 81 MG tablet Take 81 mg by mouth every morning.       Marland Kitchen. atorvastatin (LIPITOR) 40 MG tablet Take 40 mg by mouth daily.      . busPIRone (BUSPAR) 15 MG tablet Take 30 mg by mouth at bedtime.       . clonazePAM (KLONOPIN) 0.5 MG tablet Take 1 tablet (0.5 mg total) by mouth 2 (two) times daily.  60 tablet  0  . cyclobenzaprine (FLEXERIL) 10 MG tablet Take 10 mg by mouth 3 (three) times daily as needed. Muscle Spasms      .  Docusate Sodium (DSS) 100 MG CAPS Take 100 mg by mouth as needed.      . DULoxetine (CYMBALTA) 30 MG capsule Take 30 mg by mouth daily.      Marland Kitchen. gabapentin (NEURONTIN) 600 MG tablet Take 600 mg by mouth 4 (four) times daily.      . metFORMIN (GLUCOPHAGE) 1000 MG tablet Take 1 tablet (1,000 mg total) by mouth 2 (two) times daily with a meal.  60 tablet  1  . metoprolol tartrate (LOPRESSOR) 25 MG tablet Take 1 tablet (25 mg total) by mouth 2 (two) times daily.  60 tablet  6  . nitroGLYCERIN (NITROSTAT) 0.4 MG SL tablet Place 1 tablet (0.4 mg total) under the tongue every 5 (five) minutes as needed for chest pain.  25 tablet  1  . oxyCODONE-acetaminophen (PERCOCET/ROXICET) 5-325 MG per tablet Take 1 tablet by mouth daily as needed.      Marland Kitchen. oxymorphone (OPANA) 5 MG tablet Take 5 mg by mouth 2 (two) times daily.      . pantoprazole (PROTONIX) 40 MG tablet Take 40 mg by mouth daily.      . quinapril-hydrochlorothiazide (ACCURETIC) 20-25 MG per tablet Take 1 tablet by mouth every morning.       No current facility-administered medications for this visit.    Past Medical History  Diagnosis Date  . Anxiety   . Hypertension   .  Diabetes mellitus   . Chronic back pain   . Chronic neck pain   . Coronary artery disease     a. 2000: s/p PCI/stenting of prox LAD and RCA @ [facility];  b. 09/2012 Cath/PCI: LM 30d, LAD 40p ISR, 59m, 51m/d (2.25x20 Promus Premier DES), LCX 30p, RCA 80p (3.0x24 Promus Premier DES), patent mid stent, 100m, PDA 57m (2.75x28 Promus Premier DES), EF 60-65%.  . Hyperlipidemia   . Tobacco abuse     Past Surgical History  Procedure Laterality Date  . Back surgery    . Appendectomy    . Tonsillectomy    . Cardiac stents      ROS: Review of systems complete and found to be negative unless listed above  PHYSICAL EXAM BP 110/72  Pulse 67  Ht 6\' 3"  (1.905 m)  Wt 243 lb (110.224 kg)  BMI 30.37 kg/m2 General: Well developed, well nourished, in no acute distress Head: Eyes PERRLA, No  xanthomas.   Normal cephalic and atraumatic. Upper missing teeth  Lungs: Clear bilaterally to auscultation and percussion. Heart: HRRR S1 S2, without MRG.  Pulses are 2+ & equal.            No carotid bruit. No JVD.  No abdominal bruits. No femoral bruits. Abdomen: Bowel sounds are positive, abdomen soft and non-tender without masses or                  Hernia's noted. Msk:  Back normal, slow, cane assisted gait Normal strength and tone for age. Extremities: No clubbing, cyanosis or edema.  DP +1 Neuro: Alert and oriented X 3. Psych:  Good affect, responds appropriately   EKG:    ASSESSMENT AND PLAN

## 2014-01-29 NOTE — Progress Notes (Deleted)
Name: Clayton Foster    DOB: 17-Feb-1961  Age: 53 y.o.  MR#: [id number]       PCP:  Tylene Fantasia., PA-C      Insurance: Payor: MEDICAID [place] / Plan: MEDICAID [place] ACCESS / Product Type: *No Product type* /   CC:    Chief Complaint  Patient presents with  . Coronary Artery Disease    VS Filed Vitals:   01/29/14 1403  BP: 110/72  Pulse: 67  Height: 6\' 3"  (1.905 m)  Weight: 243 lb (110.224 kg)    Weights Current Weight  01/29/14 243 lb (110.224 kg)  11/12/13 233 lb 1.6 oz (105.733 kg)  06/06/13 245 lb (111.131 kg)    Blood Pressure  BP Readings from Last 3 Encounters:  01/29/14 110/72  11/13/13 142/84  10/21/13 147/84     Admit date:  (Not on file) Last encounter with RMR:  11/18/2013   Allergy Review of patient's allergies indicates no known allergies.  Current Outpatient Prescriptions  Medication Sig Dispense Refill  . acetaminophen (TYLENOL) 500 MG tablet Take 1,000 mg by mouth daily as needed for pain.       Marland Kitchen aspirin EC 81 MG tablet Take 81 mg by mouth every morning.       Marland Kitchen atorvastatin (LIPITOR) 40 MG tablet Take 40 mg by mouth daily.      . busPIRone (BUSPAR) 15 MG tablet Take 30 mg by mouth at bedtime.       . clonazePAM (KLONOPIN) 0.5 MG tablet Take 1 tablet (0.5 mg total) by mouth 2 (two) times daily.  60 tablet  0  . cyclobenzaprine (FLEXERIL) 10 MG tablet Take 10 mg by mouth 3 (three) times daily as needed. Muscle Spasms      . Docusate Sodium (DSS) 100 MG CAPS Take 100 mg by mouth as needed.      . DULoxetine (CYMBALTA) 30 MG capsule Take 30 mg by mouth daily.      Marland Kitchen gabapentin (NEURONTIN) 600 MG tablet Take 600 mg by mouth 4 (four) times daily.      . metFORMIN (GLUCOPHAGE) 1000 MG tablet Take 1 tablet (1,000 mg total) by mouth 2 (two) times daily with a meal.  60 tablet  1  . metoprolol tartrate (LOPRESSOR) 25 MG tablet Take 1 tablet (25 mg total) by mouth 2 (two) times daily.  60 tablet  6  . nitroGLYCERIN (NITROSTAT) 0.4 MG SL tablet Place 1 tablet  (0.4 mg total) under the tongue every 5 (five) minutes as needed for chest pain.  25 tablet  1  . oxyCODONE-acetaminophen (PERCOCET/ROXICET) 5-325 MG per tablet Take 1 tablet by mouth daily as needed.      Marland Kitchen oxymorphone (OPANA) 5 MG tablet Take 5 mg by mouth 2 (two) times daily.      . pantoprazole (PROTONIX) 40 MG tablet Take 40 mg by mouth daily.      . quinapril-hydrochlorothiazide (ACCURETIC) 20-25 MG per tablet Take 1 tablet by mouth every morning.       No current facility-administered medications for this visit.    Discontinued Meds:    Medications Discontinued During This Encounter  Medication Reason  . FLUoxetine (PROZAC) 20 MG capsule Error  . gabapentin (NEURONTIN) 600 MG tablet   . docusate sodium 100 MG CAPS   . nitroGLYCERIN (NITROSTAT) 0.4 MG SL tablet Reorder  . oxyCODONE-acetaminophen (PERCOCET/ROXICET) 5-325 MG per tablet     Patient Active Problem List   Diagnosis Date Noted  . Intermediate coronary  syndrome 10/14/2012  . Hyponatremia 10/10/2012  . Chest pain 07/22/2012  . CAD (coronary artery disease) 07/22/2012  . Panic disorder 07/22/2012  . Tobacco use 07/22/2012  . GERD (gastroesophageal reflux disease) 07/22/2012  . Diabetes mellitus, type 2 09/28/2011  . Hypertension 09/28/2011  . High cholesterol 09/28/2011  . Back pain with radiculopathy 09/28/2011    LABS    Component Value Date/Time   NA 143 11/13/2013 0601   NA 135* 11/12/2013 0537   NA 135* 11/11/2013 2031   K 4.1 11/13/2013 0601   K 5.9* 11/12/2013 0537   K 3.6* 11/11/2013 2031   CL 102 11/13/2013 0601   CL 98 11/12/2013 0537   CL 91* 11/11/2013 2031   CO2 28 11/13/2013 0601   CO2 24 11/12/2013 0537   CO2 29 11/11/2013 2031   GLUCOSE 225* 11/13/2013 0601   GLUCOSE 367* 11/12/2013 0537   GLUCOSE 213* 11/11/2013 2031   BUN 15 11/13/2013 0601   BUN 12 11/12/2013 0537   BUN 9 11/11/2013 2031   CREATININE 0.90 12/26/2013 1400   CREATININE 0.82 11/13/2013 0601   CREATININE 0.77 11/12/2013 0537   CALCIUM  9.6 11/13/2013 0601   CALCIUM 9.5 11/12/2013 0537   CALCIUM 9.9 11/11/2013 2031   GFRNONAA >90 11/13/2013 0601   GFRNONAA >90 11/12/2013 0537   GFRNONAA >90 11/11/2013 2031   GFRAA >90 11/13/2013 0601   GFRAA >90 11/12/2013 0537   GFRAA >90 11/11/2013 2031   CMP     Component Value Date/Time   NA 143 11/13/2013 0601   K 4.1 11/13/2013 0601   CL 102 11/13/2013 0601   CO2 28 11/13/2013 0601   GLUCOSE 225* 11/13/2013 0601   BUN 15 11/13/2013 0601   CREATININE 0.90 12/26/2013 1400   CALCIUM 9.6 11/13/2013 0601   PROT 7.3 11/12/2013 0537   ALBUMIN 3.7 11/12/2013 0537   AST 44* 11/12/2013 0537   ALT 54* 11/12/2013 0537   ALKPHOS 89 11/12/2013 0537   BILITOT 1.0 11/12/2013 0537   GFRNONAA >90 11/13/2013 0601   GFRAA >90 11/13/2013 0601       Component Value Date/Time   WBC 11.5* 11/13/2013 0601   WBC 9.2 11/12/2013 0537   WBC 9.9 11/11/2013 2031   HGB 13.5 11/13/2013 0601   HGB 14.2 11/12/2013 0537   HGB 15.2 11/11/2013 2031   HCT 37.9* 11/13/2013 0601   HCT 39.5 11/12/2013 0537   HCT 41.8 11/11/2013 2031   MCV 91.8 11/13/2013 0601   MCV 90.6 11/12/2013 0537   MCV 90.3 11/11/2013 2031    Lipid Panel     Component Value Date/Time   CHOL 187 10/10/2012 1036   TRIG 173* 10/10/2012 1036   HDL 27* 10/10/2012 1036   CHOLHDL 6.9 10/10/2012 1036   VLDL 35 10/10/2012 1036   LDLCALC 125* 10/10/2012 1036    ABG No results found for this basename: phart, pco2, pco2art, po2, po2art, hco3, tco2, acidbasedef, o2sat     No results found for this basename: TSH   BNP (last 3 results) No results found for this basename: PROBNP,  in the last 8760 hours Cardiac Panel (last 3 results) No results found for this basename: CKTOTAL, CKMB, TROPONINI, RELINDX,  in the last 72 hours  Iron/TIBC/Ferritin/ %Sat No results found for this basename: iron, tibc, ferritin, ironpctsat     EKG Orders placed during the hospital encounter of 11/11/13  . ED EKG  . ED EKG  . EKG 12-LEAD  . EKG 12-LEAD  . EKG 12-LEAD  .  EKG 12-LEAD   . EKG 12-LEAD  . EKG 12-LEAD  . EKG 12-LEAD  . EKG 12-LEAD  . EKG 12-LEAD  . EKG 12-LEAD  . EKG 12-LEAD  . EKG 12-LEAD  . EKG     Prior Assessment and Plan Problem List as of 01/29/2014     Cardiovascular and Mediastinum   Hypertension   Last Assessment & Plan   02/21/2013 Office Visit Written 02/21/2013  1:27 PM by Jodelle GrossKathryn M Lawrence, NP     BP is controlled. He has lost 9 lbs and is medically complaint. No changes at this time in meds. See him again in 3 months.    CAD (coronary artery disease)   Last Assessment & Plan   02/21/2013 Office Visit Written 02/21/2013  1:25 PM by Jodelle GrossKathryn M Lawrence, NP     He is stable from CV standpoint. He has no recurrence of angina or issues with medical non-compliance. He will continue current medication regimen. He is to see us in 3 months for pre-operative evaluation and recommendations for Plavix cessation peri-operatively back surgery.     Intermediate coronary syndrome     Digestive   GERD (gastroesophageal reflux disease)     Endocrine   Diabetes mellitus, type 2     Other   High cholesterol   Back pain with radiculopathy   Last Assessment & Plan   02/21/2013 Office Visit Written 02/21/2013  1:28 PM by Jodelle GrossKathryn M Lawrence, NP     Surgery is planned for Sept or Oct. He will make appt when surgery date is set for pre-operative evaluation.    Chest pain   Panic disorder   Last Assessment & Plan   02/21/2013 Office Visit Written 02/21/2013  1:26 PM by Jodelle GrossKathryn M Lawrence, NP     He talks today about having more panic attacks more often the last few days. He will be seeing psychiatrist this month.    Tobacco use   Last Assessment & Plan   10/22/2012 Office Visit Written 10/22/2012  2:20 PM by Jodelle GrossKathryn M Lawrence, NP     This has been discussed, he is cutting down on his cigarette use. He has a history of anxiety and uses this to help calm himself down. I explained the risks associated with continued smoking and recurrence of her artery stenosis. He  verbalizes understanding.    Hyponatremia       Imaging: No results found.

## 2014-01-29 NOTE — Assessment & Plan Note (Signed)
The patient had a repeat cardiac stress test completed in April 2015 is found to be negative for ischemia. The patient had normal LV systolic function, and was found a low risk study for any major cardiac events. EKG is completed during this office visit revealing normal sinus rhythm without evidence of ischemia AV blocks or arrhythmias. PR interval 0.136 milliseconds QT 404, with a QTC of 420 ms. Blood pressure is excellently controlled.  The patient is clear from a cardiac standpoint to proceed with back surgery. Would continue beta blockers metoprolol 25 mg twice a day, and aspirin perioperatively along with quinapril HCTZ for blood pressure control. A copy of the stress test and EKG will be sent with a letter to Dr. Laurine BlazerGuthrie, with a copy to the patient, for documentation that the patient can proceed with surgery.

## 2014-01-29 NOTE — Patient Instructions (Addendum)
Your physician wants you to follow-up in: 5 months. You will receive a reminder letter in the mail two months in advance. If you don't receive a letter, please call our office to schedule the follow-up appointment.  Your physician recommends that you continue on your current medications as directed. Please refer to the Current Medication list given to you today.  [facility] for [facility].  Thank you for choosing [facility]!!

## 2014-02-13 ENCOUNTER — Telehealth: Payer: Self-pay | Admitting: *Deleted

## 2014-02-13 MED ORDER — QUINAPRIL-HYDROCHLOROTHIAZIDE 20-25 MG PO TABS: 1.0000 | ORAL_TABLET | Freq: Every morning | ORAL | Status: DC

## 2014-02-13 NOTE — Telephone Encounter (Signed)
quinapril-hydrochlorothiazide (ACCURETIC) 20-25 MG per tablet

## 2014-02-13 NOTE — Telephone Encounter (Signed)
Medication sent via escribe.  

## 2014-02-16 ENCOUNTER — Telehealth: Payer: Self-pay | Admitting: Internal Medicine

## 2014-02-16 NOTE — Telephone Encounter (Signed)
Please see refill bin / tgs  °

## 2014-02-19 ENCOUNTER — Other Ambulatory Visit: Payer: Self-pay | Admitting: *Deleted

## 2014-02-19 ENCOUNTER — Telehealth: Payer: Self-pay | Admitting: Adult Health

## 2014-02-19 MED ORDER — QUINAPRIL-HYDROCHLOROTHIAZIDE 20-25 MG PO TABS: 1.0000 | ORAL_TABLET | Freq: Every morning | ORAL | Status: DC

## 2014-02-19 NOTE — Telephone Encounter (Signed)
Please see refill bin / tgs  °

## 2014-02-23 MED ORDER — QUINAPRIL-HYDROCHLOROTHIAZIDE 20-25 MG PO TABS: 1.0000 | ORAL_TABLET | Freq: Every morning | ORAL | Status: DC

## 2014-02-23 NOTE — Telephone Encounter (Signed)
Approval for quinapril-HCTZ 20-25 mg tablet until 02-15-15.

## 2014-07-02 ENCOUNTER — Encounter (HOSPITAL_COMMUNITY): Payer: Self-pay | Admitting: Cardiology

## 2014-07-06 ENCOUNTER — Emergency Department (HOSPITAL_COMMUNITY)
Admission: EM | Admit: 2014-07-06 | Discharge: 2014-07-06 | Disposition: A | Discharge status: Home / Self Care | Payer: Medicaid Other | Attending: Emergency Medicine | Admitting: Emergency Medicine

## 2014-07-06 ENCOUNTER — Encounter (HOSPITAL_COMMUNITY): Payer: Self-pay | Admitting: Emergency Medicine

## 2014-07-06 DIAGNOSIS — E860 Dehydration: Secondary | ICD-10-CM | POA: Insufficient documentation

## 2014-07-06 DIAGNOSIS — I2510 Atherosclerotic heart disease of native coronary artery without angina pectoris: Secondary | ICD-10-CM | POA: Diagnosis not present

## 2014-07-06 DIAGNOSIS — Z7982 Long term (current) use of aspirin: Secondary | ICD-10-CM | POA: Insufficient documentation

## 2014-07-06 DIAGNOSIS — I10 Essential (primary) hypertension: Secondary | ICD-10-CM | POA: Diagnosis not present

## 2014-07-06 DIAGNOSIS — E785 Hyperlipidemia, unspecified: Secondary | ICD-10-CM | POA: Insufficient documentation

## 2014-07-06 DIAGNOSIS — Z79899 Other long term (current) drug therapy: Secondary | ICD-10-CM | POA: Diagnosis not present

## 2014-07-06 DIAGNOSIS — Z720 Tobacco use: Secondary | ICD-10-CM | POA: Diagnosis not present

## 2014-07-06 DIAGNOSIS — R739 Hyperglycemia, unspecified: Secondary | ICD-10-CM

## 2014-07-06 DIAGNOSIS — F419 Anxiety disorder, unspecified: Secondary | ICD-10-CM | POA: Insufficient documentation

## 2014-07-06 DIAGNOSIS — E1165 Type 2 diabetes mellitus with hyperglycemia: Secondary | ICD-10-CM | POA: Diagnosis present

## 2014-07-06 DIAGNOSIS — Z9861 Coronary angioplasty status: Secondary | ICD-10-CM | POA: Diagnosis not present

## 2014-07-06 LAB — BASIC METABOLIC PANEL
Anion gap: 15 (ref 5–15)
BUN: 6 mg/dL (ref 6–23)
CALCIUM: 9.8 mg/dL (ref 8.4–10.5)
CHLORIDE: 94 meq/L — AB (ref 96–112)
CO2: 24 mEq/L (ref 19–32)
CREATININE: 0.62 mg/dL (ref 0.50–1.35)
GFR calc Af Amer: >90 mL/min (ref >90)
GFR calc non Af Amer: >90 mL/min (ref >90)
GLUCOSE: 456 mg/dL — AB (ref 70–99)
Potassium: 4.1 mEq/L (ref 3.7–5.3)
Sodium: 133 mEq/L — ABNORMAL LOW (ref 137–147)

## 2014-07-06 LAB — CBC WITH DIFFERENTIAL/PLATELET
BASOS ABS: 0.1 10*3/uL (ref 0.0–0.1)
BASOS PCT: 1 % (ref 0–1)
EOS PCT: 4 % (ref 0–5)
Eosinophils Absolute: 0.3 10*3/uL (ref 0.0–0.7)
HCT: 41.5 % (ref 39.0–52.0)
Hemoglobin: 15.2 g/dL (ref 13.0–17.0)
Lymphocytes Relative: 42 % (ref 12–46)
Lymphs Abs: 3.2 10*3/uL (ref 0.7–4.0)
MCH: 33.1 pg (ref 26.0–34.0)
MCHC: 36.6 g/dL — AB (ref 30.0–36.0)
MCV: 90.4 fL (ref 78.0–100.0)
MONO ABS: 0.5 10*3/uL (ref 0.1–1.0)
Monocytes Relative: 7 % (ref 3–12)
Neutro Abs: 3.6 10*3/uL (ref 1.7–7.7)
Neutrophils Relative %: 47 % (ref 43–77)
Platelets: 231 10*3/uL (ref 150–400)
RBC: 4.59 MIL/uL (ref 4.22–5.81)
RDW: 12.6 % (ref 11.5–15.5)
WBC: 7.6 10*3/uL (ref 4.0–10.5)

## 2014-07-06 LAB — CBG MONITORING, ED
Glucose-Capillary: 528 mg/dL — ABNORMAL HIGH (ref 70–99)
Glucose-Capillary: 322 mg/dL — ABNORMAL HIGH (ref 70–99)

## 2014-07-06 MED ORDER — SODIUM CHLORIDE 0.9 % IV BOLUS (SEPSIS)
1000.0000 mL | Freq: Once | INTRAVENOUS | Status: AC
Start: 2014-07-06 — End: 2014-07-06
  Administered 2014-07-06: 1000 mL via INTRAVENOUS

## 2014-07-06 MED ORDER — METFORMIN HCL 1000 MG PO TABS: 1000.0000 mg | ORAL_TABLET | Freq: Two times a day (BID) | ORAL | Status: DC

## 2014-07-06 MED ORDER — SODIUM CHLORIDE 0.9 % IV BOLUS (SEPSIS)
1000.0000 mL | Freq: Once | INTRAVENOUS | Status: AC
  Administered 2014-07-06: 1000 mL via INTRAVENOUS

## 2014-07-06 NOTE — ED Provider Notes (Signed)
CSN: [id number]     Arrival date & time 07/06/14  1216 History  This chart was scribed for Clayton Gaskinsonald W Giselle Brutus, MD by Ronney LionSuzanne Le, ED Scribe. This patient was seen in room APA06/APA06 and the patient's care was started at 1:35 PM.    Chief Complaint  Patient presents with  . Hyperglycemia   Patient is a 53 y.o. male presenting with hyperglycemia. The history is provided by the patient. No language interpreter was used.  Hyperglycemia Blood sugar level PTA:  >400 Severity:  Mild Onset quality:  Gradual Duration:  6 hours Timing:  Constant Diabetes status:  Controlled with oral medications Time since last antidiabetic medication:  2 weeks Context: change in medication   Context comment:  His Metformin prescription had run out Relieved by:  None tried Ineffective treatments:  None tried Associated symptoms: dehydration, fatigue and polyuria   Associated symptoms: no chest pain, no fever and no vomiting      HPI Comments: Tawni LevyRichard W Stcyr is a 53 y.o. male who presents to the Emergency Department complaining of hyperglycemia, as his CBG was greater than 400 this morning. Patient complains of associated dry mouth, fatigue, and urinary frequency. Patient ran out of Metformin 1000 mg, his only medication for diabetes, 1.5 weeks ago. [facility] said that he could not be seen until 08/17/14. Patient denies fever, vomiting, chest pain, SOB, headache, or any new BLE pain.   Past Medical History  Diagnosis Date  . Anxiety   . Hypertension   . Diabetes mellitus   . Chronic back pain   . Chronic neck pain   . Coronary artery disease     a. 2000: s/p PCI/stenting of prox LAD and RCA @ [facility];  b. 09/2012 Cath/PCI: LM 30d, LAD 40p ISR, 1886m, 4387m/d (2.25x20 Promus Premier DES), LCX 30p, RCA 80p (3.0x24 Promus Premier DES), patent mid stent, 5786m, PDA 2231m (2.75x28 Promus Premier DES), EF 60-65%.  . Hyperlipidemia   . Tobacco abuse    Past Surgical History  Procedure Laterality Date   . Back surgery    . Appendectomy    . Tonsillectomy    . Cardiac stents    . Left heart catheterization with coronary angiogram N/A 10/11/2012    Procedure: LEFT HEART CATHETERIZATION WITH CORONARY ANGIOGRAM;  Surgeon: Laurey Moralealton S McLean, MD;  [facility];  Service: Cardiovascular;  Laterality: N/A;  . Percutaneous coronary stent intervention (pci-s) N/A 10/14/2012    Procedure: PERCUTANEOUS CORONARY STENT INTERVENTION (PCI-S);  Surgeon: Tonny BollmanMichael Cooper, MD;  [facility];  Service: Cardiovascular;  Laterality: N/A;   Family History  Problem Relation Age of Onset  . Hypertension Father    History  Substance Use Topics  . Smoking status: Current Every Day Smoker -- 0.75 packs/day    Types: Cigarettes  . Smokeless tobacco: Former NeurosurgeonUser  . Alcohol Use: No    Review of Systems  Constitutional: Positive for fatigue. Negative for fever.  Cardiovascular: Negative for chest pain.  Gastrointestinal: Negative for vomiting.  Endocrine: Positive for polyuria.  Neurological: Negative for headaches.  All other systems reviewed and are negative.     Allergies  Review of patient's allergies indicates no known allergies.  Home Medications   Prior to Admission medications   Medication Sig Start Date End Date Taking? Authorizing Provider  acetaminophen (TYLENOL) 500 MG tablet Take 1,000 mg by mouth daily as needed for pain.     Historical Provider, MD  aspirin EC 81 MG  tablet Take 81 mg by mouth every morning.     Historical Provider, MD  atorvastatin (LIPITOR) 40 MG tablet Take 40 mg by mouth daily.    Historical Provider, MD  busPIRone (BUSPAR) 15 MG tablet Take 30 mg by mouth at bedtime.     Historical Provider, MD  clonazePAM (KLONOPIN) 0.5 MG tablet Take 1 tablet (0.5 mg total) by mouth 2 (two) times daily. 11/13/13   Henderson CloudEstela Y Hernandez Acosta, MD  cyclobenzaprine (FLEXERIL) 10 MG tablet Take 10 mg by mouth 3 (three) times daily as needed. Muscle Spasms    Historical  Provider, MD  Docusate Sodium (DSS) 100 MG CAPS Take 100 mg by mouth as needed. 11/13/13   Henderson CloudEstela Y Hernandez Acosta, MD  DULoxetine (CYMBALTA) 30 MG capsule Take 30 mg by mouth daily.    Historical Provider, MD  gabapentin (NEURONTIN) 600 MG tablet Take 600 mg by mouth 4 (four) times daily. 11/13/13   Henderson CloudEstela Y Hernandez Acosta, MD  metFORMIN (GLUCOPHAGE) 1000 MG tablet Take 1 tablet (1,000 mg total) by mouth 2 (two) times daily with a meal. 10/15/12   Ok Anishristopher R Berge, NP  metoprolol tartrate (LOPRESSOR) 25 MG tablet Take 1 tablet (25 mg total) by mouth 2 (two) times daily. 10/13/13   Jodelle GrossKathryn M Lawrence, NP  nitroGLYCERIN (NITROSTAT) 0.4 MG SL tablet Place 1 tablet (0.4 mg total) under the tongue every 5 (five) minutes as needed for chest pain. 01/29/14   Jodelle GrossKathryn M Lawrence, NP  oxyCODONE-acetaminophen (PERCOCET/ROXICET) 5-325 MG per tablet Take 1 tablet by mouth daily as needed. 11/13/13   Henderson CloudEstela Y Hernandez Acosta, MD  oxymorphone (OPANA) 5 MG tablet Take 5 mg by mouth 2 (two) times daily.    Historical Provider, MD  pantoprazole (PROTONIX) 40 MG tablet Take 40 mg by mouth daily.    Historical Provider, MD  quinapril-hydrochlorothiazide (ACCURETIC) 20-25 MG per tablet Take 1 tablet by mouth every morning. 02/23/14   Jodelle GrossKathryn M Lawrence, NP   BP 184/98 mmHg  Pulse 96  Temp(Src) 98.3 F (36.8 C) (Oral)  Resp 18  Ht 6\' 3"  (1.905 m)  Wt 224 lb (101.606 kg)  BMI 28.00 kg/m2  SpO2 100% Physical Exam  Nursing note and vitals reviewed.    CONSTITUTIONAL: Well developed/well nourished HEAD: Normocephalic/atraumatic EYES: EOMI/PERRL ENMT: Mucous membranes moist NECK: supple no meningeal signs SPINE/BACK:entire spine nontender CV: S1/S2 noted, no murmurs/rubs/gallops noted LUNGS: Lungs are clear to auscultation bilaterally, no apparent distress ABDOMEN: soft, nontender, no rebound or guarding, bowel sounds noted throughout abdomen GU:no cva tenderness NEURO: Pt is awake/alert/appropriate, moves  all extremitiesx4.  No facial droop.   EXTREMITIES: pulses normal/equal, full ROM SKIN: warm, color normal PSYCH: no abnormalities of mood noted, alert and oriented to situation   ED Course  Procedures )  DIAGNOSTIC STUDIES: Oxygen Saturation is 100% on room air, normal by my interpretation.    COORDINATION OF CARE: 1:40 PM - Discussed treatment plan with pt at bedside which includes labs for kidney function, IV fluids, and Metformin prescription and pt agreed to plan.    Pt improved Glucose improved He is in no distress He is appropriate for outpatient management Pt given Rx for metformin BP 143/88 mmHg  Pulse 75  Temp(Src) 98.3 F (36.8 C) (Oral)  Resp 18  Ht 6\' 3"  (1.905 m)  Wt 224 lb (101.606 kg)  BMI 28.00 kg/m2  SpO2 97%   Medications  sodium chloride 0.9 % bolus 1,000 mL (1,000 mLs Intravenous New Bag/Given 07/06/14 1333)  sodium chloride 0.9 % bolus 1,000 mL (1,000 mLs Intravenous New Bag/Given 07/06/14 1350)    Labs Review Labs Reviewed  BASIC METABOLIC PANEL - Abnormal; Notable for the following:    Sodium 133 (*)    Chloride 94 (*)    Glucose, Bld 456 (*)    All other components within normal limits  CBC WITH DIFFERENTIAL - Abnormal; Notable for the following:    MCHC 36.6 (*)    All other components within normal limits  CBG MONITORING, ED - Abnormal; Notable for the following:    Glucose-Capillary 528 (*)    All other components within normal limits  CBG MONITORING, ED - Abnormal; Notable for the following:    Glucose-Capillary 322 (*)    All other components within normal limits     EKG Interpretation   Date/Time:  Monday July 06 2014 13:32:08 EST Ventricular Rate:  84 PR Interval:  154 QRS Duration: 97 QT Interval:  360 QTC Calculation: 425 R Axis:   -12 Text Interpretation:  Sinus rhythm Consider left atrial enlargement No  significant change since last tracing Confirmed by Bebe Shaggy  MD, Dorinda Hill  [phone]) on 07/06/2014 1:40:42 PM       MDM   Final diagnoses:  Hyperglycemia  Dehydration     Nursing notes including past medical history and social history reviewed and considered in documentation Labs/vital reviewed myself and considered during evaluation   I personally performed the services described in this documentation, which was scribed in my presence. The recorded information has been reviewed and is accurate.     Clayton Gaskins, MD 07/06/14 [phone]

## 2014-07-06 NOTE — ED Notes (Signed)
Pt reports CBG greater than 400 this am.

## 2014-09-26 ENCOUNTER — Encounter (HOSPITAL_COMMUNITY): Payer: Self-pay | Admitting: *Deleted

## 2014-09-26 ENCOUNTER — Encounter (HOSPITAL_COMMUNITY)
Admission: EM | Disposition: A | Discharge status: Home / Self Care | Payer: Self-pay | Source: Home / Self Care | Attending: Interventional Cardiology

## 2014-09-26 ENCOUNTER — Emergency Department (HOSPITAL_COMMUNITY): Payer: Medicaid Other

## 2014-09-26 ENCOUNTER — Inpatient Hospital Stay (HOSPITAL_COMMUNITY)
Admission: EM | Admit: 2014-09-26 | Discharge: 2014-09-27 | DRG: 247 | Disposition: A | Discharge status: Home / Self Care | Payer: Medicaid Other | Attending: Interventional Cardiology | Admitting: Interventional Cardiology

## 2014-09-26 DIAGNOSIS — F17210 Nicotine dependence, cigarettes, uncomplicated: Secondary | ICD-10-CM | POA: Diagnosis present

## 2014-09-26 DIAGNOSIS — K219 Gastro-esophageal reflux disease without esophagitis: Secondary | ICD-10-CM | POA: Diagnosis present

## 2014-09-26 DIAGNOSIS — R079 Chest pain, unspecified: Secondary | ICD-10-CM

## 2014-09-26 DIAGNOSIS — T82857A Stenosis of cardiac prosthetic devices, implants and grafts, initial encounter: Secondary | ICD-10-CM | POA: Diagnosis present

## 2014-09-26 DIAGNOSIS — I214 Non-ST elevation (NSTEMI) myocardial infarction: Secondary | ICD-10-CM | POA: Diagnosis present

## 2014-09-26 DIAGNOSIS — E785 Hyperlipidemia, unspecified: Secondary | ICD-10-CM | POA: Diagnosis present

## 2014-09-26 DIAGNOSIS — E119 Type 2 diabetes mellitus without complications: Secondary | ICD-10-CM | POA: Diagnosis present

## 2014-09-26 DIAGNOSIS — I10 Essential (primary) hypertension: Secondary | ICD-10-CM | POA: Diagnosis present

## 2014-09-26 DIAGNOSIS — I25119 Atherosclerotic heart disease of native coronary artery with unspecified angina pectoris: Secondary | ICD-10-CM | POA: Diagnosis present

## 2014-09-26 DIAGNOSIS — I252 Old myocardial infarction: Secondary | ICD-10-CM

## 2014-09-26 DIAGNOSIS — Y848 Other medical procedures as the cause of abnormal reaction of the patient, or of later complication, without mention of misadventure at the time of the procedure: Secondary | ICD-10-CM | POA: Diagnosis present

## 2014-09-26 DIAGNOSIS — E7800 Pure hypercholesterolemia, unspecified: Secondary | ICD-10-CM | POA: Diagnosis present

## 2014-09-26 DIAGNOSIS — I2510 Atherosclerotic heart disease of native coronary artery without angina pectoris: Secondary | ICD-10-CM | POA: Diagnosis present

## 2014-09-26 DIAGNOSIS — Z720 Tobacco use: Secondary | ICD-10-CM | POA: Diagnosis present

## 2014-09-26 HISTORY — PX: PERCUTANEOUS CORONARY STENT INTERVENTION (PCI-S): SHX5485

## 2014-09-26 HISTORY — PX: LEFT HEART CATHETERIZATION WITH CORONARY ANGIOGRAM: SHX5451

## 2014-09-26 LAB — CK TOTAL AND CKMB (NOT AT ARMC)
CK TOTAL: 107 U/L (ref 7–232)
CK, MB: 2.3 ng/mL (ref 0.3–4.0)
Relative Index: 2.1 (ref 0.0–2.5)

## 2014-09-26 LAB — COMPREHENSIVE METABOLIC PANEL
ALT: 40 U/L (ref 0–53)
AST: 35 U/L (ref 0–37)
Albumin: 4.4 g/dL (ref 3.5–5.2)
Alkaline Phosphatase: 80 U/L (ref 39–117)
Anion gap: 15 (ref 5–15)
BILIRUBIN TOTAL: 1.1 mg/dL (ref 0.3–1.2)
BUN: <5 mg/dL — ABNORMAL LOW (ref 6–23)
CALCIUM: 9.1 mg/dL (ref 8.4–10.5)
CO2: 23 mmol/L (ref 19–32)
CREATININE: 0.8 mg/dL (ref 0.50–1.35)
Chloride: 94 mmol/L — ABNORMAL LOW (ref 96–112)
GFR calc non Af Amer: >90 mL/min (ref >90)
Glucose, Bld: 154 mg/dL — ABNORMAL HIGH (ref 70–99)
POTASSIUM: 3.5 mmol/L (ref 3.5–5.1)
SODIUM: 132 mmol/L — AB (ref 135–145)
Total Protein: 7.8 g/dL (ref 6.0–8.3)

## 2014-09-26 LAB — CBC
HCT: 48.7 % (ref 39.0–52.0)
Hemoglobin: 18 g/dL — ABNORMAL HIGH (ref 13.0–17.0)
MCH: 34.4 pg — AB (ref 26.0–34.0)
MCHC: 37 g/dL — AB (ref 30.0–36.0)
MCV: 93.1 fL (ref 78.0–100.0)
Platelets: 221 10*3/uL (ref 150–400)
RBC: 5.23 MIL/uL (ref 4.22–5.81)
RDW: 12.5 % (ref 11.5–15.5)
WBC: 9.3 10*3/uL (ref 4.0–10.5)

## 2014-09-26 LAB — TROPONIN I: TROPONIN I: 0.18 ng/mL — AB (ref <0.031)

## 2014-09-26 LAB — MRSA PCR SCREENING: MRSA BY PCR: NEGATIVE

## 2014-09-26 SURGERY — LEFT HEART CATHETERIZATION WITH CORONARY ANGIOGRAM
Anesthesia: LOCAL

## 2014-09-26 MED ORDER — BUSPIRONE HCL 15 MG PO TABS
30.0000 mg | ORAL_TABLET | Freq: Every day | ORAL | Status: DC
  Administered 2014-09-26: 30 mg via ORAL
  Filled 2014-09-26 (×2): qty 2

## 2014-09-26 MED ORDER — MORPHINE SULFATE 4 MG/ML IJ SOLN
4.0000 mg | Freq: Once | INTRAMUSCULAR | Status: AC
  Administered 2014-09-26: 4 mg via INTRAVENOUS
  Filled 2014-09-26: qty 1

## 2014-09-26 MED ORDER — NITROGLYCERIN 0.4 MG SL SUBL: 0.4000 mg | SUBLINGUAL_TABLET | SUBLINGUAL | Status: DC | PRN

## 2014-09-26 MED ORDER — DULOXETINE HCL 30 MG PO CPEP
30.0000 mg | ORAL_CAPSULE | Freq: Every day | ORAL | Status: DC
  Administered 2014-09-26 – 2014-09-27 (×2): 30 mg via ORAL
  Filled 2014-09-26 (×2): qty 1

## 2014-09-26 MED ORDER — HYDROCHLOROTHIAZIDE 25 MG PO TABS
25.0000 mg | ORAL_TABLET | Freq: Every day | ORAL | Status: DC
  Administered 2014-09-27: 25 mg via ORAL
  Filled 2014-09-26: qty 1

## 2014-09-26 MED ORDER — ONDANSETRON HCL 4 MG/2ML IJ SOLN
4.0000 mg | INTRAMUSCULAR | Status: AC
  Administered 2014-09-26: 4 mg via INTRAVENOUS
  Filled 2014-09-26: qty 2

## 2014-09-26 MED ORDER — NITROGLYCERIN 1 MG/10 ML FOR IR/CATH LAB
INTRA_ARTERIAL | Status: AC
  Filled 2014-09-26: qty 10

## 2014-09-26 MED ORDER — TIROFIBAN HCL IV 12.5 MG/250 ML
INTRAVENOUS | Status: AC
  Filled 2014-09-26: qty 250

## 2014-09-26 MED ORDER — ACETAMINOPHEN 325 MG PO TABS
650.0000 mg | ORAL_TABLET | ORAL | Status: DC | PRN
  Administered 2014-09-27: 650 mg via ORAL
  Filled 2014-09-26: qty 2

## 2014-09-26 MED ORDER — LIDOCAINE HCL (PF) 1 % IJ SOLN
INTRAMUSCULAR | Status: AC
  Filled 2014-09-26: qty 30

## 2014-09-26 MED ORDER — CYCLOBENZAPRINE HCL 10 MG PO TABS
10.0000 mg | ORAL_TABLET | Freq: Three times a day (TID) | ORAL | Status: DC | PRN
  Administered 2014-09-27: 10 mg via ORAL
  Filled 2014-09-26: qty 1

## 2014-09-26 MED ORDER — HEPARIN SODIUM (PORCINE) 5000 UNIT/ML IJ SOLN
4000.0000 [IU] | Freq: Once | INTRAMUSCULAR | Status: AC
  Administered 2014-09-26: 4000 [IU] via INTRAVENOUS
  Filled 2014-09-26: qty 1

## 2014-09-26 MED ORDER — TICAGRELOR 90 MG PO TABS
ORAL_TABLET | ORAL | Status: AC
  Filled 2014-09-26: qty 2

## 2014-09-26 MED ORDER — METFORMIN HCL 500 MG PO TABS: 1000.0000 mg | ORAL_TABLET | Freq: Two times a day (BID) | ORAL | Status: DC

## 2014-09-26 MED ORDER — LISINOPRIL 20 MG PO TABS
20.0000 mg | ORAL_TABLET | Freq: Every day | ORAL | Status: DC
  Administered 2014-09-26 – 2014-09-27 (×2): 20 mg via ORAL
  Filled 2014-09-26 (×2): qty 1

## 2014-09-26 MED ORDER — LORAZEPAM 2 MG/ML IJ SOLN
1.0000 mg | Freq: Once | INTRAMUSCULAR | Status: AC
  Administered 2014-09-26: 1 mg via INTRAVENOUS
  Filled 2014-09-26: qty 1

## 2014-09-26 MED ORDER — PANTOPRAZOLE SODIUM 40 MG PO TBEC
40.0000 mg | DELAYED_RELEASE_TABLET | Freq: Every day | ORAL | Status: DC
  Administered 2014-09-27: 40 mg via ORAL
  Filled 2014-09-26: qty 1

## 2014-09-26 MED ORDER — MIDAZOLAM HCL 2 MG/2ML IJ SOLN
INTRAMUSCULAR | Status: AC
  Filled 2014-09-26: qty 2

## 2014-09-26 MED ORDER — ONDANSETRON HCL 4 MG/2ML IJ SOLN: 4.0000 mg | Freq: Four times a day (QID) | INTRAMUSCULAR | Status: DC | PRN

## 2014-09-26 MED ORDER — PRAVASTATIN SODIUM 40 MG PO TABS
40.0000 mg | ORAL_TABLET | Freq: Every day | ORAL | Status: DC
  Administered 2014-09-26: 40 mg via ORAL
  Filled 2014-09-26 (×2): qty 1

## 2014-09-26 MED ORDER — VERAPAMIL HCL 2.5 MG/ML IV SOLN
INTRAVENOUS | Status: AC
  Filled 2014-09-26: qty 2

## 2014-09-26 MED ORDER — GABAPENTIN 600 MG PO TABS
600.0000 mg | ORAL_TABLET | Freq: Three times a day (TID) | ORAL | Status: DC
  Administered 2014-09-26 – 2014-09-27 (×3): 600 mg via ORAL
  Filled 2014-09-26 (×6): qty 1

## 2014-09-26 MED ORDER — HEPARIN (PORCINE) IN NACL 100-0.45 UNIT/ML-% IJ SOLN
1400.0000 [IU]/h | INTRAMUSCULAR | Status: DC
  Administered 2014-09-26: 1400 [IU]/h via INTRAVENOUS
  Filled 2014-09-26 (×2): qty 250

## 2014-09-26 MED ORDER — FENTANYL CITRATE 0.05 MG/ML IJ SOLN
INTRAMUSCULAR | Status: AC
  Filled 2014-09-26: qty 2

## 2014-09-26 MED ORDER — HEPARIN SODIUM (PORCINE) 5000 UNIT/ML IJ SOLN
5000.0000 [IU] | Freq: Three times a day (TID) | INTRAMUSCULAR | Status: DC
  Administered 2014-09-27 (×2): 5000 [IU] via SUBCUTANEOUS
  Filled 2014-09-26 (×4): qty 1

## 2014-09-26 MED ORDER — QUINAPRIL-HYDROCHLOROTHIAZIDE 20-25 MG PO TABS
1.0000 | ORAL_TABLET | Freq: Every morning | ORAL | Status: DC
Start: 2014-09-27 — End: 2014-09-26

## 2014-09-26 MED ORDER — CLONAZEPAM 0.5 MG PO TABS
0.5000 mg | ORAL_TABLET | Freq: Two times a day (BID) | ORAL | Status: DC
  Administered 2014-09-26 – 2014-09-27 (×2): 0.5 mg via ORAL
  Filled 2014-09-26 (×2): qty 1

## 2014-09-26 MED ORDER — HEPARIN SODIUM (PORCINE) 1000 UNIT/ML IJ SOLN
INTRAMUSCULAR | Status: AC
  Filled 2014-09-26: qty 1

## 2014-09-26 MED ORDER — HEPARIN (PORCINE) IN NACL 2-0.9 UNIT/ML-% IJ SOLN
INTRAMUSCULAR | Status: AC
  Filled 2014-09-26: qty 1500

## 2014-09-26 MED ORDER — METOPROLOL TARTRATE 25 MG PO TABS
25.0000 mg | ORAL_TABLET | Freq: Two times a day (BID) | ORAL | Status: DC
  Administered 2014-09-26 – 2014-09-27 (×2): 25 mg via ORAL
  Filled 2014-09-26 (×3): qty 1

## 2014-09-26 MED ORDER — SODIUM CHLORIDE 0.9 % IV SOLN
1.0000 mL/kg/h | INTRAVENOUS | Status: AC
  Administered 2014-09-26: 1 mL/kg/h via INTRAVENOUS

## 2014-09-26 MED ORDER — ACETAMINOPHEN 500 MG PO TABS: 1000.0000 mg | ORAL_TABLET | Freq: Every day | ORAL | Status: DC | PRN

## 2014-09-26 MED ORDER — NITROGLYCERIN IN D5W 200-5 MCG/ML-% IV SOLN
5.0000 ug/min | Freq: Once | INTRAVENOUS | Status: AC
  Administered 2014-09-26: 5 ug/min via INTRAVENOUS
  Filled 2014-09-26: qty 250

## 2014-09-26 MED ORDER — DOCUSATE SODIUM 100 MG PO CAPS
100.0000 mg | ORAL_CAPSULE | Freq: Every day | ORAL | Status: DC | PRN
  Filled 2014-09-26: qty 1

## 2014-09-26 MED ORDER — TICAGRELOR 90 MG PO TABS
90.0000 mg | ORAL_TABLET | Freq: Two times a day (BID) | ORAL | Status: DC
  Administered 2014-09-27: 90 mg via ORAL
  Filled 2014-09-26 (×2): qty 1

## 2014-09-26 MED ORDER — GLIPIZIDE 10 MG PO TABS
10.0000 mg | ORAL_TABLET | Freq: Every day | ORAL | Status: DC
  Administered 2014-09-27: 10 mg via ORAL
  Filled 2014-09-26 (×2): qty 1

## 2014-09-26 MED ORDER — ATORVASTATIN CALCIUM 40 MG PO TABS: 40.0000 mg | ORAL_TABLET | Freq: Every day | ORAL | Status: DC

## 2014-09-26 MED ORDER — ASPIRIN EC 81 MG PO TBEC
81.0000 mg | DELAYED_RELEASE_TABLET | Freq: Every morning | ORAL | Status: DC
  Administered 2014-09-27: 81 mg via ORAL
  Filled 2014-09-26: qty 1

## 2014-09-26 MED ORDER — ASPIRIN 81 MG PO CHEW
81.0000 mg | CHEWABLE_TABLET | Freq: Every day | ORAL | Status: DC
Start: 2014-09-26 — End: 2014-09-26

## 2014-09-26 NOTE — ED Provider Notes (Signed)
CSN: [id number]     Arrival date & time 09/26/14  1121 History   First MD Initiated Contact with Patient 09/26/14 1129     Chief Complaint  Patient presents with  . Chest Pain   (Consider location/radiation/quality/duration/timing/severity/associated sxs/prior Treatment) HPI  Clayton Foster is a 54 yo L presenting with chest pain. He states this chest pain started 2 days ago while he was sitting on the couch. He describes the pain as a constant aching, burning pain is in the center of his chest. He reports it sometimes radiates to the left side of his chest but is always in the center of his chest. He rates the pain currently is about a 5/10 currently and reports receiving morphine, nitroglycerin, aspirin from EMS. He denies any shortness of breath, diaphoresis, nausea, recent illness or injury.  Past Medical History  Diagnosis Date  . Anxiety   . Hypertension   . Diabetes mellitus   . Chronic back pain   . Chronic neck pain   . Coronary artery disease     a. 2000: s/p PCI/stenting of prox LAD and RCA @ [facility];  b. 09/2012 Cath/PCI: LM 30d, LAD 40p ISR, 80m, 64m/d (2.25x20 Promus Premier DES), LCX 30p, RCA 80p (3.0x24 Promus Premier DES), patent mid stent, 11m, PDA 18m (2.75x28 Promus Premier DES), EF 60-65%.  . Hyperlipidemia   . Tobacco abuse    Past Surgical History  Procedure Laterality Date  . Back surgery    . Appendectomy    . Tonsillectomy    . Cardiac stents    . Left heart catheterization with coronary angiogram N/A 10/11/2012    Procedure: LEFT HEART CATHETERIZATION WITH CORONARY ANGIOGRAM;  Surgeon: Laurey Morale, MD;  Location: [facility] CATH LAB;  Service: Cardiovascular;  Laterality: N/A;  . Percutaneous coronary stent intervention (pci-s) N/A 10/14/2012    Procedure: PERCUTANEOUS CORONARY STENT INTERVENTION (PCI-S);  Surgeon: Tonny Bollman, MD;  Location: [facility] CATH LAB;  Service: Cardiovascular;  Laterality: N/A;   Family History  Problem Relation Age of Onset  .  Hypertension Father    History  Substance Use Topics  . Smoking status: Current Every Day Smoker -- 0.75 packs/day    Types: Cigarettes  . Smokeless tobacco: Former Neurosurgeon  . Alcohol Use: No    Review of Systems  Constitutional: Negative for fever and chills.  HENT: Negative for sore throat.   Eyes: Negative for visual disturbance.  Respiratory: Negative for cough and shortness of breath.   Cardiovascular: Positive for chest pain. Negative for leg swelling.  Gastrointestinal: Negative for nausea, vomiting and diarrhea.  Genitourinary: Negative for dysuria.  Musculoskeletal: Negative for myalgias.  Skin: Negative for rash.  Neurological: Negative for weakness, numbness and headaches.      Allergies  Review of patient's allergies indicates no known allergies.  Home Medications   Prior to Admission medications   Medication Sig Start Date End Date Taking? Authorizing Provider  acetaminophen (TYLENOL) 500 MG tablet Take 1,000 mg by mouth daily as needed for pain.     Historical Provider, MD  aspirin EC 81 MG tablet Take 81 mg by mouth every morning.     Historical Provider, MD  atorvastatin (LIPITOR) 40 MG tablet Take 40 mg by mouth daily.    Historical Provider, MD  busPIRone (BUSPAR) 15 MG tablet Take 30 mg by mouth at bedtime.     Historical Provider, MD  clonazePAM (KLONOPIN) 0.5 MG tablet Take 1 tablet (0.5 mg total) by mouth 2 (two) times  daily. Patient not taking: Reported on 07/06/2014 11/13/13   Henderson Cloud, MD  cyclobenzaprine (FLEXERIL) 10 MG tablet Take 10 mg by mouth 3 (three) times daily as needed. Muscle Spasms    Historical Provider, MD  Docusate Sodium (DSS) 100 MG CAPS Take 100 mg by mouth as needed (stool softener).  11/13/13   Henderson Cloud, MD  DULoxetine (CYMBALTA) 30 MG capsule Take 30 mg by mouth daily.    Historical Provider, MD  gabapentin (NEURONTIN) 600 MG tablet Take 600 mg by mouth 4 (four) times daily. 11/13/13   Henderson Cloud, MD  metFORMIN (GLUCOPHAGE) 1000 MG tablet Take 1 tablet (1,000 mg total) by mouth 2 (two) times daily. 07/06/14   Joya Gaskins, MD  metoprolol tartrate (LOPRESSOR) 25 MG tablet Take 1 tablet (25 mg total) by mouth 2 (two) times daily. 10/13/13   Jodelle Gross, NP  nitroGLYCERIN (NITROSTAT) 0.4 MG SL tablet Place 1 tablet (0.4 mg total) under the tongue every 5 (five) minutes as needed for chest pain. 01/29/14   Jodelle Gross, NP  pantoprazole (PROTONIX) 40 MG tablet Take 40 mg by mouth daily.    Historical Provider, MD  quinapril-hydrochlorothiazide (ACCURETIC) 20-25 MG per tablet Take 1 tablet by mouth every morning. 02/23/14   Jodelle Gross, NP   BP 152/86 mmHg  Pulse 88  Temp(Src) 97.9 F (36.6 C) (Oral)  Resp 19  Ht  (1.905 m)  Wt 234 lb (106.142 kg)  BMI 29.25 kg/m2  SpO2 94% Physical Exam  Constitutional: He is oriented to person, place, and time. He appears well-developed and well-nourished. No distress.  HENT:  Head: Normocephalic and atraumatic.  Mouth/Throat: Oropharynx is clear and moist.  Eyes: Conjunctivae are normal.  Neck: Neck supple.  Cardiovascular: Normal rate, regular rhythm and intact distal pulses.  Exam reveals no gallop and no friction rub.   No murmur heard. Pulmonary/Chest: Effort normal and breath sounds normal. No respiratory distress. He has no wheezes. He has no rales. He exhibits no tenderness.  Abdominal: Soft. There is no tenderness.  Musculoskeletal: He exhibits no tenderness.  Lymphadenopathy:    He has no cervical adenopathy.  Neurological: He is alert and oriented to person, place, and time.  Skin: Skin is warm and dry. No rash noted. He is not diaphoretic.  Psychiatric: He has a normal mood and affect.  Nursing note and vitals reviewed.   ED Course  Procedures (including critical care time) Labs Review Labs Reviewed  CBC - Abnormal; Notable for the following:    Hemoglobin 18.0 (*)    MCH 34.4 (*)     MCHC 37.0 (*)    All other components within normal limits  TROPONIN I - Abnormal; Notable for the following:    Troponin I 0.18 (*)    All other components within normal limits  COMPREHENSIVE METABOLIC PANEL - Abnormal; Notable for the following:    Sodium 132 (*)    Chloride 94 (*)    Glucose, Bld 154 (*)    BUN <5 (*)    All other components within normal limits  HEPARIN LEVEL (UNFRACTIONATED)    Imaging Review Dg Chest 2 View  09/26/2014   CLINICAL DATA:  Midsternal chest pain for 2 days. Hypertension. Diabetes. Smoker. Prior cardiac stents.  EXAM: CHEST  2 VIEW  COMPARISON:  11/12/2013  FINDINGS: Midline trachea. Normal heart size and mediastinal contours. No pleural effusion or pneumothorax. Diffuse peribronchial thickening. Clear lungs.  IMPRESSION: 1.  No acute cardiopulmonary disease. 2. Mild peribronchial thickening which may relate to chronic bronchitis or smoking.   Electronically Signed   By: Jeronimo GreavesKyle  Talbot M.D.   On: 09/26/2014 13:47     EKG Interpretation   Date/Time:  Saturday September 26 2014 11:29:13 EST Ventricular Rate:  89 PR Interval:  144 QRS Duration: 102 QT Interval:  463 QTC Calculation: 563 R Axis:   -73 Text Interpretation:  Sinus rhythm Left ventricular hypertrophy Inferior  infarct, age indeterminate Slow R wave progression, unchanged.  Prolonged  QT interval Confirmed by Fayrene FearingJAMES  MD, MARK ([id number]) on 09/26/2014 11:39:31 AM      MDM   Final diagnoses:  Chest pain  Non Q wave myocardial infarction   54 yo with 2 days of central chest pain concerning for cardiac etiology of chest pain. EKG shows new LVH and T-wave inversions and initial troponin is elevated. This case was discussed with Dr. Fayrene FearingJames. Pt has been re-evaluated prior to consult and VSS, NAD, heart RRR, pain 6/10, lungs CTAB.  Cardiology has been consulted and is taking pt to cath lab. The patient appears reasonably stabilized for admission considering the current resources, flow, and  capabilities available in the ED at this time, and I doubt any further screening and/or treatment in the ED prior to admission.   Filed Vitals:   09/26/14 1515 09/26/14 1525 09/26/14 1530 09/26/14 1545  BP: 124/80 124/80 117/77 122/74  Pulse: 88 86 84 88  Temp:  98.5 F (36.9 C)    TempSrc:  Oral    Resp: 16 15 16 15   Height:      Weight:      SpO2: 93% 94% 94% 94%   Meds given in ED:  Medications  heparin ADULT infusion 100 units/mL (25000 units/250 mL) (1,400 Units/hr Intravenous New Bag/Given 09/26/14 1501)  morphine 4 MG/ML injection 4 mg (4 mg Intravenous Given 09/26/14 1205)  ondansetron (ZOFRAN) injection 4 mg (4 mg Intravenous Given 09/26/14 1205)  nitroGLYCERIN 50 mg in dextrose 5 % 250 mL (0.2 mg/mL) infusion (10 mcg/min Intravenous Rate/Dose Change 09/26/14 1601)  heparin injection 4,000 Units (4,000 Units Intravenous Given 09/26/14 1400)  morphine 4 MG/ML injection 4 mg (4 mg Intravenous Given 09/26/14 1501)  LORazepam (ATIVAN) injection 1 mg (1 mg Intravenous Given 09/26/14 1630)    New Prescriptions   No medications on file       Harle BattiestElizabeth Lynee Rosenbach, NP 09/26/14 1648  Rolland PorterMark James, MD 10/04/14 [phone]

## 2014-09-26 NOTE — Progress Notes (Signed)
ANTICOAGULATION CONSULT NOTE - Initial Consult  Pharmacy Consult for Heparin  Indication: chest pain/ACS  No Known Allergies  Patient Measurements: Height: 6\' 3"  (190.5 cm) Weight: 234 lb (106.142 kg) IBW/kg (Calculated) : 84.5  Vital Signs: Temp: 97.6 F (36.4 C) (03/05 1312) Temp Source: Oral (03/05 1312) BP: 124/77 mmHg (03/05 1330) Pulse Rate: 72 (03/05 1330)  Labs:  Recent Labs  09/26/14 1145  HGB 18.0*  HCT 48.7  PLT 221  CREATININE 0.80  TROPONINI 0.18*    Estimated Creatinine Clearance: 142.2 mL/min (by C-G formula based on Cr of 0.8).   Medical History: Past Medical History  Diagnosis Date  . Anxiety   . Hypertension   . Diabetes mellitus   . Chronic back pain   . Chronic neck pain   . Coronary artery disease     a. 2000: s/p PCI/stenting of prox LAD and RCA @ [facility];  b. 09/2012 Cath/PCI: LM 30d, LAD 40p ISR, 5616m, 2427m/d (2.25x20 Promus Premier DES), LCX 30p, RCA 80p (3.0x24 Promus Premier DES), patent mid stent, 7216m, PDA 3192m (2.75x28 Promus Premier DES), EF 60-65%.  . Hyperlipidemia   . Tobacco abuse     Medications:   (Not in a hospital admission)  Assessment: [age] YOM who presented with chest pain. Pharmacy consulted to start IV heparin. Initial troponin elevated. Hgb elevated at 18 but plt wnl. He was not on any anticoagulants prior to admission. Patient scheduled to get 4000 units bolus in the ED per MD   Goal of Therapy:  Heparin level 0.3-0.7 units/ml Monitor platelets by anticoagulation protocol: Yes   Plan:  -Start heparin infusion at 1400 units/hr -F/u 6 hr HL -Monitor daily HL, CBC and s/s of bleeding   Vinnie LevelBenjamin Ry Moody, PharmD., BCPS Clinical Pharmacist Pager [phone]

## 2014-09-26 NOTE — H&P (Signed)
Primary cardiologist: Dr Burt Knack  HPI: 54 year old male past medical history of coronary artery disease, diabetes mellitus, hypertension, hyperlipidemia with non-ST elevation myocardial infarction. The patient has had previous PCI of his LAD and right coronary artery in 2000. He had repeat PCI of his LAD and also right coronary artery in 2014. Ejection fraction at that time normal. Echocardiogram in April 2015 showed normal LV function. Nuclear study in April 2014 showed normal LV function, diaphragmatic attenuation but no ischemia. Patient states that he has had intermittent chest pain over the past 72 hours. The pain is substernal and described as burning. It is not pleuritic, positional or related to food. No associated symptoms. No radiation. His pain returned today and it persists at the time of this evaluation.   (Not in a hospital admission)  No Known Allergies   Past Medical History  Diagnosis Date  . Anxiety   . Hypertension   . Diabetes mellitus   . Chronic back pain   . Chronic neck pain   . Coronary artery disease     a. 2000: s/p PCI/stenting of prox LAD and RCA @ [facility];  b. 09/2012 Cath/PCI: LM 30d, LAD 40p ISR, 75m 985m (2.25x20 Promus Premier DES), LCX 30p, RCA 80p (3.0x24 Promus Premier DES), patent mid stent, 4033mDA 81m36m75x28 Promus Premier DES), EF 60-65%.  . Hyperlipidemia   . Tobacco abuse     Past Surgical History  Procedure Laterality Date  . Back surgery    . Appendectomy    . Tonsillectomy    . Cardiac stents    . Left heart catheterization with coronary angiogram N/A 10/11/2012    Procedure: LEFT HEART CATHETERIZATION WITH CORONARY ANGIOGRAM;  Surgeon: DaltLarey Dresser;  [facility];  Service: Cardiovascular;  Laterality: N/A;  . Percutaneous coronary stent intervention (pci-s) N/A 10/14/2012    Procedure: PERCUTANEOUS CORONARY STENT INTERVENTION (PCI-S);  Surgeon: MichSherren Mocha;  [facility];  Service: Cardiovascular;   Laterality: N/A;    History   Social History  . Marital Status: Divorced    Spouse Name: N/A  . Number of Children: 2  . Years of Education: N/A   Occupational History  . Unemployed    Social History Main Topics  . Smoking status: Current Every Day Smoker -- 0.75 packs/day    Types: Cigarettes  . Smokeless tobacco: Former UserSystems developerAlcohol Use: No  . Drug Use: No  . Sexual Activity: No   Other Topics Concern  . Not on file   Social History Narrative    Family History  Problem Relation Age of Onset  . Hypertension Father     ROS:  no fevers or chills, productive cough, hemoptysis, dysphasia, odynophagia, melena, hematochezia, dysuria, hematuria, rash, seizure activity, orthopnea, PND, pedal edema, claudication. Remaining systems are negative.  Physical Exam:   Blood pressure 124/77, pulse 72, temperature 97.6 F (36.4 C), temperature source Oral, resp. rate 15, height '6\' 3"'  (1.905 m), weight 234 lb (106.142 kg), SpO2 96 %.  General:  Well developed/well nourished in NAD Skin warm/dry Patient not depressed No peripheral clubbing Back-normal HEENT-normal/normal eyelids Neck supple/normal carotid upstroke bilaterally; no bruits; no JVD; no thyromegaly chest - CTA/ normal expansion CV - RRR/normal S1 and S2; no rubs or gallops;  PMI nondisplaced, 1/6 systolic murmur left sternal border. Abdomen -NT/ND, no HSM, no mass, + bowel sounds, no bruit 2+ femoral pulses, no bruits Ext-no edema, chords, 2+ DP Neuro-grossly nonfocal  ECG  sinus rhythm, inferior infarct, anterior infarct, marked anterior and inferior T-wave inversion compared to previous.  Results for orders placed or performed during the hospital encounter of 09/26/14 (from the past 48 hour(s))  CBC     Status: Abnormal   Collection Time: 09/26/14 11:45 AM  Result Value Ref Range   WBC 9.3 4.0 - 10.5 K/uL   RBC 5.23 4.22 - 5.81 MIL/uL   Hemoglobin 18.0 (H) 13.0 - 17.0 g/dL   HCT 48.7 39.0 - 52.0 %   MCV  93.1 78.0 - 100.0 fL   MCH 34.4 (H) 26.0 - 34.0 pg   MCHC 37.0 (H) 30.0 - 36.0 g/dL   RDW 12.5 11.5 - 15.5 %   Platelets 221 150 - 400 K/uL  Troponin I (order at [facility])     Status: Abnormal   Collection Time: 09/26/14 11:45 AM  Result Value Ref Range   Troponin I 0.18 (H) <0.031 ng/mL    Comment:        PERSISTENTLY INCREASED TROPONIN VALUES IN THE RANGE OF 0.04-0.49 ng/mL CAN BE SEEN IN:       -UNSTABLE ANGINA       -CONGESTIVE HEART FAILURE       -MYOCARDITIS       -CHEST TRAUMA       -ARRYHTHMIAS       -LATE PRESENTING MYOCARDIAL INFARCTION       -COPD   CLINICAL FOLLOW-UP RECOMMENDED.   Comprehensive metabolic panel     Status: Abnormal   Collection Time: 09/26/14 11:45 AM  Result Value Ref Range   Sodium 132 (L) 135 - 145 mmol/L   Potassium 3.5 3.5 - 5.1 mmol/L   Chloride 94 (L) 96 - 112 mmol/L   CO2 23 19 - 32 mmol/L   Glucose, Bld 154 (H) 70 - 99 mg/dL   BUN <5 (L) 6 - 23 mg/dL   Creatinine, Ser 0.80 0.50 - 1.35 mg/dL   Calcium 9.1 8.4 - 10.5 mg/dL   Total Protein 7.8 6.0 - 8.3 g/dL   Albumin 4.4 3.5 - 5.2 g/dL   AST 35 0 - 37 U/L   ALT 40 0 - 53 U/L   Alkaline Phosphatase 80 39 - 117 U/L   Total Bilirubin 1.1 0.3 - 1.2 mg/dL   GFR calc non Af Amer >90 >90 mL/min   GFR calc Af Amer >90 >90 mL/min    Comment: (NOTE) The eGFR has been calculated using the CKD EPI equation. This calculation has not been validated in all clinical situations. eGFR's persistently <90 mL/min signify possible Chronic Kidney Disease.    Anion gap 15 5 - 15    Dg Chest 2 View  09/26/2014   CLINICAL DATA:  Midsternal chest pain for 2 days. Hypertension. Diabetes. Smoker. Prior cardiac stents.  EXAM: CHEST  2 VIEW  COMPARISON:  11/12/2013  FINDINGS: Midline trachea. Normal heart size and mediastinal contours. No pleural effusion or pneumothorax. Diffuse peribronchial thickening. Clear lungs.  IMPRESSION: 1.  No acute cardiopulmonary disease. 2. Mild peribronchial thickening which may relate  to chronic bronchitis or smoking.   Electronically Signed   By: Abigail Miyamoto M.D.   On: 09/26/2014 13:47    Assessment/Plan 1 non-ST elevation myocardial infarction-the patient presents with concerning chest pain, abnormal troponin and markedly abnormal electrocardiogram. Plan to treat with aspirin, statin, heparin and beta blocker. His pain persists despite nitroglycerin and heparin. I think we should proceed with emergent cardiac catheterization. The risks and benefits were discussed and  he agrees to proceed. 2 hyperlipidemia-continue statin. 3 hypertension-continue present medications. 4 diabetes mellitus-continue preadmission medications but hold Glucophage for 48 hours following procedure. Follow CBG. 5 tobacco abuse-patient counseled on discontinuing. Kirk Ruths MD 09/26/2014, 2:47 PM

## 2014-09-26 NOTE — CV Procedure (Signed)
     PROCEDURE:  Left heart catheterization with selective coronary angiography, left ventriculogram.  PCI LAD with Drug eluting stent; PCI RCA with cutting balloon angioplasty  INDICATIONS:  NSTEMI  The risks, benefits, and details of the procedure were explained to the patient.  The patient verbalized understanding and wanted to proceed.  Informed written consent was obtained.  PROCEDURE TECHNIQUE:  After Xylocaine anesthesia a 5F slender sheath was placed in the right radial artery with a single anterior needle wall stick.   IV Heparin was given.  Right coronary angiography was done using a Judkins R4 guide catheter.  Left coronary angiography was done using a Judkins L3.5 guide catheter.  Left ventriculography was done using a pigtail catheter.  A TR band was used for hemostasis.   CONTRAST:  Total of  125 cc.  COMPLICATIONS:  None.    HEMODYNAMICS:  Aortic pressure was 96/61; LV pressure was 102/0; LVEDP 7.  There was no gradient between the left ventricle and aorta.    ANGIOGRAPHIC DATA:   The left main coronary artery is widely patent.  The left anterior descending artery is a large vessel which has multiple stents. There is mild restenosis in the proximal LAD. In the mid LAD, after a large diagonal, there is a 95% stenosis. There is TIMI-3 flow in the vessel. The LAD wraps around the apex..  The left circumflex artery is a large vessel. There is mild disease proximally. There is a large OM1 which branches across the lateral wall. The OM 2 is medium-sized and widely patent.  The right coronary artery is a large, dominant vessel. There are multiple stents in the RCA. There is an 80% in-stent restenosis lesion just after a large RV marginal branch. The distal RCA is moderately diseased. In the PDA, there is a distal 80% lesion which is unchanged from prior. The posterior lateral artery is patent.  LEFT VENTRICULOGRAM:  Left ventricular angiogram was done in the 30 RAO projection and  revealed normal left ventricular wall motion and systolic function with an estimated ejection fraction of 65%.  LVEDP was 7 mmHg.  PCI NARRATIVE: A CLS 3.5 guiding catheter was used to engage the left main. IV heparin and IV tirofiban were used for anticoagulation. ACT was used to check that the heparin was therapeutic. A BMW wire was placed across the area disease in the LAD. A fielder XT wire was placed into the diagonal vessel which originated just before the lesion.  A 2.0 x 12 balloon was used to predilate. A 2.5 x 12 balloon was used to further predilate with multiple inflations. A 2.5 x 16 Synergy drug-eluting stent was then deployed to overlap with the distal area of stent and still cover the lesion. The stent was postdilated with a 2.75 noncompliant balloon. During this process, the diagonal was rewired through the stent struts. The diagonal maintained TIMI-3 flow. There was an excellent angiographic result with no residual stenosis.  A JR4 guiding catheter was used to engage the RCA. A pro-water wire was placed across the area of disease. A 3.5 x 10 MG a sculpt scoring balloon was then inflated to 12 atm. The stenosis was resolved. There was an excellent angiographic result with no residual stenosis.  IMPRESSIONS:  1. Normal left main coronary artery. 2. Patent stents in the proximal and mid left anterior descending artery with mild in-stent restenosis. To note the lesion in the mid LAD just after a large diagonal branch. This was successfully stented with   a 2.5 x 12 Synergy drug-eluting stent, postdilated to greater than 2.75 mm. 3. Mild disease in the left circumflex artery and its branches. 4. Focal area of restenosis in the mid right coronary artery prior stents.  This was treated with cutting balloon angioplasty with a 3.5 x 10 angina balloon with no residual stenosis. 5. Normal left ventricular systolic function.  LVEDP 7 mmHg.  Ejection fraction 65%.  RECOMMENDATION:  Continue dual  antiplatelet therapy for at least a year. He needs aggressive secondary prevention including smoking cessation. We used Brilinta today. If there are issues with him affording the Brilinta long-term, I would load him with 300 mg of Plavix and then switch him to clopidogrel 75 mg daily.

## 2014-09-26 NOTE — ED Notes (Signed)
Cardiology at bedside. Cards reports he is attempting to get pt to cath lab asap.

## 2014-09-26 NOTE — ED Provider Notes (Addendum)
Pt seen and evaluated.  D/W E. Tysinger NP.  EKG shows increased voltage versus comparison with inverted T waves V3 through V5. Pain is atypical described as a central anterior burning without radiation. Present for 2 days. In the past she is always developed shortness of breath with exertion with his angina.   Had normal Myoview one year ago. Last cath 10/14/2012 with Dr. Excell Seltzerooper with PTCA 3.  Nitroglycerin sublingual 2 and route without relief. Given IV morphine here with some relief.  13:48:  On recheck patient with continued mild discomfort. Troponin noted to be elevated 0.18. Have asked for heparin and nitroglycerin IV. Call placed to cardiology.  Rolland PorterMark Laurelai Lepp, MD 09/26/14 1348  Rolland PorterMark Jerrick Farve, MD 09/30/14 [phone]

## 2014-09-26 NOTE — ED Notes (Addendum)
[facility]. Pt states he has been having substernal cp x 2 days. Pt denies radiation or any other assoc. Sx. Pt bp was 187/105 upon arrival of EMS. Pt reports he has been under a lot of stress lately rt loss of his step brother. Pt has a cardiac hx and has had 6 stents placed. EMS reports the pt is unable to have open heart surgery rt inability to do exercises dt back problems that have caused issues with his leg mobility. Pt received 324 mg of aspirin, 2 of nitro and 4mg  of morphine PTA.

## 2014-09-27 ENCOUNTER — Encounter (HOSPITAL_COMMUNITY): Payer: Self-pay | Admitting: Physician Assistant

## 2014-09-27 LAB — BASIC METABOLIC PANEL
ANION GAP: 11 (ref 5–15)
BUN: 7 mg/dL (ref 6–23)
CALCIUM: 9 mg/dL (ref 8.4–10.5)
CHLORIDE: 101 mmol/L (ref 96–112)
CO2: 22 mmol/L (ref 19–32)
Creatinine, Ser: 0.74 mg/dL (ref 0.50–1.35)
Glucose, Bld: 166 mg/dL — ABNORMAL HIGH (ref 70–99)
Potassium: 3.4 mmol/L — ABNORMAL LOW (ref 3.5–5.1)
SODIUM: 134 mmol/L — AB (ref 135–145)

## 2014-09-27 LAB — CBC
HEMATOCRIT: 46.3 % (ref 39.0–52.0)
HEMOGLOBIN: 17 g/dL (ref 13.0–17.0)
MCH: 34.1 pg — ABNORMAL HIGH (ref 26.0–34.0)
MCHC: 36.7 g/dL — AB (ref 30.0–36.0)
MCV: 92.8 fL (ref 78.0–100.0)
Platelets: 213 10*3/uL (ref 150–400)
RBC: 4.99 MIL/uL (ref 4.22–5.81)
RDW: 12.6 % (ref 11.5–15.5)
WBC: 9.9 10*3/uL (ref 4.0–10.5)

## 2014-09-27 LAB — CK TOTAL AND CKMB (NOT AT ARMC)
CK TOTAL: 87 U/L (ref 7–232)
CK, MB: 2.3 ng/mL (ref 0.3–4.0)
CK, MB: 2.9 ng/mL (ref 0.3–4.0)
Relative Index: INVALID (ref 0.0–2.5)
Relative Index: INVALID (ref 0.0–2.5)
Total CK: 91 U/L (ref 7–232)

## 2014-09-27 MED ORDER — ATORVASTATIN CALCIUM 80 MG PO TABS
80.0000 mg | ORAL_TABLET | Freq: Every day | ORAL | Status: DC
  Filled 2014-09-27: qty 1

## 2014-09-27 MED ORDER — TICAGRELOR 90 MG PO TABS: 90.0000 mg | ORAL_TABLET | Freq: Two times a day (BID) | ORAL | Status: DC

## 2014-09-27 MED ORDER — METOPROLOL TARTRATE 25 MG PO TABS: 25.0000 mg | ORAL_TABLET | Freq: Two times a day (BID) | ORAL | Status: AC

## 2014-09-27 MED ORDER — ATORVASTATIN CALCIUM 80 MG PO TABS
80.0000 mg | ORAL_TABLET | Freq: Every day | ORAL | Status: AC
Start: 2014-09-27 — End: ?

## 2014-09-27 NOTE — Discharge Summary (Signed)
Discharge Summary   Patient ID: Clayton Foster,  MRN: [id number], DOB/AGE: [birth year] 53 y.o.  Admit date: 09/26/2014 Discharge date: 09/27/2014  Primary Care Provider: MUSE,Clayton D. Primary Cardiologist: Dr Clayton Foster (follows in [place])  Discharge Diagnoses Principal Problem:   NSTEMI (non-ST elevated myocardial infarction) Active Problems:   Diabetes mellitus, type 2   Hypertension   High cholesterol   CAD (coronary artery disease)   Tobacco use   GERD (gastroesophageal reflux disease)   Allergies No Known Allergies  Procedures  Cardiac catheterization 09/26/2014 IMPRESSIONS:  1. Normal left main coronary artery. 2. Patent stents in the proximal and mid left anterior descending artery with mild in-stent restenosis. To note the lesion in the mid LAD just after a large diagonal branch. This was successfully stented with a 2.5 x 12 Synergy drug-eluting stent, postdilated to greater than 2.75 mm. 3. Mild disease in the left circumflex artery and its branches. 4. Focal area of restenosis in the mid right coronary artery prior stents. This was treated with cutting balloon angioplasty with a 3.5 x 10 angina balloon with no residual stenosis. 5. Normal left ventricular systolic function. LVEDP 7 mmHg. Ejection fraction 65%.  RECOMMENDATION: Continue dual antiplatelet therapy for at least a year. He needs aggressive secondary prevention including smoking cessation. We used Brilinta today. If there are issues with him affording the Brilinta long-term, I would load him with 300 mg of Plavix and then switch him to clopidogrel 75 mg daily.      Hospital Course  The patient is a 53 year old male with past medical history of CAD, HTN, DM, HLD who presented to [facility] on 09/26/2014 as NSTEMI. He had history of multiple PCI with stents to his LAD and RCA in 2000, and a repeat PCI of his LAD and RCA in 2014. On arrival, he states he has been having intermittent chest  discomfort for the past 72 hours. Initial lab finding include Cr 0.8, hemoglobin 18, troponin 0.18. His symptom was concerning for angina, since the patient had persistent chest pain despite nitroglycerin and heparin, after discussing various options, it was decided for the patient to undergo emergent cardiac catheterization. Risk and benefits were discussed with the patient who agreed to proceed.  He underwent emergent cardiac catheterization on the same day on 09/26/2014, which showed EF 65%, LVEDP 7 mmHg, patent stents in proximal and mid LAD with mild in-stent restenosis, significant 95% mid LAD lesion treated with 2.5 x 12 Synergy drug-eluting stent, postdilated to greater than 2.75 mm. there was also a focal area of 80% in stent restenosis in mid RCA treated with cutting balloon angioplasty with a 3.5 x 10mm balloon with no residual stenosis.   Post-cath, patient was transferred to CCU for observation overnight. He was seen in the morning of 09/27/2014, at which time he was doing well without significant chest discomfort or dyspnea. He is deemed stable for discharge from cardiology perspective if he can ambulate without significant chest discomfort. He can potentially obtain echocardiogram  as outpatient given normal EF on cath. Patient has been instructed to hold metformin for at least 48 hours post cardiac catheterization. I will arrange outpatient follow-up in 7-10 days. He will need to be seen by cardiac rehabilitation before discharge.   Of note, patient's home Pravachol has been discontinued, given the amount of significant coronary artery disease, we have started the patient on high-dose Lipitor.  Discharge Vitals Blood pressure 132/73, pulse 86, temperature 97.9 F (36.6 C), temperature source Oral, resp. rate  17, height  (1.905 m), weight 223 lb 1.7 oz (101.2 kg), SpO2 98 %.  Filed Weights   09/26/14 1133 09/27/14 0417  Weight: 234 lb (106.142 kg) 223 lb 1.7 oz (101.2 kg)     Labs  CBC  Recent Labs  09/26/14 1145 09/27/14 0025  WBC 9.3 9.9  HGB 18.0* 17.0  HCT 48.7 46.3  MCV 93.1 92.8  PLT 221 213   Basic Metabolic Panel  Recent Labs  09/26/14 1145 09/27/14 0025  NA 132* 134*  K 3.5 3.4*  CL 94* 101  CO2 23 22  GLUCOSE 154* 166*  BUN <5* 7  CREATININE 0.80 0.74  CALCIUM 9.1 9.0   Liver Function Tests  Recent Labs  09/26/14 1145  AST 35  ALT 40  ALKPHOS 80  BILITOT 1.1  PROT 7.8  ALBUMIN 4.4   Cardiac Enzymes  Recent Labs  09/26/14 1145 09/26/14 2058 09/27/14 0025 09/27/14 0725  CKTOTAL  --  107 91 87  CKMB  --  2.3 2.9 2.3  TROPONINI 0.18*  --   --   --     Disposition  Pt is being discharged home today in good condition.  Follow-up Plans & Appointments      Follow-up Information    Follow up with Clayton Bollman, MD.   Specialty:  Cardiology   Why:  Office will contact you to arrange followup, please give [place] a call if you do not hear from is in 2 business days   Contact information:   [address] [place] [address] [phone]       Discharge Medications    Medication List    STOP taking these medications        clonazePAM 0.5 MG tablet  Commonly known as:  KLONOPIN     pravastatin 40 MG tablet  Commonly known as:  PRAVACHOL      TAKE these medications        acetaminophen 500 MG tablet  Commonly known as:  TYLENOL  Take 1,000 mg by mouth daily as needed for mild pain.     aspirin EC 81 MG tablet  Take 81 mg by mouth every morning.     atorvastatin 80 MG tablet  Commonly known as:  LIPITOR  Take 1 tablet (80 mg total) by mouth daily at 6 PM.     busPIRone 15 MG tablet  Commonly known as:  BUSPAR  Take 30 mg by mouth at bedtime.     cyclobenzaprine 10 MG tablet  Commonly known as:  FLEXERIL  Take 10 mg by mouth 3 (three) times daily as needed. Muscle Spasms     DSS 100 MG Caps  Take 100 mg by mouth as needed (stool softener).     DULoxetine 30 MG  capsule  Commonly known as:  CYMBALTA  Take 30 mg by mouth daily.     gabapentin 600 MG tablet  Commonly known as:  NEURONTIN  Take 600 mg by mouth every 6 (six) hours.     glipiZIDE 10 MG tablet  Commonly known as:  GLUCOTROL  Take 10 mg by mouth daily before breakfast.     metFORMIN 1000 MG tablet  Commonly known as:  GLUCOPHAGE  Take 1 tablet (1,000 mg total) by mouth 2 (two) times daily.     metoprolol tartrate 25 MG tablet  Commonly known as:  LOPRESSOR  Take 1 tablet (25 mg total) by mouth 2 (two) times daily.  nitroGLYCERIN 0.4 MG SL tablet  Commonly known as:  NITROSTAT  Place 1 tablet (0.4 mg total) under the tongue every 5 (five) minutes as needed for chest pain.     pantoprazole 40 MG tablet  Commonly known as:  PROTONIX  Take 40 mg by mouth daily.     quinapril-hydrochlorothiazide 20-25 MG per tablet  Commonly known as:  ACCURETIC  Take 1 tablet by mouth every morning.     ticagrelor 90 MG Tabs tablet  Commonly known as:  BRILINTA  Take 1 tablet (90 mg total) by mouth 2 (two) times daily.        Outstanding Labs/Studies  Potentially outpatient echocardiogram  Duration of Discharge Encounter   Greater than 30 minutes including physician time.  Ramond DialSigned, Brenee Gajda PA-C Pager: [phone] 09/27/2014, 12:30 PM

## 2014-09-27 NOTE — Discharge Instructions (Signed)

## 2014-09-27 NOTE — Progress Notes (Signed)
Patient walked approximately 16550ft. No SOB or CP reported.Patient did complain of back and leg pain which is chronic and did not want to walk any further.

## 2014-09-27 NOTE — Progress Notes (Signed)
Utilization Review Completed.   Krishav Mamone, RN, BSN Nurse Case Manager  

## 2014-09-27 NOTE — Progress Notes (Addendum)
Patient ID: Clayton Foster, male   DOB: [birth year], 54 y.o.   MRN: [id number]   SUBJECTIVE: NSTEMI with DES to mLAD and cutting balloon angioplasty to mRCA yesterday.  Doing well today, no chest pain or dyspnea.   Scheduled Meds: . aspirin EC  81 mg Oral q morning - 10a  . atorvastatin  80 mg Oral q1800  . busPIRone  30 mg Oral QHS  . clonazePAM  0.5 mg Oral BID  . DULoxetine  30 mg Oral Daily  . gabapentin  600 mg Oral TID PC & HS  . glipiZIDE  10 mg Oral QAC breakfast  . heparin  5,000 Units Subcutaneous 3 times per day  . lisinopril  20 mg Oral Daily   And  . hydrochlorothiazide  25 mg Oral Daily  . [START ON 09/29/2014] metFORMIN  1,000 mg Oral BID WC  . metoprolol tartrate  25 mg Oral BID  . pantoprazole  40 mg Oral Daily  . ticagrelor  90 mg Oral BID   Continuous Infusions:  PRN Meds:.acetaminophen, cyclobenzaprine, docusate sodium, nitroGLYCERIN, ondansetron (ZOFRAN) IV    Filed Vitals:   09/27/14 0200 09/27/14 0417 09/27/14 0600 09/27/14 0947  BP: 85/56 116/66 130/63 132/73  Pulse: 64 69 70 86  Temp:  97.3 F (36.3 C)  97.9 F (36.6 C)  TempSrc:  Oral  Oral  Resp: Height:      Weight:  223 lb 1.7 oz (101.2 kg)    SpO2: 96% 96% 95% 98%    Intake/Output Summary (Last 24 hours) at 09/27/14 1120 Last data filed at 09/27/14 0900  Gross per 24 hour  Intake 1289.6 ml  Output      2 ml  Net 1287.6 ml    LABS: Basic Metabolic Panel:  Recent Labs  40/98/11 1145 09/27/14 0025  NA 132* 134*  K 3.5 3.4*  CL 94* 101  CO2 23 22  GLUCOSE 154* 166*  BUN <5* 7  CREATININE 0.80 0.74  CALCIUM 9.1 9.0   Liver Function Tests:  Recent Labs  09/26/14 1145  AST 35  ALT 40  ALKPHOS 80  BILITOT 1.1  PROT 7.8  ALBUMIN 4.4   No results for input(s): LIPASE, AMYLASE in the last 72 hours. CBC:  Recent Labs  09/26/14 1145 09/27/14 0025  WBC 9.3 9.9  HGB 18.0* 17.0  HCT 48.7 46.3  MCV 93.1 92.8  PLT 221 213   Cardiac Enzymes:  Recent  Labs  09/26/14 1145 09/26/14 2058 09/27/14 0025 09/27/14 0725  CKTOTAL  --  107 91 87  CKMB  --  2.3 2.9 2.3  TROPONINI 0.18*  --   --   --    BNP: Invalid input(s): POCBNP D-Dimer: No results for input(s): DDIMER in the last 72 hours. Hemoglobin A1C: No results for input(s): HGBA1C in the last 72 hours. Fasting Lipid Panel: No results for input(s): CHOL, HDL, LDLCALC, TRIG, CHOLHDL, LDLDIRECT in the last 72 hours. Thyroid Function Tests: No results for input(s): TSH, T4TOTAL, T3FREE, THYROIDAB in the last 72 hours.  Invalid input(s): FREET3 Anemia Panel: No results for input(s): VITAMINB12, FOLATE, FERRITIN, TIBC, IRON, RETICCTPCT in the last 72 hours.  RADIOLOGY: Dg Chest 2 View  09/26/2014   CLINICAL DATA:  Midsternal chest pain for 2 days. Hypertension. Diabetes. Smoker. Prior cardiac stents.  EXAM: CHEST  2 VIEW  COMPARISON:  11/12/2013  FINDINGS: Midline trachea. Normal heart size and mediastinal contours. No pleural effusion or pneumothorax. Diffuse peribronchial  thickening. Clear lungs.  IMPRESSION: 1.  No acute cardiopulmonary disease. 2. Mild peribronchial thickening which may relate to chronic bronchitis or smoking.   Electronically Signed   By: Jeronimo GreavesKyle  Talbot M.D.   On: 09/26/2014 13:47    PHYSICAL EXAM General: NAD Neck: No JVD, no thyromegaly or thyroid nodule.  Lungs: Clear to auscultation bilaterally with normal respiratory effort. CV: Nondisplaced PMI.  Heart regular S1/S2, no S3/S4, no murmur.  No peripheral edema.  No carotid bruit.  Normal pedal pulses.  Abdomen: Soft, nontender, no hepatosplenomegaly, no distention.  Neurologic: Alert and oriented x 3.  Psych: Normal affect. Extremities: No clubbing or cyanosis. Right radial artery cath site benign.  TELEMETRY: Reviewed telemetry pt in NSR  ASSESSMENT AND PLAN: 54 yo with prior CAD and DM presented with NSTEMI.  1. CAD: NSTEMI with DES to mLAD and [facility].  EF was  preserved at 65% by LV-gram.  He is feeling good today, no chest pain or dyspnea.  - Walk in halls.  If no problems, will let him go home later this afternoon.  - He will need to continue ASA/ticagrelor.  - Change statin to atorvastatin 80 mg daily.  - Continue metoprolol/lisinopril.  - Echo ordered but given normal EF on LV-gram, ok if it gets done as outpatient.  2. HTN: BP controlled.  3. Diabetes: Resume home regimen except hold metformin x 48 hours.  4. Disposition: If walks today and has no problems, I think he can go home this afternoon.  He will need followup in 7-10 days.  Will need cardiac rehab.  Home cardiac meds: ASA 81, Brilinta 90 bid, atorvastatin 80 daily, metoprolol 25 bid, home dose quinapril/HCTZ. Hold metformin 48 hours post-cath then resume.   Marca AnconaDalton Gregary Blackard 09/27/2014 11:25 AM

## 2014-09-28 ENCOUNTER — Telehealth: Payer: Self-pay | Admitting: *Deleted

## 2014-09-28 ENCOUNTER — Observation Stay (HOSPITAL_COMMUNITY)
Admission: EM | Admit: 2014-09-28 | Discharge: 2014-09-29 | DRG: 880 | Disposition: A | Discharge status: Home / Self Care | Payer: MEDICAID | Attending: Internal Medicine | Admitting: Internal Medicine

## 2014-09-28 ENCOUNTER — Encounter (HOSPITAL_COMMUNITY): Payer: Self-pay | Admitting: Emergency Medicine

## 2014-09-28 ENCOUNTER — Encounter: Payer: Medicaid Other | Admitting: Adult Health

## 2014-09-28 ENCOUNTER — Emergency Department (HOSPITAL_COMMUNITY): Payer: MEDICAID

## 2014-09-28 DIAGNOSIS — I252 Old myocardial infarction: Secondary | ICD-10-CM | POA: Diagnosis not present

## 2014-09-28 DIAGNOSIS — F419 Anxiety disorder, unspecified: Secondary | ICD-10-CM | POA: Insufficient documentation

## 2014-09-28 DIAGNOSIS — G8929 Other chronic pain: Secondary | ICD-10-CM | POA: Insufficient documentation

## 2014-09-28 DIAGNOSIS — Z79899 Other long term (current) drug therapy: Secondary | ICD-10-CM | POA: Diagnosis not present

## 2014-09-28 DIAGNOSIS — I214 Non-ST elevation (NSTEMI) myocardial infarction: Secondary | ICD-10-CM | POA: Diagnosis present

## 2014-09-28 DIAGNOSIS — I10 Essential (primary) hypertension: Secondary | ICD-10-CM | POA: Diagnosis not present

## 2014-09-28 DIAGNOSIS — M549 Dorsalgia, unspecified: Secondary | ICD-10-CM | POA: Diagnosis not present

## 2014-09-28 DIAGNOSIS — Z7982 Long term (current) use of aspirin: Secondary | ICD-10-CM | POA: Insufficient documentation

## 2014-09-28 DIAGNOSIS — E785 Hyperlipidemia, unspecified: Secondary | ICD-10-CM | POA: Insufficient documentation

## 2014-09-28 DIAGNOSIS — Z955 Presence of coronary angioplasty implant and graft: Secondary | ICD-10-CM | POA: Insufficient documentation

## 2014-09-28 DIAGNOSIS — R079 Chest pain, unspecified: Secondary | ICD-10-CM | POA: Diagnosis not present

## 2014-09-28 DIAGNOSIS — Z7902 Long term (current) use of antithrombotics/antiplatelets: Secondary | ICD-10-CM | POA: Diagnosis not present

## 2014-09-28 DIAGNOSIS — R000 Tachycardia, unspecified: Secondary | ICD-10-CM | POA: Diagnosis not present

## 2014-09-28 DIAGNOSIS — K219 Gastro-esophageal reflux disease without esophagitis: Secondary | ICD-10-CM | POA: Insufficient documentation

## 2014-09-28 DIAGNOSIS — F17210 Nicotine dependence, cigarettes, uncomplicated: Secondary | ICD-10-CM | POA: Insufficient documentation

## 2014-09-28 DIAGNOSIS — E119 Type 2 diabetes mellitus without complications: Secondary | ICD-10-CM | POA: Diagnosis not present

## 2014-09-28 DIAGNOSIS — R778 Other specified abnormalities of plasma proteins: Secondary | ICD-10-CM

## 2014-09-28 DIAGNOSIS — I2510 Atherosclerotic heart disease of native coronary artery without angina pectoris: Secondary | ICD-10-CM | POA: Insufficient documentation

## 2014-09-28 DIAGNOSIS — R7989 Other specified abnormal findings of blood chemistry: Secondary | ICD-10-CM

## 2014-09-28 LAB — CBC WITH DIFFERENTIAL/PLATELET
Basophils Absolute: 0.1 10*3/uL (ref 0.0–0.1)
Basophils Relative: 1 % (ref 0–1)
EOS PCT: 2 % (ref 0–5)
Eosinophils Absolute: 0.2 10*3/uL (ref 0.0–0.7)
HEMATOCRIT: 46.7 % (ref 39.0–52.0)
Hemoglobin: 17.2 g/dL — ABNORMAL HIGH (ref 13.0–17.0)
Lymphocytes Relative: 32 % (ref 12–46)
Lymphs Abs: 3.1 10*3/uL (ref 0.7–4.0)
MCH: 33.9 pg (ref 26.0–34.0)
MCHC: 36.8 g/dL — AB (ref 30.0–36.0)
MCV: 91.9 fL (ref 78.0–100.0)
MONO ABS: 0.8 10*3/uL (ref 0.1–1.0)
MONOS PCT: 8 % (ref 3–12)
NEUTROS ABS: 5.5 10*3/uL (ref 1.7–7.7)
Neutrophils Relative %: 57 % (ref 43–77)
Platelets: 205 10*3/uL (ref 150–400)
RBC: 5.08 MIL/uL (ref 4.22–5.81)
RDW: 12.3 % (ref 11.5–15.5)
WBC: 9.7 10*3/uL (ref 4.0–10.5)

## 2014-09-28 LAB — BASIC METABOLIC PANEL
ANION GAP: 12 (ref 5–15)
CO2: 23 mmol/L (ref 19–32)
Calcium: 9.7 mg/dL (ref 8.4–10.5)
Chloride: 100 mmol/L (ref 96–112)
Creatinine, Ser: 0.75 mg/dL (ref 0.50–1.35)
GFR calc Af Amer: >90 mL/min (ref >90)
Glucose, Bld: 148 mg/dL — ABNORMAL HIGH (ref 70–99)
Potassium: 3.8 mmol/L (ref 3.5–5.1)
Sodium: 135 mmol/L (ref 135–145)

## 2014-09-28 LAB — I-STAT TROPONIN, ED: Troponin i, poc: 0.15 ng/mL (ref 0.00–0.08)

## 2014-09-28 LAB — TROPONIN I
TROPONIN I: 0.13 ng/mL — AB (ref <0.031)
Troponin I: 0.16 ng/mL — ABNORMAL HIGH (ref <0.031)

## 2014-09-28 MED ORDER — LORAZEPAM 1 MG PO TABS
1.0000 mg | ORAL_TABLET | Freq: Once | ORAL | Status: AC
  Administered 2014-09-28: 1 mg via ORAL
  Filled 2014-09-28: qty 1

## 2014-09-28 NOTE — Telephone Encounter (Signed)
Joni ReiningKathryn Lawrence, NP notified of patients chest pain after stent. Patient informed of the need to bed seen in the ED. All questions answered and patient voiced understanding.

## 2014-09-28 NOTE — ED Provider Notes (Signed)
CSN: [id number]     Arrival date & time 09/28/14  1819 History   First MD Initiated Contact with Patient 09/28/14 1821     Chief Complaint  Patient presents with  . Chest Pain     (Consider location/radiation/quality/duration/timing/severity/associated sxs/prior Treatment) HPI   Patient with hx HTN, DM, anxiety, CAD with multiple stents, NSTEMI 2 days ago with cardiac cath on same day with stenting of LAD and balloon angioplasty of right coronary artery.  States he was sitting at home and developed gradually worsening chest pain and pressure, started at 3:30pm and lasting until about 5:00.  States he continues to have soreness.  Denies any associated symptoms.  No exacerbating symptoms.  States it did help the pain to get up and walk around.  States this feels exactly like both his prior MIs and his prior panic attacks.   Cardiologist is Dr Excell Seltzer.   Past Medical History  Diagnosis Date  . Anxiety   . Hypertension   . Diabetes mellitus   . Chronic back pain   . Chronic neck pain   . Coronary artery disease     a. 2000: s/p PCI/stenting of prox LAD and RCA @ [facility];  b. 09/2012 Cath/PCI: LM 30d, LAD 40p ISR, 22m, 33m/d (2.25x20 Promus Premier DES), LCX 30p, RCA 80p (3.0x24 Promus Premier DES), patent mid stent, 58m, PDA 25m (2.75x28 Promus Premier DES), EF 60-65%. c. cath 09/26/2014 EF 65%, patent LAD stents, DES 2.5 x 12 Synergy to mid LAD, 80% RCA stenosis treated with balloon angio  . Hyperlipidemia   . Tobacco abuse    Past Surgical History  Procedure Laterality Date  . Back surgery    . Appendectomy    . Tonsillectomy    . Cardiac stents    . Left heart catheterization with coronary angiogram N/A 10/11/2012    Procedure: LEFT HEART CATHETERIZATION WITH CORONARY ANGIOGRAM;  Surgeon: Laurey Morale, MD;  Location[facility] CATH LAB;  Service: Cardiovascular;  Laterality: N/A;  . Percutaneous coronary stent intervention (pci-s) N/A 10/14/2012    Procedure: PERCUTANEOUS CORONARY STENT  INTERVENTION (PCI-S);  Surgeon: Tonny Bollman, MD;  Location: [facility] CATH LAB;  Service: Cardiovascular;  Laterality: N/A;  . Left heart catheterization with coronary angiogram N/A 09/26/2014    Procedure: LEFT HEART CATHETERIZATION WITH CORONARY ANGIOGRAM;  Surgeon: Corky Crafts, MD;  Location[facility] CATH LAB;  Service: Cardiovascular;  Laterality: N/A;  . Percutaneous coronary stent intervention (pci-s)  09/26/2014    Procedure: PERCUTANEOUS CORONARY STENT INTERVENTION (PCI-S);  Surgeon: Corky Crafts, MD;  Location: [facility] CATH LAB;  Service: Cardiovascular;;   Family History  Problem Relation Age of Onset  . Hypertension Father    History  Substance Use Topics  . Smoking status: Current Every Day Smoker -- 0.75 packs/day    Types: Cigarettes  . Smokeless tobacco: Former Neurosurgeon  . Alcohol Use: No    Review of Systems  All other systems reviewed and are negative.     Allergies  Review of patient's allergies indicates no known allergies.  Home Medications   Prior to Admission medications   Medication Sig Start Date End Date Taking? Authorizing Provider  acetaminophen (TYLENOL) 500 MG tablet Take 1,000 mg by mouth daily as needed for mild pain.     Historical Provider, MD  aspirin EC 81 MG tablet Take 81 mg by mouth every morning.     Historical Provider, MD  atorvastatin (LIPITOR) 80 MG tablet Take 1 tablet (80 mg total) by mouth  daily at 6 PM. 09/27/14   Azalee Course, PA  busPIRone (BUSPAR) 15 MG tablet Take 30 mg by mouth at bedtime.     Historical Provider, MD  cyclobenzaprine (FLEXERIL) 10 MG tablet Take 10 mg by mouth 3 (three) times daily as needed. Muscle Spasms    Historical Provider, MD  Docusate Sodium (DSS) 100 MG CAPS Take 100 mg by mouth as needed (stool softener).  11/13/13   Henderson Cloud, MD  DULoxetine (CYMBALTA) 30 MG capsule Take 30 mg by mouth daily.    Historical Provider, MD  gabapentin (NEURONTIN) 600 MG tablet Take 600 mg by mouth every 6 (six) hours.   11/13/13   Henderson Cloud, MD  glipiZIDE (GLUCOTROL) 10 MG tablet Take 10 mg by mouth daily before breakfast.    Historical Provider, MD  metFORMIN (GLUCOPHAGE) 1000 MG tablet Take 1 tablet (1,000 mg total) by mouth 2 (two) times daily. 07/06/14   Zadie Rhine, MD  metoprolol tartrate (LOPRESSOR) 25 MG tablet Take 1 tablet (25 mg total) by mouth 2 (two) times daily. 09/27/14   Azalee Course, PA  nitroGLYCERIN (NITROSTAT) 0.4 MG SL tablet Place 1 tablet (0.4 mg total) under the tongue every 5 (five) minutes as needed for chest pain. 01/29/14   Jodelle Gross, NP  pantoprazole (PROTONIX) 40 MG tablet Take 40 mg by mouth daily.    Historical Provider, MD  quinapril-hydrochlorothiazide (ACCURETIC) 20-25 MG per tablet Take 1 tablet by mouth every morning. 02/23/14   Jodelle Gross, NP  ticagrelor (BRILINTA) 90 MG TABS tablet Take 1 tablet (90 mg total) by mouth 2 (two) times daily. 09/27/14   Azalee Course, PA   BP 144/79 mmHg  Pulse 74  Temp(Src) 98.1 F (36.7 C) (Oral)  Resp 13  Ht  (1.905 m)  Wt 230 lb (104.327 kg)  BMI 28.75 kg/m2  SpO2 98% Physical Exam  Constitutional: He appears well-developed and well-nourished. No distress.  HENT:  Head: Normocephalic and atraumatic.  Eyes: Conjunctivae are normal.  Neck: Neck supple.  Cardiovascular: Normal rate and regular rhythm.   Pulmonary/Chest: Effort normal and breath sounds normal. No respiratory distress. He has no wheezes. He has no rales. He exhibits no tenderness.  Abdominal: Soft. He exhibits no distension and no mass. There is no tenderness. There is no rebound and no guarding.  Musculoskeletal: He exhibits no edema.  Neurological: He is alert. He exhibits normal muscle tone.  Skin: He is not diaphoretic.  Nursing note and vitals reviewed.   ED Course  Procedures (including critical care time) Labs Review Labs Reviewed  CBC WITH DIFFERENTIAL/PLATELET - Abnormal; Notable for the following:    Hemoglobin 17.2 (*)     MCHC 36.8 (*)    All other components within normal limits  BASIC METABOLIC PANEL - Abnormal; Notable for the following:    Glucose, Bld 148 (*)    BUN <5 (*)    All other components within normal limits  TROPONIN I - Abnormal; Notable for the following:    Troponin I 0.13 (*)    All other components within normal limits  I-STAT TROPOININ, ED - Abnormal; Notable for the following:    Troponin i, poc 0.15 (*)    All other components within normal limits    Imaging Review No results found.   EKG Interpretation None       8:24 PM  I spoke with cardiology who has reviewed the patient's chart, EKG, and troponins.  He recommends  repeat troponin at 3 hours and if the troponin is unchanged or improving he may go home.    11:57 PM  Discussed pt again with cardiology fellow who states this troponin is statistically no different from the previously one and this is the expected outcome after an NSTEMI.  He states that if I would like to admit him for observation I could call the hospitalists for admission but he would not admit him or see the patient in consult, requesting that I pass on what he said to the admitting team.  I discussed this with patient and I have offered the patient admission for continued observation overnight to make sure this increase in troponin does not continue.  Pt would prefer not to have admission, states he really thinks the chest pain is from his nerves and states he would like to go home.  If repeat EKG is unchanged and pt truly will not accept admission, will d/c home.   12:21 AM Discussed pt with Dr Mora Bellmanni who has reviewed the repeat EKG.  It is unclear if there may be new changes secondary to uneven baseline in prior EKG.  He agrees that patient should stay for observation.  I have discussed our concerns with the patient who agrees to observation admission.    12:37 AM I spoke with Dr Welton FlakesKhan who accepts admission.    MDM   Final diagnoses:  Chest pain, unspecified  chest pain type  Elevated troponin    Pt with hx HTN DM CAD with NSTEMI two days ago p/w chest pressure that feels both like chest pain episodes that ended up requiring stents and also like previous panic attacks.  Troponin elevated, but improved from discharge.  Repeat troponin more elevated than first.  Please see discussion above.  Pt agreed to admission to the hospital for observation, serial troponins.  Dr Welton FlakesKhan accepts admission.      St. StephenEmily Avonna Iribe, PA-C 09/29/14 0041  Jerelyn ScottMartha Linker, MD 09/29/14 [phone]

## 2014-09-28 NOTE — ED Notes (Signed)
Patient was discharged yesterday due to a stint being placed. Patient states he started having chest pressure in the center of his chest. Patient rates pain 4/10/ Patient states he took 5 81mg  ASA and 1 Nitro.

## 2014-09-28 NOTE — Progress Notes (Signed)
ERROR

## 2014-09-28 NOTE — Telephone Encounter (Signed)
PT IS STILL HAVING CP AFTER STENTS, NOT SURE IF IT IS MUSCLE SORENESS OR NOT

## 2014-09-29 ENCOUNTER — Other Ambulatory Visit (HOSPITAL_COMMUNITY): Payer: Self-pay

## 2014-09-29 DIAGNOSIS — E119 Type 2 diabetes mellitus without complications: Secondary | ICD-10-CM

## 2014-09-29 DIAGNOSIS — I10 Essential (primary) hypertension: Secondary | ICD-10-CM

## 2014-09-29 DIAGNOSIS — I214 Non-ST elevation (NSTEMI) myocardial infarction: Secondary | ICD-10-CM

## 2014-09-29 DIAGNOSIS — R079 Chest pain, unspecified: Secondary | ICD-10-CM

## 2014-09-29 DIAGNOSIS — R0789 Other chest pain: Secondary | ICD-10-CM

## 2014-09-29 LAB — GLUCOSE, CAPILLARY
GLUCOSE-CAPILLARY: 141 mg/dL — AB (ref 70–99)
Glucose-Capillary: 151 mg/dL — ABNORMAL HIGH (ref 70–99)
Glucose-Capillary: 52 mg/dL — ABNORMAL LOW (ref 70–99)
Glucose-Capillary: 136 mg/dL — ABNORMAL HIGH (ref 70–99)

## 2014-09-29 LAB — TROPONIN I
TROPONIN I: 0.12 ng/mL — AB (ref <0.031)
TROPONIN I: 0.11 ng/mL — AB (ref <0.031)

## 2014-09-29 MED ORDER — ZOLPIDEM TARTRATE 5 MG PO TABS: 5.0000 mg | ORAL_TABLET | Freq: Every evening | ORAL | Status: DC | PRN

## 2014-09-29 MED ORDER — PANTOPRAZOLE SODIUM 40 MG PO TBEC
40.0000 mg | DELAYED_RELEASE_TABLET | Freq: Every day | ORAL | Status: DC
  Administered 2014-09-29: 40 mg via ORAL
  Filled 2014-09-29: qty 1

## 2014-09-29 MED ORDER — QUINAPRIL-HYDROCHLOROTHIAZIDE 20-25 MG PO TABS: 1.0000 | ORAL_TABLET | Freq: Every morning | ORAL | Status: DC

## 2014-09-29 MED ORDER — ACETAMINOPHEN 325 MG PO TABS: 650.0000 mg | ORAL_TABLET | ORAL | Status: DC | PRN

## 2014-09-29 MED ORDER — INSULIN ASPART 100 UNIT/ML ~~LOC~~ SOLN
0.0000 [IU] | Freq: Three times a day (TID) | SUBCUTANEOUS | Status: DC
  Administered 2014-09-29: 2 [IU] via SUBCUTANEOUS

## 2014-09-29 MED ORDER — METFORMIN HCL 500 MG PO TABS
1000.0000 mg | ORAL_TABLET | Freq: Two times a day (BID) | ORAL | Status: DC
Start: 2014-09-29 — End: 2014-09-29
  Administered 2014-09-29: 1000 mg via ORAL
  Filled 2014-09-29: qty 2

## 2014-09-29 MED ORDER — ONDANSETRON HCL 4 MG/2ML IJ SOLN: 4.0000 mg | Freq: Four times a day (QID) | INTRAMUSCULAR | Status: DC | PRN

## 2014-09-29 MED ORDER — METOPROLOL TARTRATE 25 MG PO TABS
25.0000 mg | ORAL_TABLET | Freq: Two times a day (BID) | ORAL | Status: DC
  Administered 2014-09-29: 25 mg via ORAL
  Filled 2014-09-29: qty 1

## 2014-09-29 MED ORDER — ATORVASTATIN CALCIUM 80 MG PO TABS: 80.0000 mg | ORAL_TABLET | Freq: Every day | ORAL | Status: DC

## 2014-09-29 MED ORDER — LISINOPRIL 20 MG PO TABS
20.0000 mg | ORAL_TABLET | Freq: Every day | ORAL | Status: DC
  Administered 2014-09-29: 20 mg via ORAL
  Filled 2014-09-29: qty 1

## 2014-09-29 MED ORDER — CLONAZEPAM 0.5 MG PO TABS
0.2500 mg | ORAL_TABLET | Freq: Two times a day (BID) | ORAL | Status: DC | PRN
  Administered 2014-09-29: 0.25 mg via ORAL
  Filled 2014-09-29: qty 1

## 2014-09-29 MED ORDER — MORPHINE SULFATE 2 MG/ML IJ SOLN: 2.0000 mg | INTRAMUSCULAR | Status: DC | PRN

## 2014-09-29 MED ORDER — DOCUSATE SODIUM 100 MG PO CAPS
100.0000 mg | ORAL_CAPSULE | ORAL | Status: DC | PRN
Start: 2014-09-29 — End: 2014-09-29

## 2014-09-29 MED ORDER — NITROGLYCERIN 0.4 MG SL SUBL
0.4000 mg | SUBLINGUAL_TABLET | SUBLINGUAL | Status: DC | PRN
Start: 2014-09-29 — End: 2014-09-29

## 2014-09-29 MED ORDER — GABAPENTIN 600 MG PO TABS
600.0000 mg | ORAL_TABLET | Freq: Four times a day (QID) | ORAL | Status: DC
  Administered 2014-09-29 (×2): 600 mg via ORAL
  Filled 2014-09-29 (×2): qty 1

## 2014-09-29 MED ORDER — ACETAMINOPHEN 500 MG PO TABS: 1000.0000 mg | ORAL_TABLET | Freq: Every day | ORAL | Status: DC | PRN

## 2014-09-29 MED ORDER — GLIPIZIDE 10 MG PO TABS
10.0000 mg | ORAL_TABLET | Freq: Every day | ORAL | Status: DC
  Administered 2014-09-29: 10 mg via ORAL
  Filled 2014-09-29: qty 1

## 2014-09-29 MED ORDER — NITROGLYCERIN 2 % TD OINT
0.5000 [in_us] | TOPICAL_OINTMENT | Freq: Four times a day (QID) | TRANSDERMAL | Status: DC
  Administered 2014-09-29 (×2): 0.5 [in_us] via TOPICAL
  Filled 2014-09-29: qty 1

## 2014-09-29 MED ORDER — ASPIRIN EC 81 MG PO TBEC
81.0000 mg | DELAYED_RELEASE_TABLET | Freq: Every morning | ORAL | Status: DC
  Administered 2014-09-29: 81 mg via ORAL
  Filled 2014-09-29: qty 1

## 2014-09-29 MED ORDER — BUSPIRONE HCL 10 MG PO TABS: 30.0000 mg | ORAL_TABLET | Freq: Every day | ORAL | Status: DC

## 2014-09-29 MED ORDER — MORPHINE SULFATE 4 MG/ML IJ SOLN
INTRAMUSCULAR | Status: AC
  Administered 2014-09-29: 4 mg
  Filled 2014-09-29: qty 1

## 2014-09-29 MED ORDER — DULOXETINE HCL 30 MG PO CPEP
30.0000 mg | ORAL_CAPSULE | Freq: Every day | ORAL | Status: DC
Start: 2014-09-29 — End: 2014-09-29
  Administered 2014-09-29: 30 mg via ORAL
  Filled 2014-09-29: qty 1

## 2014-09-29 MED ORDER — CYCLOBENZAPRINE HCL 10 MG PO TABS: 10.0000 mg | ORAL_TABLET | Freq: Three times a day (TID) | ORAL | Status: DC | PRN

## 2014-09-29 MED ORDER — HYDROCHLOROTHIAZIDE 25 MG PO TABS
25.0000 mg | ORAL_TABLET | Freq: Every day | ORAL | Status: DC
  Administered 2014-09-29: 25 mg via ORAL
  Filled 2014-09-29: qty 1

## 2014-09-29 MED ORDER — TICAGRELOR 90 MG PO TABS
90.0000 mg | ORAL_TABLET | Freq: Two times a day (BID) | ORAL | Status: DC
  Administered 2014-09-29: 90 mg via ORAL
  Filled 2014-09-29: qty 1

## 2014-09-29 NOTE — Consult Note (Signed)
CARDIOLOGY CONSULT NOTE  Patient ID: Clayton Foster MRN: [id number] DOB/AGE: Apr 26, 1961 54 y.o.  Admit date: 09/28/2014 Reason for Consultation: Chest pain  HPI: 54 yo with history of DM, HTN, and CAD s/p recent MI came back to the ER with chest pain tonight.  Patient was admitted with NSTEMI and had LHC this past Saturday with DES to mid LAD and PTCA to RCA.  He did well post-procedure and was sent home Sunday afternoon.  He did well until about 3:30 today.  He went to [facility] today to get some of his medications.  In [place], he developed central chest pressure and palpitations.  He came home but pain did not go away.  He felt his heart beating hard.  He checked his BP and it was high.  After about 2 hrs, the pain eased from 7/10 to 2/10.  It has been 2/10 since then.  NTG did not help. He came to the ER.  ECG was unchanged.  Troponin was flat, ranging 0.15 => 0.13 => 0.16.  He says that he has had frequent panic attacks and that this feels like a panic attack.    Review of systems complete and found to be negative unless listed above in HPI  Past Medical History  Diagnosis Date  . Anxiety   . Hypertension   . Diabetes mellitus   . Chronic back pain   . Chronic neck pain   . Coronary artery disease     a. 2000: s/p PCI/stenting of prox LAD and RCA @ [facility];  b. 09/2012 Cath/PCI: LM 30d, LAD 40p ISR, 50m, 58m/d (2.25x20 Promus Premier DES), LCX 30p, RCA 80p (3.0x24 Promus Premier DES), patent mid stent, 16m, PDA 9m (2.75x28 Promus Premier DES), EF 60-65%. c. cath 09/26/2014 EF 65%, patent LAD stents, DES 2.5 x 12 Synergy to mid LAD, 80% RCA stenosis treated with balloon angio  . Hyperlipidemia   . Tobacco abuse    Current Facility-Administered Medications  Medication Dose Route Frequency Provider Last Rate Last Dose  . nitroGLYCERIN (NITROGLYN) 2 % ointment 0.5 inch  0.5 inch Topical 4 times per day Yevonne Pax, MD   0.5 inch at 09/29/14 0116   Current Outpatient Prescriptions    Medication Sig Dispense Refill  . acetaminophen (TYLENOL) 500 MG tablet Take 1,000 mg by mouth daily as needed for mild pain.     . ASPERCREME W/LIDOCAINE EX Apply 1 application topically daily as needed (pain).    Marland Kitchen aspirin EC 81 MG tablet Take 81 mg by mouth every morning.     Marland Kitchen atorvastatin (LIPITOR) 80 MG tablet Take 1 tablet (80 mg total) by mouth daily at 6 PM. 30 tablet 11  . busPIRone (BUSPAR) 15 MG tablet Take 30 mg by mouth at bedtime.     . cyclobenzaprine (FLEXERIL) 10 MG tablet Take 10 mg by mouth 3 (three) times daily as needed. Muscle Spasms    . Docusate Sodium (DSS) 100 MG CAPS Take 100 mg by mouth as needed (stool softener).     . DULoxetine (CYMBALTA) 30 MG capsule Take 30 mg by mouth daily.    Marland Kitchen gabapentin (NEURONTIN) 600 MG tablet Take 600 mg by mouth every 6 (six) hours.     Marland Kitchen glipiZIDE (GLUCOTROL) 10 MG tablet Take 10 mg by mouth daily before breakfast.    . metFORMIN (GLUCOPHAGE) 1000 MG tablet Take 1 tablet (1,000 mg total) by mouth 2 (two) times daily. 60 tablet 1  . metoprolol tartrate (  LOPRESSOR) 25 MG tablet Take 1 tablet (25 mg total) by mouth 2 (two) times daily. 60 tablet 11  . nitroGLYCERIN (NITROSTAT) 0.4 MG SL tablet Place 1 tablet (0.4 mg total) under the tongue every 5 (five) minutes as needed for chest pain. 25 tablet 1  . pantoprazole (PROTONIX) 40 MG tablet Take 40 mg by mouth daily.    . quinapril-hydrochlorothiazide (ACCURETIC) 20-25 MG per tablet Take 1 tablet by mouth every morning. 30 tablet 6  . ticagrelor (BRILINTA) 90 MG TABS tablet Take 1 tablet (90 mg total) by mouth 2 (two) times daily. (Patient not taking: Reported on 09/28/2014) 180 tablet 3     Family History  Problem Relation Age of Onset  . Hypertension Father     History   Social History  . Marital Status: Divorced    Spouse Name: N/A  . Number of Children: 2  . Years of Education: N/A   Occupational History  . Unemployed    Social History Main Topics  . Smoking status:  Current Every Day Smoker -- 0.75 packs/day    Types: Cigarettes  . Smokeless tobacco: Former NeurosurgeonUser  . Alcohol Use: No  . Drug Use: No  . Sexual Activity: No   Other Topics Concern  . Not on file   Social History Narrative     Physical exam Blood pressure 120/72, pulse 80, temperature 98.1 F (36.7 C), temperature source Oral, resp. rate 17, height 6\' 3"  (1.905 m), weight 230 lb (104.327 kg), SpO2 95 %. General: NAD Neck: No JVD, no thyromegaly or thyroid nodule.  Lungs: Clear to auscultation bilaterally with normal respiratory effort. CV: Nondisplaced PMI.  Heart regular S1/S2, no S3/S4, no murmur.  No peripheral edema.  No carotid bruit.  Normal pedal pulses.  Abdomen: Soft, nontender, no hepatosplenomegaly, no distention.  Skin: Intact without lesions or rashes.  Neurologic: Alert and oriented x 3.  Psych: Normal affect. Extremities: No clubbing or cyanosis.  HEENT: Normal.   Labs:   Lab Results  Component Value Date   WBC 9.7 09/28/2014   HGB 17.2* 09/28/2014   HCT 46.7 09/28/2014   MCV 91.9 09/28/2014   PLT 205 09/28/2014    Recent Labs Lab 09/26/14 1145  09/28/14 1938  NA 132*  < > 135  K 3.5  < > 3.8  CL 94*  < > 100  CO2 23  < > 23  BUN <5*  < > <5*  CREATININE 0.80  < > 0.75  CALCIUM 9.1  < > 9.7  PROT 7.8  --   --   BILITOT 1.1  --   --   ALKPHOS 80  --   --   ALT 40  --   --   AST 35  --   --   GLUCOSE 154*  < > 148*  < > = values in this interval not displayed. Lab Results  Component Value Date   CKTOTAL 87 09/27/2014   CKMB 2.3 09/27/2014   TROPONINI 0.16* 09/28/2014    TnI 0.15 => 0.13 => 0.16  Radiology: - CXR: Normal  EKG: NSR, old inferior MI, old ASMI, anterolateral TWIs.  Not significantly different than prior.   ASSESSMENT AND PLAN: 54 yo with history of DM, HTN, and CAD s/p recent MI came back to the ER with chest pain tonight. Patient was admitted with NSTEMI and had LHC this past Saturday with DES to mid LAD and PTCA to RCA.  He  has had recurrent chest pain.  No evidence for stent thrombosis: ECG looks similar to prior (somewhat improved) and troponin is very mildly elevated with flat trend.  This degree of troponin elevation certainly could be seen following NSTEMI from this past Saturday. I think it is most likely that he had a panic attack (has had before).  However, not unreasonable to observe overnight.  Would recheck ECG in the morning and would cycle troponin.  If no significant upward troponin trend, think he could go home in the am. Continue all his home meds.   Marca Ancona 09/29/2014 1:33 AM

## 2014-09-29 NOTE — Discharge Instructions (Signed)
Chest Pain Observation  It is often hard to give a specific diagnosis for the cause of chest pain. Among other possibilities your symptoms might be caused by inadequate oxygen delivery to your heart (angina). Angina that is not treated or evaluated can lead to a heart attack (myocardial infarction) or death.  Blood tests, electrocardiograms, and X-rays may have been done to help determine a possible cause of your chest pain. After evaluation and observation, your health care provider has determined that it is unlikely your pain was caused by an unstable condition that requires hospitalization. However, a full evaluation of your pain may need to be completed, with additional diagnostic testing as directed. It is very important to keep your follow-up appointments. Not keeping your follow-up appointments could result in permanent heart damage, disability, or death. If there is any problem keeping your follow-up appointments, you must call your health care provider.  HOME CARE INSTRUCTIONS   Due to the slight chance that your pain could be angina, it is important to follow your health care provider's treatment plan and also maintain a healthy lifestyle:  · Maintain or work toward achieving a healthy weight.  · Stay physically active and exercise regularly.  · Decrease your salt intake.  · Eat a balanced, healthy diet. Talk to a dietitian to learn about heart-healthy foods.  · Increase your fiber intake by including whole grains, vegetables, fruits, and nuts in your diet.  · Avoid situations that cause stress, anger, or depression.  · Take medicines as advised by your health care provider. Report any side effects to your health care provider. Do not stop medicines or adjust the dosages on your own.  · Quit smoking. Do not use nicotine patches or gum until you check with your health care provider.  · Keep your blood pressure, blood sugar, and cholesterol levels within normal limits.  · Limit alcohol intake to no more than  1 drink per day for women who are not pregnant and 2 drinks per day for men.  · Do not abuse drugs.  SEEK IMMEDIATE MEDICAL CARE IF:  You have severe chest pain or pressure which may include symptoms such as:  · You feel pain or pressure in your arms, neck, jaw, or back.  · You have severe back or abdominal pain, feel sick to your stomach (nauseous), or throw up (vomit).  · You are sweating profusely.  · You are having a fast or irregular heartbeat.  · You feel short of breath while at rest.  · You notice increasing shortness of breath during rest, sleep, or with activity.  · You have chest pain that does not get better after rest or after taking your usual medicine.  · You wake from sleep with chest pain.  · You are unable to sleep because you cannot breathe.  · You develop a frequent cough or you are coughing up blood.  · You feel dizzy, faint, or experience extreme fatigue.  · You develop severe weakness, dizziness, fainting, or chills.  Any of these symptoms may represent a serious problem that is an emergency. Do not wait to see if the symptoms will go away. Call your local emergency services (911 in the [place] Do not drive yourself to the hospital.  MAKE SURE YOU:  · Understand these instructions.  · Will watch your condition.  · Will get help right away if you are not doing well or get worse.  Document Released: 08/12/2010 Document Revised: 07/15/2013 Document Reviewed: 01/09/2013    ExitCare® Patient Information ©2015 [employer]. This information is not intended to replace advice given to you by your health care provider. Make sure you discuss any questions you have with your health care provider.

## 2014-09-29 NOTE — H&P (Signed)
[facility] History and Physical  Clayton LevyRichard W Reposa ZOX:[id number] DOB: 02-06-1961 DOA: 09/28/2014  Referring physician: Trixie DredgeEmily West PA PCP: Tylene FantasiaMUSE,ROCHELLE D., PA-C   Chief Complaint: Chest Pain  HPI: Clayton LevyRichard W Foster is a 54 y.o. male presents with chest pain. He sates he was admitted here with NSTEMI about 2 days. He underwent a balloon angioplasty as well as stent placement. He states he started having chest pain in the evening. He states the pain was more of a pressure. It did not appear to be like his pain when had his MI. He states he called his cardiologist and was told to come to the ED> In the ED he was noted to have an elevated troponin. A repeat was ordered and this was slightly higher. It was decided to admit for further workup. He states right now he is pain free. He states it came back briefly after he heard about his test results.   Review of Systems:  Constitutional:  No weight loss, night sweats, Fevers, chills, fatigue.  HEENT:  No headaches Cardio-vascular:  ++chest pain, no Orthopnea, PND, swelling in lower extremities  GI:  No heartburn, indigestion, abdominal pain, nausea, vomiting, diarrhea  Resp:  No shortness of breath with exertion or at rest. No excess mucus, no productive cough, No coughing up of blood.No change in color of mucus Skin:  no rash or lesions.  GU:  no dysuria, change in color of urine, no urgency or frequency. No flank pain.  Musculoskeletal:  No joint pain or swelling. No decreased range of motion. No back pain.  Psych:  No change in mood or affect. ++anxiety. No memory loss.   Past Medical History  Diagnosis Date  . Anxiety   . Hypertension   . Diabetes mellitus   . Chronic back pain   . Chronic neck pain   . Coronary artery disease     a. 2000: s/p PCI/stenting of prox LAD and RCA @ [facility];  b. 09/2012 Cath/PCI: LM 30d, LAD 40p ISR, 10453m, 3066m/d (2.25x20 Promus Premier DES), LCX 30p, RCA 80p (3.0x24 Promus Premier DES), patent mid  stent, 4453m, PDA 3966m (2.75x28 Promus Premier DES), EF 60-65%. c. cath 09/26/2014 EF 65%, patent LAD stents, DES 2.5 x 12 Synergy to mid LAD, 80% RCA stenosis treated with balloon angio  . Hyperlipidemia   . Tobacco abuse    Past Surgical History  Procedure Laterality Date  . Back surgery    . Appendectomy    . Tonsillectomy    . Cardiac stents    . Left heart catheterization with coronary angiogram N/A 10/11/2012    Procedure: LEFT HEART CATHETERIZATION WITH CORONARY ANGIOGRAM;  Surgeon: Laurey Moralealton S McLean, MD;  [facility];  Service: Cardiovascular;  Laterality: N/A;  . Percutaneous coronary stent intervention (pci-s) N/A 10/14/2012    Procedure: PERCUTANEOUS CORONARY STENT INTERVENTION (PCI-S);  Surgeon: Tonny BollmanMichael Cooper, MD;  [facility];  Service: Cardiovascular;  Laterality: N/A;  . Left heart catheterization with coronary angiogram N/A 09/26/2014    Procedure: LEFT HEART CATHETERIZATION WITH CORONARY ANGIOGRAM;  Surgeon: Corky CraftsJayadeep S Varanasi, MD;  [facility];  Service: Cardiovascular;  Laterality: N/A;  . Percutaneous coronary stent intervention (pci-s)  09/26/2014    Procedure: PERCUTANEOUS CORONARY STENT INTERVENTION (PCI-S);  Surgeon: Corky CraftsJayadeep S Varanasi, MD;  [facility];  Service: Cardiovascular;;   Social History:  reports that he has been smoking Cigarettes.  He has been smoking about 0.75 packs per day. He has quit  using smokeless tobacco. He reports that he does not drink alcohol or use illicit drugs.  No Known Allergies  Family History  Problem Relation Age of Onset  . Hypertension Father      Prior to Admission medications   Medication Sig Start Date End Date Taking? Authorizing Provider  acetaminophen (TYLENOL) 500 MG tablet Take 1,000 mg by mouth daily as needed for mild pain.    Yes Historical Provider, MD  ASPERCREME W/LIDOCAINE EX Apply 1 application topically daily as needed (pain).   Yes Historical Provider, MD  aspirin EC 81 MG tablet  Take 81 mg by mouth every morning.    Yes Historical Provider, MD  atorvastatin (LIPITOR) 80 MG tablet Take 1 tablet (80 mg total) by mouth daily at 6 PM. 09/27/14  Yes Azalee Course, PA  busPIRone (BUSPAR) 15 MG tablet Take 30 mg by mouth at bedtime.    Yes Historical Provider, MD  cyclobenzaprine (FLEXERIL) 10 MG tablet Take 10 mg by mouth 3 (three) times daily as needed. Muscle Spasms   Yes Historical Provider, MD  Docusate Sodium (DSS) 100 MG CAPS Take 100 mg by mouth as needed (stool softener).  11/13/13  Yes Henderson Cloud, MD  DULoxetine (CYMBALTA) 30 MG capsule Take 30 mg by mouth daily.   Yes Historical Provider, MD  gabapentin (NEURONTIN) 600 MG tablet Take 600 mg by mouth every 6 (six) hours.  11/13/13  Yes Estela Isaiah Blakes, MD  glipiZIDE (GLUCOTROL) 10 MG tablet Take 10 mg by mouth daily before breakfast.   Yes Historical Provider, MD  metFORMIN (GLUCOPHAGE) 1000 MG tablet Take 1 tablet (1,000 mg total) by mouth 2 (two) times daily. 07/06/14  Yes Zadie Rhine, MD  metoprolol tartrate (LOPRESSOR) 25 MG tablet Take 1 tablet (25 mg total) by mouth 2 (two) times daily. 09/27/14  Yes Azalee Course, PA  nitroGLYCERIN (NITROSTAT) 0.4 MG SL tablet Place 1 tablet (0.4 mg total) under the tongue every 5 (five) minutes as needed for chest pain. 01/29/14  Yes Jodelle Gross, NP  pantoprazole (PROTONIX) 40 MG tablet Take 40 mg by mouth daily.   Yes Historical Provider, MD  quinapril-hydrochlorothiazide (ACCURETIC) 20-25 MG per tablet Take 1 tablet by mouth every morning. 02/23/14  Yes Jodelle Gross, NP  ticagrelor (BRILINTA) 90 MG TABS tablet Take 1 tablet (90 mg total) by mouth 2 (two) times daily. Patient not taking: Reported on 09/28/2014 09/27/14   Azalee Course, PA   Physical Exam: Filed Vitals:   09/28/14 2345 09/29/14 0000 09/29/14 0015 09/29/14 0030  BP: 116/74 139/91 133/83 136/77  Pulse: 82 83 73 73  Temp:      TempSrc:      Resp: Height:      Weight:      SpO2: 98%  96% 93% 87%    Wt Readings from Last 3 Encounters:  09/28/14 104.327 kg (230 lb)  09/27/14 101.2 kg (223 lb 1.7 oz)  07/06/14 101.606 kg (224 lb)    General:  Appears calm and comfortable Eyes: PERRL, normal lids, irises & conjunctiva ENT: grossly normal hearing, lips & tongue Neck: no LAD, masses or thyromegaly Cardiovascular: RRR, no m/r/g. No LE edema. Respiratory: CTA bilaterally, no w/r/r. Normal respiratory effort. Abdomen: soft, ntnd Skin: no rash or induration seen on limited exam Musculoskeletal: grossly normal tone BUE/BLE Psychiatric: grossly normal mood and affect, speech fluent and appropriate Neurologic: grossly non-focal.          Labs  on Admission:  Basic Metabolic Panel:  Recent Labs Lab 09/26/14 1145 09/27/14 0025 09/28/14 1938  NA 132* 134* 135  K 3.5 3.4* 3.8  CL 94* 101 100  CO2 GLUCOSE 154* 166* 148*  BUN <5* 7 <5*  CREATININE 0.80 0.74 0.75  CALCIUM 9.1 9.0 9.7   Liver Function Tests:  Recent Labs Lab 09/26/14 1145  AST 35  ALT 40  ALKPHOS 80  BILITOT 1.1  PROT 7.8  ALBUMIN 4.4   No results for input(s): LIPASE, AMYLASE in the last 168 hours. No results for input(s): AMMONIA in the last 168 hours. CBC:  Recent Labs Lab 09/26/14 1145 09/27/14 0025 09/28/14 1938  WBC 9.3 9.9 9.7  NEUTROABS  --   --  5.5  HGB 18.0* 17.0 17.2*  HCT 48.7 46.3 46.7  MCV 93.1 92.8 91.9  PLT 221 213 205   Cardiac Enzymes:  Recent Labs Lab 09/26/14 1145 09/26/14 2058 09/27/14 0025 09/27/14 0725 09/28/14 1938 09/28/14 2254  CKTOTAL  --  107 91 87  --   --   CKMB  --  2.3 2.9 2.3  --   --   TROPONINI 0.18*  --   --   --  0.13* 0.16*    BNP (last 3 results) No results for input(s): BNP in the last 8760 hours.  ProBNP (last 3 results) No results for input(s): PROBNP in the last 8760 hours.  CBG: No results for input(s): GLUCAP in the last 168 hours.  Radiological Exams on Admission: Dg Chest Port 1 View  09/28/2014    CLINICAL DATA:  Chest pain for 1 day.  EXAM: PORTABLE CHEST - 1 VIEW  COMPARISON:  PA and lateral chest 09/26/2014. Single view of the chest 11/12/2013.  FINDINGS: The lungs are clear. Heart size is normal. There is no pneumothorax or pleural effusion. No focal bony abnormality is identified.  IMPRESSION: Negative chest.   Electronically Signed   By: Drusilla Kanner M.D.   On: 09/28/2014 19:28      Assessment/Plan Active Problems:   Diabetes mellitus, type 2   Hypertension   Chest pain   NSTEMI (non-ST elevated myocardial infarction)   1. Chest Pain -atypical presentation but has recent MI and also positive troponin -will admit for observation -check serial enzymes -cardiology to see -ASA given  2. HTN -will continue his home medications  3. Diabetes Mellitus type 2 -will monitor FSBS -continue his home medications -will check A1C     Code Status: Full Code (must indicate code status--if unknown or must be presumed, indicate so) DVT Prophylaxis:On Full Dose Anticoagulants Family Communication: None (indicate person spoken with, if applicable, with phone number if by telephone) Disposition Plan: Home (indicate anticipated LOS)  Time spent:  [facility] Pager [phone]

## 2014-09-29 NOTE — Discharge Summary (Signed)
Physician Discharge Summary  Clayton KohRichard W Foster JXB:[id number] DOB: [birth year] DOA: 09/28/2014  PCP: MUSE,ROCHELLE D., PA-C  Admit date: 09/28/2014 Discharge date: 09/29/2014  Time spent: 45 minutes  Recommendations for Outpatient Follow-up:  1. Follow up with primary care physician within 1 week.  2. Continue Asprin ticagrelor, lopressor, lisinopril, HCTZ, and lipitor as prescribed.  Discharge Diagnoses:  Principal Problem:   Chest pain Active Problems:   Diabetes mellitus, type 2   Hypertension   NSTEMI (non-ST elevated myocardial infarction)   Discharge Condition: Stable  History of present illness:  Clayton Foster is a 54 yo male with a history of HTN, DM, 2 MIs, hyperlipidemia and a smoking history. He presented to the ED on 3/5 with chest pain and was found to have NSTEMI and continued to have a stent placed that day. He was discharge on 3/6. Patient returned to the ED on 3/7 with similar chest pain, but denies diaphoresis or shortness of breath. He states his GERD and panic attacks are very similar to this chest pain. Cycled troponins were mildly elevated, but decreased on the 3rd set.    Hospital Course:   Chest Pain Chest pain started on 3/7, but has since alleviated.  Cardiology felt this was most likely related to anxiety/panic attacks. 3 troponins were mildly elevated, flat curve which is not consistent with acute coronary syndrome. Troponin elevation could be due to recent MI/stent placement. Given morphine & nitroglycerin paste to relieve pain. Continued Asprin and Ticagrelor due to recent NSTEMI. Checked with cardiology prior to discharge, recommended discharge home   Tachycardia  Probably due to chest pain.  Hypertension  Continued home medications; slightly elevated blood pressure in ED  Diabetes Mellitus  Stable in the low to mid 100s (his baseline); monitored CBGs. Hb A1C from 11/12/13 was 8.2%. Continued home medications  .   Procedures:  None  Consultations:  Cardiology   Discharge Exam: Filed Vitals:   09/29/14 0434 09/29/14 0752 09/29/14 0830 09/29/14 1016  BP: 116/76 123/73 139/83 101/45  Pulse: 86 91 84 104  Temp: 97.8 F (36.6 C)     TempSrc: Oral     Resp: 18 15 21 17   Height: 6\' 3"  (1.905 m)     Weight: 100.699 kg (222 lb)     SpO2: 91% 93% 96% 96%   Filed Weights   09/28/14 1831 09/29/14 0434  Weight: 104.327 kg (230 lb) 100.699 kg (222 lb)    Physical Exam  Constitutional: He appears well-developed and well-nourished.  HENT:  Head: Normocephalic.  Eyes: Pupils are equal, round, and reactive to light.  Cardiovascular: Normal rate.  Exam reveals no gallop and no friction rub.   No murmur heard. Tachycardic  Pulmonary/Chest: Effort normal and breath sounds normal. No respiratory distress. He has no wheezes. He has no rales.  Abdominal: Soft. Bowel sounds are normal. He exhibits no distension. There is no tenderness.  Neurological: He is alert. No cranial nerve deficit.  Skin: Skin is warm and dry.  Psychiatric: He has a normal mood and affect. His behavior is normal. Judgment and thought content normal.     Discharge Instructions  Diet recommendation: Heart Healthy, Carb Modified    Current Discharge Medication List    CONTINUE these medications which have NOT CHANGED   Details  acetaminophen (TYLENOL) 500 MG tablet Take 1,000 mg by mouth daily as needed for mild pain.     ASPERCREME W/LIDOCAINE EX Apply 1 application topically daily as needed (pain).    aspirin EC  81 MG tablet Take 81 mg by mouth every morning.     atorvastatin (LIPITOR) 80 MG tablet Take 1 tablet (80 mg total) by mouth daily at 6 PM. Qty: 30 tablet, Refills: 11    busPIRone (BUSPAR) 15 MG tablet Take 30 mg by mouth at bedtime.     cyclobenzaprine (FLEXERIL) 10 MG tablet Take 10 mg by mouth 3 (three) times daily as needed. Muscle Spasms    Docusate Sodium (DSS) 100 MG CAPS Take 100 mg by mouth  as needed (stool softener).     DULoxetine (CYMBALTA) 30 MG capsule Take 30 mg by mouth daily.    gabapentin (NEURONTIN) 600 MG tablet Take 600 mg by mouth every 6 (six) hours.     glipiZIDE (GLUCOTROL) 10 MG tablet Take 10 mg by mouth daily before breakfast.    metFORMIN (GLUCOPHAGE) 1000 MG tablet Take 1 tablet (1,000 mg total) by mouth 2 (two) times daily. Qty: 60 tablet, Refills: 1    metoprolol tartrate (LOPRESSOR) 25 MG tablet Take 1 tablet (25 mg total) by mouth 2 (two) times daily. Qty: 60 tablet, Refills: 11    nitroGLYCERIN (NITROSTAT) 0.4 MG SL tablet Place 1 tablet (0.4 mg total) under the tongue every 5 (five) minutes as needed for chest pain. Qty: 25 tablet, Refills: 1    pantoprazole (PROTONIX) 40 MG tablet Take 40 mg by mouth daily.    quinapril-hydrochlorothiazide (ACCURETIC) 20-25 MG per tablet Take 1 tablet by mouth every morning. Qty: 30 tablet, Refills: 6    ticagrelor (BRILINTA) 90 MG TABS tablet Take 1 tablet (90 mg total) by mouth 2 (two) times daily. Qty: 180 tablet, Refills: 3       No Known Allergies    The results of significant diagnostics from this hospitalization (including imaging, microbiology, ancillary and laboratory) are listed below for reference.    Significant Diagnostic Studies: Dg Chest 2 View  09/26/2014   CLINICAL DATA:  Midsternal chest pain for 2 days. Hypertension. Diabetes. Smoker. Prior cardiac stents.  EXAM: CHEST  2 VIEW  COMPARISON:  11/12/2013  FINDINGS: Midline trachea. Normal heart size and mediastinal contours. No pleural effusion or pneumothorax. Diffuse peribronchial thickening. Clear lungs.  IMPRESSION: 1.  No acute cardiopulmonary disease. 2. Mild peribronchial thickening which may relate to chronic bronchitis or smoking.   Electronically Signed   By: Jeronimo Greaves M.D.   On: 09/26/2014 13:47   Dg Chest Port 1 View  09/28/2014   CLINICAL DATA:  Chest pain for 1 day.  EXAM: PORTABLE CHEST - 1 VIEW  COMPARISON:  PA and  lateral chest 09/26/2014. Single view of the chest 11/12/2013.  FINDINGS: The lungs are clear. Heart size is normal. There is no pneumothorax or pleural effusion. No focal bony abnormality is identified.  IMPRESSION: Negative chest.   Electronically Signed   By: Drusilla Kanner M.D.   On: 09/28/2014 19:28    Microbiology: Recent Results (from the past 240 hour(s))  MRSA PCR Screening     Status: None   Collection Time: 09/26/14  7:03 PM  Result Value Ref Range Status   MRSA by PCR NEGATIVE NEGATIVE Final    Comment:        The GeneXpert MRSA Assay (FDA approved for NASAL specimens only), is one component of a comprehensive MRSA colonization surveillance program. It is not intended to diagnose MRSA infection nor to guide or monitor treatment for MRSA infections.      Labs: Basic Metabolic Panel:  Recent Labs Lab 09/26/14  1145 09/27/14 0025 09/28/14 1938  NA 132* 134* 135  K 3.5 3.4* 3.8  CL 94* 101 100  CO2 GLUCOSE 154* 166* 148*  BUN <5* 7 <5*  CREATININE 0.80 0.74 0.75  CALCIUM 9.1 9.0 9.7   Liver Function Tests:  Recent Labs Lab 09/26/14 1145  AST 35  ALT 40  ALKPHOS 80  BILITOT 1.1  PROT 7.8  ALBUMIN 4.4   CBC:  Recent Labs Lab 09/26/14 1145 09/27/14 0025 09/28/14 1938  WBC 9.3 9.9 9.7  NEUTROABS  --   --  5.5  HGB 18.0* 17.0 17.2*  HCT 48.7 46.3 46.7  MCV 93.1 92.8 91.9  PLT 221 213 205   Cardiac Enzymes:  Recent Labs Lab 09/26/14 1145 09/26/14 2058 09/27/14 0025 09/27/14 0725 09/28/14 1938 09/28/14 2254 09/29/14 0555  CKTOTAL  --  107 91 87  --   --   --   CKMB  --  2.3 2.9 2.3  --   --   --   TROPONINI 0.18*  --   --   --  0.13* 0.16* 0.12*    CBG:  Recent Labs Lab 09/29/14 0432 09/29/14 0731  GLUCAP 151* 141*       Signed:  Jeannetta Ellis, PA-S [facility] 09/29/2014, 10:43 AM   .Addendum  Patient seen and examined, chart and data base reviewed.  I agree with the above assessment and  plan.  For full details please see Mrs. Jeannetta Ellis, PA-S note.  I reviewed and amended the above note and as appropriate.    Clint Lipps, MD [facility] Pager: [phone] 09/29/2014, 1:30 PM

## 2014-09-29 NOTE — Progress Notes (Signed)
Hypoglycemic Event  CBG: 52  Treatment: 15 GM carbohydrate snack  Symptoms: Sweaty and Nervous/irritable  Follow-up CBG: Time:1145 CBG Result:136  Possible Reasons for Event: Medication regimen: 2U of Insulin  Comments/MD notified:[name]    Clayton Foster  Remember to initiate Hypoglycemia Order Set & complete

## 2014-09-29 NOTE — ED Notes (Signed)
Report given to Shanda BumpsJessica, RN on 3W.

## 2014-09-30 LAB — POCT ACTIVATED CLOTTING TIME
Activated Clotting Time: 300 seconds
Activated Clotting Time: 380 seconds

## 2014-09-30 LAB — HEMOGLOBIN A1C
HEMOGLOBIN A1C: 8.4 % — AB (ref 4.8–5.6)
Mean Plasma Glucose: 194 mg/dL

## 2014-10-05 ENCOUNTER — Ambulatory Visit (INDEPENDENT_AMBULATORY_CARE_PROVIDER_SITE_OTHER): Payer: Medicaid Other | Admitting: Adult Health

## 2014-10-05 ENCOUNTER — Encounter: Payer: Self-pay | Admitting: Adult Health

## 2014-10-05 VITALS — BP 138/76 | HR 68 | Ht 75.0 in | Wt 228.4 lb

## 2014-10-05 DIAGNOSIS — F419 Anxiety disorder, unspecified: Secondary | ICD-10-CM

## 2014-10-05 DIAGNOSIS — I10 Essential (primary) hypertension: Secondary | ICD-10-CM

## 2014-10-05 DIAGNOSIS — I25701 Atherosclerosis of coronary artery bypass graft(s), unspecified, with angina pectoris with documented spasm: Secondary | ICD-10-CM

## 2014-10-05 NOTE — Progress Notes (Signed)
Cardiology Office Note   Date:  10/05/2014   ID:  Clayton LevyRichard W Mick, DOB 1960-10-10, MRN [id number]  PCP:  Tylene FantasiaMUSE,ROCHELLE D., PA-C  Cardiologist:  Raphael GibneyMcLean Layani Foronda, NP   Chief Complaint  Patient presents with  . Chest Pain  . Hypertension      History of Present Illness: Clayton Foster is a 54 y.o. male who presents for post hospitalization followup remission for chest pain.  He has a history of CAD, On 09/26/2014, with patent LAD stents, drug-eluting stent to the mid LAD with 80%, RCA stenosis, treated with balloon angioplasty.  Hypertension, diabetes, chronic back pain, and anxiety.  He was sent home on aspirin and Tricagrelor, and had a non-ST elevated MI.   He comes today feeling much better, but still continues to have a good deal of anxiety.  He has some questions concerning his heart catheterization.  He also requests rereferral to a counselor/psychiatry for ongoing anxiety management.   Past Medical History  Diagnosis Date  . Anxiety   . Hypertension   . Diabetes mellitus   . Chronic back pain   . Chronic neck pain   . Coronary artery disease     a. 2000: s/p PCI/stenting of prox LAD and RCA @ [facility];  b. 09/2012 Cath/PCI: LM 30d, LAD 40p ISR, 8156m, 654m/d (2.25x20 Promus Premier DES), LCX 30p, RCA 80p (3.0x24 Promus Premier DES), patent mid stent, 3056m, PDA 8166m (2.75x28 Promus Premier DES), EF 60-65%. c. cath 09/26/2014 EF 65%, patent LAD stents, DES 2.5 x 12 Synergy to mid LAD, 80% RCA stenosis treated with balloon angio  . Hyperlipidemia   . Tobacco abuse     Past Surgical History  Procedure Laterality Date  . Back surgery    . Appendectomy    . Tonsillectomy    . Cardiac stents    . Left heart catheterization with coronary angiogram N/A 10/11/2012    Procedure: LEFT HEART CATHETERIZATION WITH CORONARY ANGIOGRAM;  Surgeon: Laurey Moralealton S McLean, MD;  [facility];  Service: Cardiovascular;  Laterality: N/A;  . Percutaneous coronary stent intervention (pci-s) N/A  10/14/2012    Procedure: PERCUTANEOUS CORONARY STENT INTERVENTION (PCI-S);  Surgeon: Tonny BollmanMichael Cooper, MD;  [facility];  Service: Cardiovascular;  Laterality: N/A;  . Left heart catheterization with coronary angiogram N/A 09/26/2014    Procedure: LEFT HEART CATHETERIZATION WITH CORONARY ANGIOGRAM;  Surgeon: Corky CraftsJayadeep S Varanasi, MD;  [facility];  Service: Cardiovascular;  Laterality: N/A;  . Percutaneous coronary stent intervention (pci-s)  09/26/2014    Procedure: PERCUTANEOUS CORONARY STENT INTERVENTION (PCI-S);  Surgeon: Corky CraftsJayadeep S Varanasi, MD;  [facility];  Service: Cardiovascular;;     Current Outpatient Prescriptions  Medication Sig Dispense Refill  . acetaminophen (TYLENOL) 500 MG tablet Take 1,000 mg by mouth daily as needed for mild pain.     . ASPERCREME W/LIDOCAINE EX Apply 1 application topically daily as needed (pain).    Marland Kitchen. aspirin EC 81 MG tablet Take 81 mg by mouth every morning.     Marland Kitchen. atorvastatin (LIPITOR) 80 MG tablet Take 1 tablet (80 mg total) by mouth daily at 6 PM. 30 tablet 11  . busPIRone (BUSPAR) 15 MG tablet Take 30 mg by mouth at bedtime.     . cyclobenzaprine (FLEXERIL) 10 MG tablet Take 10 mg by mouth 3 (three) times daily as needed. Muscle Spasms    . Docusate Sodium (DSS) 100 MG CAPS Take 100 mg by mouth as needed (stool softener).     .Marland Kitchen  DULoxetine (CYMBALTA) 30 MG capsule Take 30 mg by mouth daily.    Marland Kitchen gabapentin (NEURONTIN) 600 MG tablet Take 600 mg by mouth every 6 (six) hours.     Marland Kitchen glipiZIDE (GLUCOTROL) 10 MG tablet Take 10 mg by mouth daily before breakfast.    . metFORMIN (GLUCOPHAGE) 1000 MG tablet Take 1 tablet (1,000 mg total) by mouth 2 (two) times daily. 60 tablet 1  . metoprolol tartrate (LOPRESSOR) 25 MG tablet Take 1 tablet (25 mg total) by mouth 2 (two) times daily. 60 tablet 11  . nitroGLYCERIN (NITROSTAT) 0.4 MG SL tablet Place 1 tablet (0.4 mg total) under the tongue every 5 (five) minutes as needed for chest pain. 25  tablet 1  . pantoprazole (PROTONIX) 40 MG tablet Take 40 mg by mouth daily.    . quinapril-hydrochlorothiazide (ACCURETIC) 20-25 MG per tablet Take 1 tablet by mouth every morning. 30 tablet 6  . ticagrelor (BRILINTA) 90 MG TABS tablet Take 1 tablet (90 mg total) by mouth 2 (two) times daily. 180 tablet 3   No current facility-administered medications for this visit.    Allergies:   Review of patient's allergies indicates no known allergies.    Social History:  The patient  reports that he has been smoking Cigarettes.  He started smoking about 37 years ago. He has been smoking about 0.75 packs per day. He has quit using smokeless tobacco. He reports that he does not drink alcohol or use illicit drugs.   Family History:  The patient's family history includes Hypertension in his father.    ROS: .   All other systems are reviewed and negative.Unless otherwise mentioned in H&P above.   PHYSICAL EXAM: VS:  BP 138/76 mmHg  Pulse 68  Ht  (1.905 m)  Wt 228 lb 6.4 oz (103.602 kg)  BMI 28.55 kg/m2  SpO2 99% , BMI Body mass index is 28.55 kg/(m^2). GEN: Well nourished, well developed, in no acute distress HEENT: normal Neck: no JVD, carotid bruits, or masses Cardiac RRR; no murmurs, rubs, or gallops,no edema  Respiratory:  clear to auscultation bilaterally, normal work of breathing GI: soft, nontender, nondistended, + BS MS: no deformity or atrophy Skin: warm and dry, no rash Neuro:  Strength and sensation are intact Psych: euthymic mood, full affect  Recent Labs: 09/26/2014: ALT 40 09/28/2014: BUN <5*; Creatinine 0.75; Hemoglobin 17.2*; Platelets 205; Potassium 3.8; Sodium 135    Lipid Panel    Component Value Date/Time   CHOL 187 10/10/2012 1036   TRIG 173* 10/10/2012 1036   HDL 27* 10/10/2012 1036   CHOLHDL 6.9 10/10/2012 1036   VLDL 35 10/10/2012 1036   LDLCALC 125* 10/10/2012 1036      Wt Readings from Last 3 Encounters:  10/05/14 228 lb 6.4 oz (103.602 kg)   09/29/14 222 lb (100.699 kg)  09/27/14 223 lb 1.7 oz (101.2 kg)      Other studies Reviewed: Additional studies/ records that were reviewed today include: Cardiac catheterization, echocardiogram   ASSESSMENT AND PLAN:  1. CAD: Status post hospitalization for non-ST elevation MI.  Cardiac catheterization on 09/26/2014, revealing an EF of 65%, patent LAD stents, a drug-eluting stent was placed to the mid LAD.  He had residual 80% RCA stenosis, which was treated with balloon angioplasty.  He is tolerating his medications well and cannot afford them.  I referring him to cardiac rehabilitation for ongoing support of his cardiac disease, education reinforcement  and socialization.He will be seen again in 3 months  unless symptomatic  2. Hypertension: blood pressure is well-controlled currently.  He will continue metoprolol, ACE inhibitor, with HCTZ.  We will see him again in 3 months unless symptomatic.  3.Anxiety: I have referred him to [facility] for counseling and psychiatric management of his medications.  4. Tobacco Abuse: he unfortunately, continues to smoke.  He uses this is a stress reducer.  I have explained to him the dangers of ongoing tobacco abuse, and recurrence of MI, and progression of heart disease.  He verbalizes understanding.  Current medicines are reviewed at length with the patient today.      Orders Placed This Encounter  Procedures  . AMB referral to cardiac rehabilitation     Disposition:   FU with 3 months   Signed, Joni Reining, NP  10/05/2014 2:25 PM    [facility] [address] [place], [place] [address] Phone: [phone]; Fax: [phone]

## 2014-10-05 NOTE — Addendum Note (Signed)
Addended by: Kerney ElbePINNIX, Kearsten Ginther G on: 10/05/2014 03:09 PM   Modules accepted: Level of Service

## 2014-10-05 NOTE — Progress Notes (Deleted)
Name: Clayton Foster    DOB: May 14, 1961  Age: 54 y.o.  MR#: [id number]       PCP:  Tylene Fantasia., PA-C      Insurance: Payor: MEDICAID [place] / Plan: MEDICAID [place] ACCESS / Product Type: *No Product type* /   CC:    Chief Complaint  Patient presents with  . Chest Pain  . Hypertension    VS Filed Vitals:   10/05/14 1352  BP: 138/76  Pulse: 68  Height:  (1.905 m)  Weight: 228 lb 6.4 oz (103.602 kg)  SpO2: 99%    Weights Current Weight  10/05/14 228 lb 6.4 oz (103.602 kg)  09/29/14 222 lb (100.699 kg)  09/27/14 223 lb 1.7 oz (101.2 kg)    Blood Pressure  BP Readings from Last 3 Encounters:  10/05/14 138/76  09/29/14 118/62  09/27/14 132/73     Admit date:  (Not on file) Last encounter with RMR:  Visit date not found   Allergy Review of patient's allergies indicates no known allergies.  Current Outpatient Prescriptions  Medication Sig Dispense Refill  . acetaminophen (TYLENOL) 500 MG tablet Take 1,000 mg by mouth daily as needed for mild pain.     . ASPERCREME W/LIDOCAINE EX Apply 1 application topically daily as needed (pain).    Marland Kitchen aspirin EC 81 MG tablet Take 81 mg by mouth every morning.     Marland Kitchen atorvastatin (LIPITOR) 80 MG tablet Take 1 tablet (80 mg total) by mouth daily at 6 PM. 30 tablet 11  . busPIRone (BUSPAR) 15 MG tablet Take 30 mg by mouth at bedtime.     . cyclobenzaprine (FLEXERIL) 10 MG tablet Take 10 mg by mouth 3 (three) times daily as needed. Muscle Spasms    . Docusate Sodium (DSS) 100 MG CAPS Take 100 mg by mouth as needed (stool softener).     . DULoxetine (CYMBALTA) 30 MG capsule Take 30 mg by mouth daily.    Marland Kitchen gabapentin (NEURONTIN) 600 MG tablet Take 600 mg by mouth every 6 (six) hours.     Marland Kitchen glipiZIDE (GLUCOTROL) 10 MG tablet Take 10 mg by mouth daily before breakfast.    . metFORMIN (GLUCOPHAGE) 1000 MG tablet Take 1 tablet (1,000 mg total) by mouth 2 (two) times daily. 60 tablet 1  . metoprolol tartrate (LOPRESSOR) 25 MG tablet Take 1  tablet (25 mg total) by mouth 2 (two) times daily. 60 tablet 11  . nitroGLYCERIN (NITROSTAT) 0.4 MG SL tablet Place 1 tablet (0.4 mg total) under the tongue every 5 (five) minutes as needed for chest pain. 25 tablet 1  . pantoprazole (PROTONIX) 40 MG tablet Take 40 mg by mouth daily.    . quinapril-hydrochlorothiazide (ACCURETIC) 20-25 MG per tablet Take 1 tablet by mouth every morning. 30 tablet 6  . ticagrelor (BRILINTA) 90 MG TABS tablet Take 1 tablet (90 mg total) by mouth 2 (two) times daily. 180 tablet 3   No current facility-administered medications for this visit.    Discontinued Meds:   There are no discontinued medications.  Patient Active Problem List   Diagnosis Date Noted  . NSTEMI (non-ST elevated myocardial infarction) 09/26/2014  . Hyponatremia 10/10/2012  . Chest pain 07/22/2012  . CAD (coronary artery disease) 07/22/2012  . Panic disorder 07/22/2012  . Tobacco use 07/22/2012  . GERD (gastroesophageal reflux disease) 07/22/2012  . Diabetes mellitus, type 2 09/28/2011  . Hypertension 09/28/2011  . High cholesterol 09/28/2011  . Back pain with radiculopathy 09/28/2011  LABS    Component Value Date/Time   NA 135 09/28/2014 1938   NA 134* 09/27/2014 0025   NA 132* 09/26/2014 1145   K 3.8 09/28/2014 1938   K 3.4* 09/27/2014 0025   K 3.5 09/26/2014 1145   CL 100 09/28/2014 1938   CL 101 09/27/2014 0025   CL 94* 09/26/2014 1145   CO2 23 09/28/2014 1938   CO2 22 09/27/2014 0025   CO2 23 09/26/2014 1145   GLUCOSE 148* 09/28/2014 1938   GLUCOSE 166* 09/27/2014 0025   GLUCOSE 154* 09/26/2014 1145   BUN <5* 09/28/2014 1938   BUN 7 09/27/2014 0025   BUN <5* 09/26/2014 1145   CREATININE 0.75 09/28/2014 1938   CREATININE 0.74 09/27/2014 0025   CREATININE 0.80 09/26/2014 1145   CALCIUM 9.7 09/28/2014 1938   CALCIUM 9.0 09/27/2014 0025   CALCIUM 9.1 09/26/2014 1145   GFRNONAA >90 09/28/2014 1938   GFRNONAA >90 09/27/2014 0025   GFRNONAA >90 09/26/2014 1145    GFRAA >90 09/28/2014 1938   GFRAA >90 09/27/2014 0025   GFRAA >90 09/26/2014 1145   CMP     Component Value Date/Time   NA 135 09/28/2014 1938   K 3.8 09/28/2014 1938   CL 100 09/28/2014 1938   CO2 23 09/28/2014 1938   GLUCOSE 148* 09/28/2014 1938   BUN <5* 09/28/2014 1938   CREATININE 0.75 09/28/2014 1938   CALCIUM 9.7 09/28/2014 1938   PROT 7.8 09/26/2014 1145   ALBUMIN 4.4 09/26/2014 1145   AST 35 09/26/2014 1145   ALT 40 09/26/2014 1145   ALKPHOS 80 09/26/2014 1145   BILITOT 1.1 09/26/2014 1145   GFRNONAA >90 09/28/2014 1938   GFRAA >90 09/28/2014 1938       Component Value Date/Time   WBC 9.7 09/28/2014 1938   WBC 9.9 09/27/2014 0025   WBC 9.3 09/26/2014 1145   HGB 17.2* 09/28/2014 1938   HGB 17.0 09/27/2014 0025   HGB 18.0* 09/26/2014 1145   HCT 46.7 09/28/2014 1938   HCT 46.3 09/27/2014 0025   HCT 48.7 09/26/2014 1145   MCV 91.9 09/28/2014 1938   MCV 92.8 09/27/2014 0025   MCV 93.1 09/26/2014 1145    Lipid Panel     Component Value Date/Time   CHOL 187 10/10/2012 1036   TRIG 173* 10/10/2012 1036   HDL 27* 10/10/2012 1036   CHOLHDL 6.9 10/10/2012 1036   VLDL 35 10/10/2012 1036   LDLCALC 125* 10/10/2012 1036    ABG No results found for: PHART, PCO2ART, PO2ART, HCO3, TCO2, ACIDBASEDEF, O2SAT   No results found for: TSH BNP (last 3 results) No results for input(s): BNP in the last 8760 hours.  ProBNP (last 3 results) No results for input(s): PROBNP in the last 8760 hours.  Cardiac Panel (last 3 results) No results for input(s): CKTOTAL, CKMB, TROPONINI, RELINDX in the last 72 hours.  Iron/TIBC/Ferritin/ %Sat No results found for: IRON, TIBC, FERRITIN, IRONPCTSAT   EKG Orders placed or performed during the hospital encounter of 09/28/14  . EKG 12-Lead  . EKG 12-Lead  . EKG 12-Lead  . EKG 12-Lead  . EKG 12-Lead  . EKG 12-Lead  . EKG  . EKG     Prior Assessment and Plan Problem List as of 10/05/2014      Cardiovascular and Mediastinum    Hypertension   Last Assessment & Plan 01/29/2014 Office Visit Written 01/29/2014  2:45 PM by Jodelle Gross, NP    Excellent control of blood pressure at  this time. No changes in his medication regimen. We will see him again in 4 months postoperatively unless he is to see [place] sooner.      CAD (coronary artery disease)   Last Assessment & Plan 01/29/2014 Office Visit Written 01/29/2014  2:44 PM by Jodelle Gross, NP    The patient had a repeat cardiac stress test completed in April 2015 is found to be negative for ischemia. The patient had normal LV systolic function, and was found a low risk study for any major cardiac events. EKG is completed during this office visit revealing normal sinus rhythm without evidence of ischemia AV blocks or arrhythmias. PR interval 0.136 milliseconds QT 404, with a QTC of 420 ms. Blood pressure is excellently controlled.  The patient is clear from a cardiac standpoint to proceed with back surgery. Would continue beta blockers metoprolol 25 mg twice a day, and aspirin perioperatively along with quinapril HCTZ for blood pressure control. A copy of the stress test and EKG will be sent with a letter to Dr. Laurine Blazer, with a copy to the patient, for documentation that the patient can proceed with surgery.      NSTEMI (non-ST elevated myocardial infarction)     Digestive   GERD (gastroesophageal reflux disease)     Endocrine   Diabetes mellitus, type 2     Other   High cholesterol   Last Assessment & Plan 01/29/2014 Office Visit Written 01/29/2014  2:45 PM by Jodelle Gross, NP    Continue statin therapy with atorvastatin 40 mg daily. Remain on low cholesterol diet.      Back pain with radiculopathy   Last Assessment & Plan 02/21/2013 Office Visit Written 02/21/2013  1:28 PM by Jodelle Gross, NP    Surgery is planned for Sept or Oct. He will make appt when surgery date is set for pre-operative evaluation.      Chest pain   Panic disorder   Last Assessment &  Plan 02/21/2013 Office Visit Written 02/21/2013  1:26 PM by Jodelle Gross, NP    He talks today about having more panic attacks more often the last few days. He will be seeing psychiatrist this month.      Tobacco use   Last Assessment & Plan 10/22/2012 Office Visit Written 10/22/2012  2:20 PM by Jodelle Gross, NP    This has been discussed, he is cutting down on his cigarette use. He has a history of anxiety and uses this to help calm himself down. I explained the risks associated with continued smoking and recurrence of her artery stenosis. He verbalizes understanding.      Hyponatremia       Imaging: Dg Chest 2 View  09/26/2014   CLINICAL DATA:  Midsternal chest pain for 2 days. Hypertension. Diabetes. Smoker. Prior cardiac stents.  EXAM: CHEST  2 VIEW  COMPARISON:  11/12/2013  FINDINGS: Midline trachea. Normal heart size and mediastinal contours. No pleural effusion or pneumothorax. Diffuse peribronchial thickening. Clear lungs.  IMPRESSION: 1.  No acute cardiopulmonary disease. 2. Mild peribronchial thickening which may relate to chronic bronchitis or smoking.   Electronically Signed   By: Jeronimo Greaves M.D.   On: 09/26/2014 13:47   Dg Chest Port 1 View  09/28/2014   CLINICAL DATA:  Chest pain for 1 day.  EXAM: PORTABLE CHEST - 1 VIEW  COMPARISON:  PA and lateral chest 09/26/2014. Single view of the chest 11/12/2013.  FINDINGS: The lungs are clear. Heart size is  normal. There is no pneumothorax or pleural effusion. No focal bony abnormality is identified.  IMPRESSION: Negative chest.   Electronically Signed   By: Drusilla Kannerhomas  Dalessio M.D.   On: 09/28/2014 19:28

## 2014-10-05 NOTE — Patient Instructions (Signed)
Your physician recommends that you schedule a follow-up appointment in:  3 months Joni ReiningKathryn Lawrence, NP.   You have been referred to Cardiac Rehab  Your physician recommends that you continue on your current medications as directed. Please refer to the Current Medication list given to you today.  Thank you for choosing [facility]!

## 2014-10-13 ENCOUNTER — Encounter (HOSPITAL_COMMUNITY): Payer: Medicaid Other

## 2014-10-15 ENCOUNTER — Encounter (HOSPITAL_COMMUNITY): Admission: RE | Admit: 2014-10-15 | Payer: Medicaid Other | Source: Ambulatory Visit

## 2014-11-24 ENCOUNTER — Encounter (INDEPENDENT_AMBULATORY_CARE_PROVIDER_SITE_OTHER): Payer: Self-pay | Admitting: *Deleted

## 2014-12-07 ENCOUNTER — Encounter (INDEPENDENT_AMBULATORY_CARE_PROVIDER_SITE_OTHER): Payer: Self-pay | Admitting: *Deleted

## 2014-12-07 ENCOUNTER — Other Ambulatory Visit (INDEPENDENT_AMBULATORY_CARE_PROVIDER_SITE_OTHER): Payer: Self-pay | Admitting: *Deleted

## 2014-12-07 DIAGNOSIS — Z1211 Encounter for screening for malignant neoplasm of colon: Secondary | ICD-10-CM

## 2014-12-07 DIAGNOSIS — Z800 Family history of malignant neoplasm of digestive organs: Secondary | ICD-10-CM

## 2015-01-11 ENCOUNTER — Encounter (HOSPITAL_COMMUNITY): Payer: Self-pay | Admitting: *Deleted

## 2015-01-11 ENCOUNTER — Encounter (HOSPITAL_COMMUNITY)
Admission: RE | Admit: 2015-01-11 | Discharge: 2015-01-11 | Disposition: A | Discharge status: Home / Self Care | Payer: Medicaid Other | Source: Ambulatory Visit | Attending: Oral Surgery | Admitting: Oral Surgery

## 2015-01-11 DIAGNOSIS — K219 Gastro-esophageal reflux disease without esophagitis: Secondary | ICD-10-CM | POA: Diagnosis not present

## 2015-01-11 DIAGNOSIS — E119 Type 2 diabetes mellitus without complications: Secondary | ICD-10-CM | POA: Insufficient documentation

## 2015-01-11 DIAGNOSIS — Z951 Presence of aortocoronary bypass graft: Secondary | ICD-10-CM | POA: Diagnosis not present

## 2015-01-11 DIAGNOSIS — E785 Hyperlipidemia, unspecified: Secondary | ICD-10-CM | POA: Diagnosis not present

## 2015-01-11 DIAGNOSIS — K089 Disorder of teeth and supporting structures, unspecified: Secondary | ICD-10-CM | POA: Insufficient documentation

## 2015-01-11 DIAGNOSIS — F17200 Nicotine dependence, unspecified, uncomplicated: Secondary | ICD-10-CM | POA: Insufficient documentation

## 2015-01-11 DIAGNOSIS — I2510 Atherosclerotic heart disease of native coronary artery without angina pectoris: Secondary | ICD-10-CM | POA: Insufficient documentation

## 2015-01-11 DIAGNOSIS — K0530 Chronic periodontitis, unspecified: Secondary | ICD-10-CM | POA: Diagnosis not present

## 2015-01-11 DIAGNOSIS — G709 Myoneural disorder, unspecified: Secondary | ICD-10-CM | POA: Diagnosis not present

## 2015-01-11 DIAGNOSIS — G8929 Other chronic pain: Secondary | ICD-10-CM | POA: Diagnosis not present

## 2015-01-11 DIAGNOSIS — Z955 Presence of coronary angioplasty implant and graft: Secondary | ICD-10-CM | POA: Insufficient documentation

## 2015-01-11 DIAGNOSIS — Z79899 Other long term (current) drug therapy: Secondary | ICD-10-CM | POA: Insufficient documentation

## 2015-01-11 DIAGNOSIS — I252 Old myocardial infarction: Secondary | ICD-10-CM | POA: Diagnosis not present

## 2015-01-11 DIAGNOSIS — M1990 Unspecified osteoarthritis, unspecified site: Secondary | ICD-10-CM | POA: Diagnosis not present

## 2015-01-11 DIAGNOSIS — Z01812 Encounter for preprocedural laboratory examination: Secondary | ICD-10-CM | POA: Insufficient documentation

## 2015-01-11 DIAGNOSIS — Z7982 Long term (current) use of aspirin: Secondary | ICD-10-CM | POA: Diagnosis not present

## 2015-01-11 DIAGNOSIS — I10 Essential (primary) hypertension: Secondary | ICD-10-CM | POA: Diagnosis not present

## 2015-01-11 DIAGNOSIS — F419 Anxiety disorder, unspecified: Secondary | ICD-10-CM | POA: Diagnosis not present

## 2015-01-11 DIAGNOSIS — Z9861 Coronary angioplasty status: Secondary | ICD-10-CM | POA: Diagnosis not present

## 2015-01-11 DIAGNOSIS — K029 Dental caries, unspecified: Secondary | ICD-10-CM | POA: Diagnosis not present

## 2015-01-11 DIAGNOSIS — M542 Cervicalgia: Secondary | ICD-10-CM | POA: Diagnosis not present

## 2015-01-11 LAB — BASIC METABOLIC PANEL
Anion gap: 10 (ref 5–15)
BUN: 6 mg/dL (ref 6–20)
CHLORIDE: 94 mmol/L — AB (ref 101–111)
CO2: 25 mmol/L (ref 22–32)
CREATININE: 0.83 mg/dL (ref 0.61–1.24)
Calcium: 9.9 mg/dL (ref 8.9–10.3)
GFR calc Af Amer: >60 mL/min (ref >60)
GFR calc non Af Amer: >60 mL/min (ref >60)
GLUCOSE: 127 mg/dL — AB (ref 65–99)
Potassium: 4.4 mmol/L (ref 3.5–5.1)
Sodium: 129 mmol/L — ABNORMAL LOW (ref 135–145)

## 2015-01-11 LAB — GLUCOSE, CAPILLARY: Glucose-Capillary: 122 mg/dL — ABNORMAL HIGH (ref 65–99)

## 2015-01-11 LAB — CBC
HEMATOCRIT: 41.9 % (ref 39.0–52.0)
HEMOGLOBIN: 15.7 g/dL (ref 13.0–17.0)
MCH: 33.7 pg (ref 26.0–34.0)
MCHC: >37 g/dL — ABNORMAL HIGH (ref 30.0–36.0)
MCV: 89.9 fL (ref 78.0–100.0)
Platelets: 274 10*3/uL (ref 150–400)
RBC: 4.66 MIL/uL (ref 4.22–5.81)
RDW: 12.1 % (ref 11.5–15.5)
WBC: 8.2 10*3/uL (ref 4.0–10.5)

## 2015-01-11 NOTE — Progress Notes (Signed)
   01/11/15 1017  OBSTRUCTIVE SLEEP APNEA  Have you ever been diagnosed with sleep apnea through a sleep study? No  Do you snore loudly (loud enough to be heard through closed doors)?  0  Do you often feel tired, fatigued, or sleepy during the daytime? 1  Has anyone observed you stop breathing during your sleep? 0  Do you have, or are you being treated for high blood pressure? 1  BMI more than 35 kg/m2? 0  Age over 53 years old? 1  Gender: 1

## 2015-01-11 NOTE — Pre-Procedure Instructions (Signed)
Clayton Foster  01/11/2015      [facility] - [place], [place] - [address] [name] [address] Phone: [phone] Fax: [phone]    Your procedure is scheduled on July 1  Report to [facility] Admitting at [employer].  Call this number if you have problems the morning of surgery:  [phone]   Remember:  Do not eat food or drink liquids after midnight.  Take these medicines the morning of surgery with A SIP OF WATER Tylenol if needed,   Gabapentin (Neurontin), Metoprolol tartrate (Lopreesor), Nitroglycerin if needed, Protonix, cetirizine (Zyrtec) if needed, tizanidine (Zanaflex) Stop Brilinta as directed by your Dr.    Stop taking Aspirin, Ibuprofen, Aleve, BC's, Goody's, Herbal medications, Fish Oil   Do not wear jewelry, make-up or nail polish.  Do not wear lotions, powders, or perfumes.  You may wear deodorant.  Do not shave 48 hours prior to surgery.  Men may shave face and neck.  Do not bring valuables to the hospital.  [facility] is not responsible for any belongings or valuables.  Contacts, dentures or bridgework may not be worn into surgery.  Leave your suitcase in the car.  After surgery it may be brought to your room.  For patients admitted to the hospital, discharge time will be determined by your treatment team.  Patients discharged the day of surgery will not be allowed to drive home.    Special instructions:  [facility] - Preparing for Surgery  Before surgery, you can play an important role.  Because skin is not sterile, your skin needs to be as free of germs as possible.  You can reduce the number of germs on you skin by washing with CHG (chlorahexidine gluconate) soap before surgery.  CHG is an antiseptic cleaner which kills germs and bonds with the skin to continue killing germs even after washing.  Please DO NOT use if you have an allergy to CHG or antibacterial soaps.  If your skin becomes  reddened/irritated stop using the CHG and inform your nurse when you arrive at Short Stay.  Do not shave (including legs and underarms) for at least 48 hours prior to the first CHG shower.  You may shave your face.  Please follow these instructions carefully:   1.  Shower with CHG Soap the night before surgery and the  morning of Surgery.  2.  If you choose to wash your hair, wash your hair first as usual with your normal shampoo.  3.  After you shampoo, rinse your hair and body thoroughly to remove the Shampoo.  4.  Use CHG as you would any other liquid soap.  You can apply chg directly  to the skin and wash gently with scrungie or a clean washcloth.  5.  Apply the CHG Soap to your body ONLY FROM THE NECK DOWN.   Do not use on open wounds or open sores.  Avoid contact with your eyes, ears, mouth and genitals (private parts).  Wash genitals (private parts)    with your normal soap.  6.  Wash thoroughly, paying special attention to the area where your surgery will be performed.  7.  Thoroughly rinse your body with warm water from the neck down.  8.  DO NOT shower/wash with your normal soap after using and rinsing off       the CHG Soap.  9.  Pat yourself dry with a clean towel.  10.  Wear clean pajamas.            11.  Place clean sheets on your bed the night of your first shower and do not        sleep with pets.  Day of Surgery  Do not apply any lotions/deoderants the morning of surgery.  Please wear clean clothes to the hospital/surgery center.     Please read over the following fact sheets that you were given. Pain Booklet, Coughing and Deep Breathing and Surgical Site Infection Prevention

## 2015-01-11 NOTE — Progress Notes (Signed)
PCP is Ferdie Ping, [place] Cardiologist is Adolph Pollack Muscoda. Echo noted 09-27-14 Stress test 11-12-13 Pt states he had stents placed in march 2016 He takes Brilinta, and states he thinks he is to stop it 5 days before his surgery, but he states he will ask the Dr about that.

## 2015-01-12 LAB — HEMOGLOBIN A1C
Hgb A1c MFr Bld: 6.8 % — ABNORMAL HIGH (ref 4.8–5.6)
Mean Plasma Glucose: 148 mg/dL

## 2015-01-12 NOTE — Progress Notes (Addendum)
Anesthesia Chart Review:  Pt is 54 year old male scheduled for multiple dental extractions on 01/22/2015 with Dr. Barbette Merino.   Cardiologist is Dr. Shirlee Latch, last office visit 10/05/14 with Harriet Pho, NP.   PMH includes: CAD (a. 2000: s/p PCI/stenting of prox LAD and RCA @ [facility]; b. 09/2012 Cath/PCI: LM 30d, LAD 40p ISR, 62m, 2m/d (DES), LCX 30p, RCA 80p (DES), patent mid stent, 80m, PDA 25m (DES), EF 60-65%. c. cath 09/26/2014 EF 65%, patent LAD stents, DES to mid LAD, 80% RCA stenosis treated with balloon angio), HTN, DM, MI, hyperlipidemia, dysrhythmia, GERD. Current smoker. BMI 31.  Medications include: ASA, lipitor, lisinopril-hctz, metformin, metoprolol, protonix, brilinta. Pt has permission to stop Brilinta 5 days prior to procedure; pt is to restart immediately after procedure.   Preoperative labs reviewed.    Chest x-ray 09/26/2014 reviewed.  1. No acute cardiopulmonary disease. 2. Mild peribronchial thickening which may relate to chronic bronchitis or smoking.  EKG 09/29/2014: NSR. LAFB. T wave abnormality, consider anterior ischemia. Prolonged QT. Non-specific intra-ventricular conduction delay. Septal MI (old or age indeterminate  Cardiac cath 09/26/2014: 1. Normal left main coronary artery. 2. Patent stents in the proximal and mid left anterior descending artery with mild in-stent restenosis. To note the lesion in the mid LAD just after a large diagonal branch. This was successfully stented with a 2.5 x 12 Synergy drug-eluting stent, postdilated to greater than 2.75 mm. 3. Mild disease in the left circumflex artery and its branches. 4. Focal area of restenosis in the mid right coronary artery prior stents. This was treated with cutting balloon angioplasty with a 3.5 x 10 angina balloon with no residual stenosis. 5. Normal left ventricular systolic function. LVEDP 7 mmHg. Ejection fraction 65%. RECOMMENDATION: Continue dual antiplatelet therapy for at least a year.   Echo 11/12/2013: -  Study data: Technically difficult study. - Left ventricle: The cavity size was normal. Wall thickness was normal. Systolic function was normal. The estimated ejection fraction was in the range of 60% to 65%. - Aortic valve: Valve area: 2.75cm^2(VTI). Valve area: 3.78cm^2 (Vmax).  Rica Mast, FNP-BC [facility] Short Stay Surgical Center/Anesthesiology Phone: [phone] 01/18/2015 1:05 PM

## 2015-01-14 ENCOUNTER — Telehealth: Payer: Self-pay | Admitting: Cardiology

## 2015-01-14 NOTE — Telephone Encounter (Signed)
Clayton Foster with Dr. Randa Evens office calling re status of surg clearance for dental procedure 01-22-15-needs to stop Brilinta 5 days prior and start back 2-3 days after-pls call

## 2015-01-14 NOTE — Telephone Encounter (Signed)
Will forward to Harriet Pho NP for directions

## 2015-01-15 NOTE — Telephone Encounter (Signed)
OK to stop Brilinta temporarily but restart immediately after procedure. Thanks

## 2015-01-15 NOTE — Telephone Encounter (Signed)
Faxed Ms.Lawrence's note along with form from [facility]  [phone] sent form to be scanned

## 2015-01-19 ENCOUNTER — Telehealth (INDEPENDENT_AMBULATORY_CARE_PROVIDER_SITE_OTHER): Payer: Self-pay | Admitting: *Deleted

## 2015-01-19 ENCOUNTER — Telehealth: Payer: Self-pay | Admitting: Adult Health

## 2015-01-19 MED ORDER — PEG 3350-KCL-NA BICARB-NACL 420 G PO SOLR: 4000.0000 mL | Freq: Once | ORAL | Status: DC

## 2015-01-19 NOTE — Telephone Encounter (Signed)
Dr Karilyn Cotaehman wants to know if it is ok to hold Brilinta 1 day and ASA for 2 days before procedure

## 2015-01-19 NOTE — Telephone Encounter (Signed)
Colonoscopy scheduled, [place] able to reach [place] [facility]

## 2015-01-19 NOTE — Telephone Encounter (Signed)
I already answered this question on 01/15/2015 as Yes, but must restart ASAP after procedure . See phone note for that day.

## 2015-01-19 NOTE — Telephone Encounter (Signed)
Patient needs trilyte 

## 2015-01-19 NOTE — Telephone Encounter (Signed)
Pt is having a procedure on Aug. 3rd and needs to stop Brilinta for 1 day and Asprin for 2 days --pls call [place] at [facility]

## 2015-01-19 NOTE — H&P (Signed)
HISTORY AND PHYSICAL  Clayton Foster is a 54 y.o. male patient with [id number] teeth  No diagnosis found.  Past Medical History  Diagnosis Date  . Anxiety   . Hypertension   . Diabetes mellitus   . Chronic back pain   . Chronic neck pain   . Coronary artery disease     a. 2000: s/p PCI/stenting of prox LAD and RCA @ [facility];  b. 09/2012 Cath/PCI: LM 30d, LAD 40p ISR, 85m, 25m/d (2.25x20 Promus Premier DES), LCX 30p, RCA 80p (3.0x24 Promus Premier DES), patent mid stent, 49m, PDA 25m (2.75x28 Promus Premier DES), EF 60-65%. c. cath 09/26/2014 EF 65%, patent LAD stents, DES 2.5 x 12 Synergy to mid LAD, 80% RCA stenosis treated with balloon angio  . Hyperlipidemia   . Tobacco abuse   . Myocardial infarction   . Dysrhythmia   . GERD (gastroesophageal reflux disease)   . Headache   . Neuromuscular disorder   . Arthritis     No current facility-administered medications for this encounter.   Current Outpatient Prescriptions  Medication Sig Dispense Refill  . acetaminophen (TYLENOL) 500 MG tablet Take 1,000 mg by mouth daily as needed for mild pain.     . ASPERCREME W/LIDOCAINE EX Apply 1 application topically daily as needed (pain).    Marland Kitchen aspirin EC 81 MG tablet Take 81 mg by mouth every morning.     Marland Kitchen atorvastatin (LIPITOR) 80 MG tablet Take 1 tablet (80 mg total) by mouth daily at 6 PM. 30 tablet 11  . cetirizine (ZYRTEC) 10 MG tablet Take 10 mg by mouth daily as needed for allergies.    Marland Kitchen gabapentin (NEURONTIN) 600 MG tablet Take 600 mg by mouth every 6 (six) hours.     Marland Kitchen lisinopril-hydrochlorothiazide (PRINZIDE,ZESTORETIC) 20-25 MG per tablet Take 1 tablet by mouth daily.    . metFORMIN (GLUCOPHAGE) 500 MG tablet Take 500 mg by mouth 2 (two) times daily with a meal.    . metoprolol tartrate (LOPRESSOR) 25 MG tablet Take 1 tablet (25 mg total) by mouth 2 (two) times daily. 60 tablet 11  . nitroGLYCERIN (NITROSTAT) 0.4 MG SL tablet Place 1 tablet (0.4 mg total) under the tongue every 5  (five) minutes as needed for chest pain. 25 tablet 1  . pantoprazole (PROTONIX) 40 MG tablet Take 40 mg by mouth daily as needed (acid reflux).     . ticagrelor (BRILINTA) 90 MG TABS tablet Take 1 tablet (90 mg total) by mouth 2 (two) times daily. 180 tablet 3  . tizanidine (ZANAFLEX) 2 MG capsule Take 2 mg by mouth 3 (three) times daily as needed for muscle spasms.    . polyethylene glycol-electrolytes (NULYTELY/GOLYTELY) 420 G solution Take 4,000 mLs by mouth once. 4000 mL 0   No Known Allergies Active Problems:   * No active hospital problems. *  Vitals: There were no vitals taken for this visit. Lab results:No results found for this or any previous visit (from the past 24 hour(s)). Radiology Results: No results found. General appearance: alert, cooperative and moderately obese Head: Normocephalic, without obvious abnormality, atraumatic Eyes: negative Nose: Nares normal. Septum midline. Mucosa normal. No drainage or sinus tenderness. Throat: multiple dental caries, periodontitis. Pharynx clear, no trismus Neck: no adenopathy, supple, symmetrical, trachea midline and thyroid not enlarged, symmetric, no tenderness/mass/nodules Resp: clear to auscultation bilaterally Cardio: regular rate and rhythm, S1, S2 normal, no murmur, click, rub or gallop  Assessment: Dental caries and periodontitis. All teeth nonrestorable   Plan:  Full dental extractions with alveoloplasty. GA. Day surgery.  [place] LopesJENSEN,Leighla Chestnutt M 01/19/2015

## 2015-01-20 NOTE — Telephone Encounter (Signed)
Spoke with Dewayne HatchAnn at Dr. Patty Sermonsehman's office. Instructions given. Ann voiced understanding.

## 2015-01-20 NOTE — Telephone Encounter (Signed)
Unable to reach Dr. Patty Sermonsehman's office by phone at this time.

## 2015-01-21 NOTE — Progress Notes (Addendum)
Had to leave VM instructing patient to be here at 5:30 AM on Friday, July 1st for surgery. (phone automatically rolled over to VM)

## 2015-01-22 ENCOUNTER — Ambulatory Visit (HOSPITAL_COMMUNITY): Payer: Medicaid Other | Admitting: Emergency Medicine

## 2015-01-22 ENCOUNTER — Encounter (HOSPITAL_COMMUNITY)
Admission: RE | Disposition: A | Discharge status: Home / Self Care | Payer: Self-pay | Source: Ambulatory Visit | Attending: Oral Surgery

## 2015-01-22 ENCOUNTER — Ambulatory Visit (HOSPITAL_COMMUNITY)
Admission: RE | Admit: 2015-01-22 | Discharge: 2015-01-22 | Disposition: A | Discharge status: Home / Self Care | Payer: Medicaid Other | Source: Ambulatory Visit | Attending: Oral Surgery | Admitting: Oral Surgery

## 2015-01-22 ENCOUNTER — Encounter (HOSPITAL_COMMUNITY): Payer: Self-pay | Admitting: *Deleted

## 2015-01-22 ENCOUNTER — Ambulatory Visit (HOSPITAL_COMMUNITY): Payer: Medicaid Other | Admitting: Certified Registered Nurse Anesthetist

## 2015-01-22 DIAGNOSIS — M542 Cervicalgia: Secondary | ICD-10-CM | POA: Insufficient documentation

## 2015-01-22 DIAGNOSIS — K219 Gastro-esophageal reflux disease without esophagitis: Secondary | ICD-10-CM | POA: Insufficient documentation

## 2015-01-22 DIAGNOSIS — E119 Type 2 diabetes mellitus without complications: Secondary | ICD-10-CM | POA: Insufficient documentation

## 2015-01-22 DIAGNOSIS — F17200 Nicotine dependence, unspecified, uncomplicated: Secondary | ICD-10-CM | POA: Insufficient documentation

## 2015-01-22 DIAGNOSIS — F419 Anxiety disorder, unspecified: Secondary | ICD-10-CM | POA: Insufficient documentation

## 2015-01-22 DIAGNOSIS — K029 Dental caries, unspecified: Secondary | ICD-10-CM | POA: Diagnosis not present

## 2015-01-22 DIAGNOSIS — Z951 Presence of aortocoronary bypass graft: Secondary | ICD-10-CM | POA: Insufficient documentation

## 2015-01-22 DIAGNOSIS — Z7982 Long term (current) use of aspirin: Secondary | ICD-10-CM | POA: Insufficient documentation

## 2015-01-22 DIAGNOSIS — E785 Hyperlipidemia, unspecified: Secondary | ICD-10-CM | POA: Insufficient documentation

## 2015-01-22 DIAGNOSIS — M1990 Unspecified osteoarthritis, unspecified site: Secondary | ICD-10-CM | POA: Insufficient documentation

## 2015-01-22 DIAGNOSIS — I252 Old myocardial infarction: Secondary | ICD-10-CM | POA: Insufficient documentation

## 2015-01-22 DIAGNOSIS — K0530 Chronic periodontitis, unspecified: Secondary | ICD-10-CM | POA: Insufficient documentation

## 2015-01-22 DIAGNOSIS — G709 Myoneural disorder, unspecified: Secondary | ICD-10-CM | POA: Insufficient documentation

## 2015-01-22 DIAGNOSIS — I10 Essential (primary) hypertension: Secondary | ICD-10-CM | POA: Insufficient documentation

## 2015-01-22 DIAGNOSIS — I2510 Atherosclerotic heart disease of native coronary artery without angina pectoris: Secondary | ICD-10-CM | POA: Insufficient documentation

## 2015-01-22 DIAGNOSIS — Z9861 Coronary angioplasty status: Secondary | ICD-10-CM | POA: Insufficient documentation

## 2015-01-22 DIAGNOSIS — Z79899 Other long term (current) drug therapy: Secondary | ICD-10-CM | POA: Insufficient documentation

## 2015-01-22 DIAGNOSIS — G8929 Other chronic pain: Secondary | ICD-10-CM | POA: Insufficient documentation

## 2015-01-22 HISTORY — DX: Headache: R51

## 2015-01-22 HISTORY — DX: Gastro-esophageal reflux disease without esophagitis: K21.9

## 2015-01-22 HISTORY — DX: Unspecified osteoarthritis, unspecified site: M19.90

## 2015-01-22 HISTORY — PX: MULTIPLE EXTRACTIONS WITH ALVEOLOPLASTY: SHX5342

## 2015-01-22 HISTORY — DX: Myoneural disorder, unspecified: G70.9

## 2015-01-22 HISTORY — DX: Acute myocardial infarction, unspecified: I21.9

## 2015-01-22 HISTORY — DX: Headache, unspecified: R51.9

## 2015-01-22 LAB — GLUCOSE, CAPILLARY
GLUCOSE-CAPILLARY: 105 mg/dL — AB (ref 65–99)
Glucose-Capillary: 151 mg/dL — ABNORMAL HIGH (ref 65–99)

## 2015-01-22 SURGERY — MULTIPLE EXTRACTION WITH ALVEOLOPLASTY
Anesthesia: General | Site: Mouth

## 2015-01-22 MED ORDER — LIDOCAINE-EPINEPHRINE 2 %-1:100000 IJ SOLN
INTRAMUSCULAR | Status: DC | PRN
  Administered 2015-01-22: 20 mL via INTRADERMAL

## 2015-01-22 MED ORDER — SUCCINYLCHOLINE CHLORIDE 20 MG/ML IJ SOLN
INTRAMUSCULAR | Status: DC | PRN
  Administered 2015-01-22: 100 mg via INTRAVENOUS

## 2015-01-22 MED ORDER — OXYCODONE-ACETAMINOPHEN 5-325 MG PO TABS: 1.0000 | ORAL_TABLET | ORAL | Status: DC | PRN

## 2015-01-22 MED ORDER — FENTANYL CITRATE (PF) 100 MCG/2ML IJ SOLN
25.0000 ug | INTRAMUSCULAR | Status: DC | PRN
  Administered 2015-01-22 (×4): 25 ug via INTRAVENOUS

## 2015-01-22 MED ORDER — FENTANYL CITRATE (PF) 100 MCG/2ML IJ SOLN
INTRAMUSCULAR | Status: AC
  Filled 2015-01-22: qty 2

## 2015-01-22 MED ORDER — PROPOFOL 10 MG/ML IV BOLUS
INTRAVENOUS | Status: AC
  Filled 2015-01-22: qty 20

## 2015-01-22 MED ORDER — FENTANYL CITRATE (PF) 250 MCG/5ML IJ SOLN
INTRAMUSCULAR | Status: AC
  Filled 2015-01-22: qty 5

## 2015-01-22 MED ORDER — MIDAZOLAM HCL 5 MG/5ML IJ SOLN
INTRAMUSCULAR | Status: DC | PRN
  Administered 2015-01-22: 2 mg via INTRAVENOUS

## 2015-01-22 MED ORDER — LACTATED RINGERS IV SOLN
INTRAVENOUS | Status: DC | PRN
  Administered 2015-01-22: 07:00:00 via INTRAVENOUS

## 2015-01-22 MED ORDER — MIDAZOLAM HCL 2 MG/2ML IJ SOLN
INTRAMUSCULAR | Status: AC
  Filled 2015-01-22: qty 2

## 2015-01-22 MED ORDER — DEXAMETHASONE SODIUM PHOSPHATE 4 MG/ML IJ SOLN
INTRAMUSCULAR | Status: DC | PRN
  Administered 2015-01-22: 4 mg via INTRAVENOUS

## 2015-01-22 MED ORDER — LIDOCAINE-EPINEPHRINE 2 %-1:100000 IJ SOLN
INTRAMUSCULAR | Status: AC
  Filled 2015-01-22: qty 1

## 2015-01-22 MED ORDER — LIDOCAINE HCL (CARDIAC) 20 MG/ML IV SOLN
INTRAVENOUS | Status: DC | PRN
  Administered 2015-01-22: 50 mg via INTRAVENOUS

## 2015-01-22 MED ORDER — SODIUM CHLORIDE 0.9 % IR SOLN
Status: DC | PRN
  Administered 2015-01-22: 1000 mL

## 2015-01-22 MED ORDER — OXYCODONE HCL 5 MG/5ML PO SOLN: 5.0000 mg | Freq: Once | ORAL | Status: DC | PRN

## 2015-01-22 MED ORDER — AMOXICILLIN 500 MG PO CAPS: 500.0000 mg | ORAL_CAPSULE | Freq: Three times a day (TID) | ORAL | Status: DC

## 2015-01-22 MED ORDER — PROPOFOL 10 MG/ML IV BOLUS
INTRAVENOUS | Status: DC | PRN
  Administered 2015-01-22: 200 mg via INTRAVENOUS

## 2015-01-22 MED ORDER — EPHEDRINE SULFATE 50 MG/ML IJ SOLN
INTRAMUSCULAR | Status: DC | PRN
  Administered 2015-01-22: 5 mg via INTRAVENOUS

## 2015-01-22 MED ORDER — OXYMETAZOLINE HCL 0.05 % NA SOLN
NASAL | Status: AC
  Filled 2015-01-22: qty 15

## 2015-01-22 MED ORDER — ONDANSETRON HCL 4 MG/2ML IJ SOLN: 4.0000 mg | Freq: Once | INTRAMUSCULAR | Status: DC | PRN

## 2015-01-22 MED ORDER — OXYCODONE HCL 5 MG PO TABS: 5.0000 mg | ORAL_TABLET | Freq: Once | ORAL | Status: DC | PRN

## 2015-01-22 MED ORDER — 0.9 % SODIUM CHLORIDE (POUR BTL) OPTIME
TOPICAL | Status: DC | PRN
  Administered 2015-01-22: 1000 mL

## 2015-01-22 MED ORDER — FENTANYL CITRATE (PF) 100 MCG/2ML IJ SOLN
INTRAMUSCULAR | Status: DC | PRN
  Administered 2015-01-22: 100 ug via INTRAVENOUS
  Administered 2015-01-22 (×3): 50 ug via INTRAVENOUS

## 2015-01-22 MED ORDER — OXYMETAZOLINE HCL 0.05 % NA SOLN
NASAL | Status: DC | PRN
  Administered 2015-01-22: 1

## 2015-01-22 MED ORDER — ONDANSETRON HCL 4 MG/2ML IJ SOLN
INTRAMUSCULAR | Status: DC | PRN
  Administered 2015-01-22: 4 mg via INTRAVENOUS

## 2015-01-22 SURGICAL SUPPLY — 29 items
BUR CROSS CUT FISSURE 1.6 (BURR) ×2 IMPLANT
BUR CROSS CUT FISSURE 1.6MM (BURR) ×1
BUR EGG ELITE 4.0 (BURR) IMPLANT
BUR EGG ELITE 4.0MM (BURR)
CANISTER SUCTION 2500CC (MISCELLANEOUS) ×3 IMPLANT
COVER SURGICAL LIGHT HANDLE (MISCELLANEOUS) ×3 IMPLANT
CRADLE DONUT ADULT HEAD (MISCELLANEOUS) ×3 IMPLANT
FLUID NSS /IRRIG 1000 ML XXX (MISCELLANEOUS) ×3 IMPLANT
GAUZE PACKING FOLDED 2  STR (GAUZE/BANDAGES/DRESSINGS) ×2
GAUZE PACKING FOLDED 2 STR (GAUZE/BANDAGES/DRESSINGS) ×1 IMPLANT
GLOVE BIO SURGEON STRL SZ 6.5 (GLOVE) ×2 IMPLANT
GLOVE BIO SURGEON STRL SZ7.5 (GLOVE) ×3 IMPLANT
GLOVE BIO SURGEONS STRL SZ 6.5 (GLOVE) ×1
GLOVE BIOGEL PI IND STRL 7.0 (GLOVE) ×1 IMPLANT
GLOVE BIOGEL PI INDICATOR 7.0 (GLOVE) ×2
GOWN STRL REUS W/ TWL LRG LVL3 (GOWN DISPOSABLE) ×1 IMPLANT
GOWN STRL REUS W/ TWL XL LVL3 (GOWN DISPOSABLE) ×1 IMPLANT
GOWN STRL REUS W/TWL LRG LVL3 (GOWN DISPOSABLE) ×2
GOWN STRL REUS W/TWL XL LVL3 (GOWN DISPOSABLE) ×2
KIT BASIN OR (CUSTOM PROCEDURE TRAY) ×3 IMPLANT
KIT ROOM TURNOVER OR (KITS) ×3 IMPLANT
NEEDLE 22X1 1/2 (OR ONLY) (NEEDLE) ×6 IMPLANT
NS IRRIG 1000ML POUR BTL (IV SOLUTION) ×3 IMPLANT
PAD ARMBOARD 7.5X6 YLW CONV (MISCELLANEOUS) ×3 IMPLANT
SUT CHROMIC 3 0 PS 2 (SUTURE) ×6 IMPLANT
SYR CONTROL 10ML LL (SYRINGE) ×3 IMPLANT
TRAY ENT MC OR (CUSTOM PROCEDURE TRAY) ×3 IMPLANT
TUBING IRRIGATION (MISCELLANEOUS) ×3 IMPLANT
YANKAUER SUCT BULB TIP NO VENT (SUCTIONS) ×3 IMPLANT

## 2015-01-22 NOTE — Op Note (Signed)
NAME:  Clayton Foster, Clayton Foster              ACCOUNT NO.:  [id number]  MEDICAL RECORD NO.:  [id number]  LOCATION:  [facility]                         FACILITY:  [facility]  PHYSICIAN:  Georgia LopesScott M. Maraki Macquarrie, M.D.  DATE OF BIRTH:  Jan 15, 1961  DATE OF PROCEDURE:  01/22/2015 DATE OF DISCHARGE:                              OPERATIVE REPORT   PREOPERATIVE DIAGNOSIS:  Nonrestorable teeth #2, #4, #5, #6, #7, #10, #11, #12, #13, #15, #20, #21, #22, #23, #24, #25, #26, #27, #28, #31 secondary to dental caries and periodontitis.  POSTOPERATIVE DIAGNOSIS:  Nonrestorable teeth #2, #4, #5, #6, #7, #10, #11, #12, #13, #15, #20, #21, #22, #23, #24, #25, #26, #27, #28, #31 secondary to dental caries and periodontitis.  PROCEDURE:  Extraction of teeth #2, #4, #5, #6, #7, #10, #11, #12, #13, #15, #20, #21, #22, #23, #24, #25, #26, #27, #28, #31, alveoplasty right and left maxilla.  SURGEON:  Georgia LopesScott M. Amarissa Koerner, M.D.  ANESTHESIA:  General, Dr. Noreene LarssonJoslin attending.  DESCRIPTION OF PROCEDURE:  The patient was taken to the operating room, placed on the table in a supine position.  General anesthesia was administered intravenously and an oral endotracheal tube was placed and marked.  The eyes were protected and the patient was draped for the procedure.  The posterior pharynx was suctioned and a throat pack was placed after time-out was performed.  Then, 2% lidocaine with 1:100,000 epinephrine was infiltrated in an inferior alveolar block on the right and left side and in buccal and palatal infiltration of the maxilla.  A total of 20 mL was utilized.  A bite-block was placed in the right side of the mouth and a Sweetheart retractor was used to retract the tongue. A #15 blade was used to make a full-thickness incision around teeth #20, #21, #22, #23, #24, #25, and #26 both buccally and lingually.  The periosteum was reflected and the teeth were elevated with a 301 elevator.  The teeth were then removed from the mouth with the  Ash forceps.  The sockets were curetted and the periosteum was reflected to expose the alveolar bone.  Alveoplasty was performed using the egg- shaped bur and bone file.  Then, the area was irrigated and closed with 3-0 chromic.  In the left maxilla, a 15-blade was used to make an incision around teeth #10, #11, #12, #13 and carried to tooth #15.  An incision was created on the buccal and palatal aspects of these teeth. The periosteum was reflected and the teeth were elevated with a 301 elevator.  The upper universal forceps was used to extract teeth.  Teeth #12 and #15 fractured during removal and additional bone was removed using a Stryker handpiece under irrigation until the roots of these teeth were removed.  Then, the sockets were curetted.  Alveoplasty was performed with the egg-shaped bur and bone file.  Then, the area was irrigated and closed with 3-0 chromic.  The Sweetheart retractor, endotracheal tube, and the bite block were all repositioned to the left side of the mouth and attention was turned to the right side.  A 15 blade was used to make an incision around teeth #27, #28, and #31 in the mandible and around  teeth #2, #4, #5, and #6 in the maxilla.  The periosteum was reflected.  The teeth were elevated with the dental elevator and removed from the mouth with a dental forceps.  Tooth #2 fractured during removal and additional bone removal was required to remove the roots of this tooth.  Then, the sockets were curetted and then the periosteum was reflected to further expose the alveolar crest. Alveoplasty was performed in the right maxilla and mandible using the egg-shaped bur and bone file.  Then the areas were irrigated and closed with 3-0 chromic.  The oral cavity was then inspected and found to have good contour, hemostasis, and closure.  The oral cavity was irrigated and suctioned.  The throat pack was removed.  The patient was awakened, taken to the recovery room,  breathing spontaneously in good condition.  EBL:  Minimum.  COMPLICATIONS:  None.  SPECIMENS:  None.     Georgia Lopes, M.D.     SMJ/MEDQ  D:  01/22/2015  T:  01/22/2015  Job:  [id number]

## 2015-01-22 NOTE — Anesthesia Preprocedure Evaluation (Addendum)
Anesthesia Evaluation  Patient identified by MRN, date of birth, ID band Patient awake    Airway Mallampati: III  TM Distance: >3 FB Neck ROM: Full    Dental  (+) Poor Dentition   Pulmonary Current Smoker,  breath sounds clear to auscultation        Cardiovascular hypertension, + CAD, + Past MI and + Cardiac Stents Rhythm:Regular     Neuro/Psych    GI/Hepatic GERD-  ,  Endo/Other  diabetes, Well Controlled  Renal/GU      Musculoskeletal  (+) Arthritis -,   Abdominal   Peds  Hematology   Anesthesia Other Findings   Reproductive/Obstetrics                            Anesthesia Physical Anesthesia Plan  ASA: III  Anesthesia Plan: General   Post-op Pain Management:    Induction: Intravenous  Airway Management Planned: Oral ETT  Additional Equipment:   Intra-op Plan:   Post-operative Plan:   Informed Consent: I have reviewed the patients History and Physical, chart, labs and discussed the procedure including the risks, benefits and alternatives for the proposed anesthesia with the patient or authorized representative who has indicated his/her understanding and acceptance.   Dental advisory given  Plan Discussed with: CRNA and Anesthesiologist  Anesthesia Plan Comments:         Anesthesia Quick Evaluation

## 2015-01-22 NOTE — Anesthesia Procedure Notes (Signed)
Procedure Name: Intubation Date/Time: 01/22/2015 7:33 AM Performed by: Adonis HousekeeperNGELL, Jeaneane Adamec M Pre-anesthesia Checklist: Patient identified, Emergency Drugs available, Suction available and Patient being monitored Patient Re-evaluated:Patient Re-evaluated prior to inductionOxygen Delivery Method: Circle system utilized Preoxygenation: Pre-oxygenation with 100% oxygen Intubation Type: IV induction Ventilation: Mask ventilation without difficulty and Oral airway inserted - appropriate to patient size Laryngoscope Size: Mac and 4 Grade View: Grade IV Tube type: Oral Tube size: 7.0 mm Number of attempts: 1 Airway Equipment and Method: Stylet Placement Confirmation: ETT inserted through vocal cords under direct vision,  positive ETCO2 and breath sounds checked- equal and bilateral Secured at: 22 cm Tube secured with: Tape Dental Injury: Teeth and Oropharynx as per pre-operative assessment  Difficulty Due To: Difficult Airway- due to anterior larynx and Difficult Airway- due to immobile epiglottis

## 2015-01-22 NOTE — H&P (Signed)
H&P documentation  -History and Physical Reviewed  -Patient has been re-examined  -No change in the plan of care  Khilynn Borntreger M  

## 2015-01-22 NOTE — Op Note (Signed)
01/22/2015  8:16 AM  PATIENT:  Adelfa Kohichard W Meisenheimer  54 y.o. male  PRE-OPERATIVE DIAGNOSIS:  NON RESTORABLE TEETH  # 2, 4, 5, 6, 7, 10, 11, 12, 13, 15, 20, 21, 22, 23, 24, 25, 26, 27, 28, 31,  POST-OPERATIVE DIAGNOSIS:  SAME  PROCEDURE:  Procedure(s): MULTIPLE EXTRACTIONS # 2, 4, 5, 6, 7, 10, 11, 12, 13, 15, 20, 21, 22, 23, 24, 25, 26, 27, 28, 31, and Alveloplasty  SURGEON:  Surgeon(s): Ocie DoyneScott Valjean Ruppel, DDS  ANESTHESIA:   local and general  EBL:  minimal  DRAINS: none   SPECIMEN:  No Specimen  COUNTS:  YES  PLAN OF CARE: Discharge to home after PACU  PATIENT DISPOSITION:  PACU - hemodynamically stable.   PROCEDURE DETAILS: Dictation # [id number]  Georgia LopesScott M. Eltha Tingley, DMD 01/22/2015 8:16 AM

## 2015-01-22 NOTE — Anesthesia Postprocedure Evaluation (Signed)
  Anesthesia Post-op Note  Patient: Clayton Foster  Procedure(s) Performed: Procedure(s): MULTIPLE EXTRACTIONS # 2, 4, 5, 6, 7, 10, 11, 12, 13, 15, 20, 21, 22, 23, 24, 25, 26, 27, 28, 31, and Alveloplasty (N/A)  Patient Location: PACU  Anesthesia Type:General  Level of Consciousness: awake, alert  and oriented  Airway and Oxygen Therapy: Patient Spontanous Breathing  Post-op Pain: none  Post-op Assessment: Post-op Vital signs reviewed, Patient's Cardiovascular Status Stable, Respiratory Function Stable, Patent Airway and Pain level controlled              Post-op Vital Signs: stable  Last Vitals:  Filed Vitals:   01/22/15 1005  BP:   Pulse:   Temp: 36.4 C  Resp:     Complications: No apparent anesthesia complications

## 2015-01-22 NOTE — Transfer of Care (Signed)
Immediate Anesthesia Transfer of Care Note  Patient: Adelfa KohRichard W Haning  Procedure(s) Performed: Procedure(s): MULTIPLE EXTRACTIONS # 2, 4, 5, 6, 7, 10, 11, 12, 13, 15, 20, 21, 22, 23, 24, 25, 26, 27, 28, 31, and Alveloplasty (N/A)  Patient Location: PACU  Anesthesia Type:General  Level of Consciousness: awake, alert , oriented and patient cooperative  Airway & Oxygen Therapy: Patient Spontanous Breathing and Patient connected to face mask oxygen  Post-op Assessment: Report given to RN, Post -op Vital signs reviewed and stable, Patient moving all extremities and Patient moving all extremities X 4  Post vital signs: Reviewed and stable  Last Vitals:  Filed Vitals:   01/22/15 0610  BP: 115/64  Pulse: 60  Temp: 36.5 C  Resp: 20    Complications: No apparent anesthesia complications

## 2015-01-26 ENCOUNTER — Encounter (HOSPITAL_COMMUNITY): Payer: Self-pay | Admitting: Oral Surgery

## 2015-02-08 ENCOUNTER — Telehealth (INDEPENDENT_AMBULATORY_CARE_PROVIDER_SITE_OTHER): Payer: Self-pay | Admitting: *Deleted

## 2015-02-08 NOTE — Telephone Encounter (Signed)
Referring MD/PCP: Augusto Gambleanthony roberts, pa -- [facility]   Procedure: tcs  Reason/Indication:  Screening, fam hx colon ca  Has patient had this procedure before?  no  If so, when, by whom and where?    Is there a family history of colon cancer?  Yes, father  Who?  What age when diagnosed?    Is patient diabetic?   yes      Does patient have prosthetic heart valve?  no  Do you have a pacemaker?  no  Has patient ever had endocarditis? no  Has patient had joint replacement within last 12 months?  no  Does patient tend to be constipated or take laxatives? no  Is patient on Coumadin, Plavix and/or Aspirin? yes  Medications: brilinta 90 mg bid, asa 81 mg daily, metformin 1000 mg bid, lisinopril/hctz 20/25 mg daily, glipizide 10 mg daily, metoprolol 25 mg bid, atorvastatin 80 mg daily, cetirizine 10 mg daily  Allergies: nkda  Medication Adjustment: brilinta and asa 2 days, hold DM med evening before & morning of  Procedure date & time: 02/24/15 at 1030

## 2015-02-10 NOTE — Telephone Encounter (Signed)
agree

## 2015-02-24 ENCOUNTER — Telehealth (INDEPENDENT_AMBULATORY_CARE_PROVIDER_SITE_OTHER): Payer: Self-pay | Admitting: *Deleted

## 2015-02-24 ENCOUNTER — Ambulatory Visit (HOSPITAL_COMMUNITY): Admission: RE | Admit: 2015-02-24 | Payer: Medicaid Other | Source: Ambulatory Visit | Admitting: Internal Medicine

## 2015-02-24 ENCOUNTER — Encounter (HOSPITAL_COMMUNITY): Admission: RE | Payer: Self-pay | Source: Ambulatory Visit

## 2015-02-24 SURGERY — COLONOSCOPY
Anesthesia: Moderate Sedation

## 2015-02-24 NOTE — Telephone Encounter (Signed)
Patient called ENDO this morning to cancel his procedure for today, his mother is in the hospital -- he will call our office back to reschedule when he is ready

## 2015-05-31 ENCOUNTER — Telehealth: Payer: Self-pay | Admitting: Adult Health

## 2015-05-31 NOTE — Telephone Encounter (Signed)
[facility] and gave her Ms Lawrence's advice

## 2015-05-31 NOTE — Telephone Encounter (Signed)
Needs ok to stop Brilinta 3 days prior to L5 S1 transperamatal injection. Has not been scheduled yet. Clayton Foster/Please call back at above number. / tg

## 2015-05-31 NOTE — Telephone Encounter (Signed)
Will forward to Ms Lyman BishopLawrence NP for advice

## 2015-05-31 NOTE — Telephone Encounter (Signed)
He will need to wait a minimum of one year before stopping antiplatelet therapy for non-emergent -elective procedures. It can be scheduled for March of 2017 as his stent was placed in March of 2016.

## 2015-06-10 ENCOUNTER — Other Ambulatory Visit (HOSPITAL_COMMUNITY): Payer: Self-pay | Admitting: Physical Medicine and Rehabilitation

## 2015-06-10 DIAGNOSIS — M5136 Other intervertebral disc degeneration, lumbar region: Secondary | ICD-10-CM

## 2015-06-10 DIAGNOSIS — M5416 Radiculopathy, lumbar region: Secondary | ICD-10-CM

## 2015-06-24 ENCOUNTER — Encounter: Payer: Self-pay | Admitting: Adult Health

## 2015-06-24 ENCOUNTER — Ambulatory Visit (HOSPITAL_COMMUNITY): Payer: Medicaid Other

## 2015-06-24 ENCOUNTER — Ambulatory Visit (INDEPENDENT_AMBULATORY_CARE_PROVIDER_SITE_OTHER): Payer: Medicaid Other | Admitting: Adult Health

## 2015-06-24 VITALS — BP 148/86 | HR 83 | Ht 75.0 in | Wt 261.0 lb

## 2015-06-24 DIAGNOSIS — E7800 Pure hypercholesterolemia, unspecified: Secondary | ICD-10-CM | POA: Diagnosis not present

## 2015-06-24 DIAGNOSIS — I2510 Atherosclerotic heart disease of native coronary artery without angina pectoris: Secondary | ICD-10-CM | POA: Diagnosis not present

## 2015-06-24 DIAGNOSIS — I10 Essential (primary) hypertension: Secondary | ICD-10-CM

## 2015-06-24 NOTE — Progress Notes (Addendum)
Cardiology Office Note   Date:  06/24/2015   ID:  Clayton Foster, DOB 16-Jan-1961, MRN [id number]  PCP:  Quinn Axe, PA-C  Cardiologist: [place]/ Joni Reining, NP   No chief complaint on file.     History of Present Illness: Clayton Foster is a 54 y.o. male who presents for ongoing assessment and management of CAD, with recent cardiac cath in 09/2014 demonstrating patent stents, but 80% mid LAD stenosis, treated with balloon angioplasty. Hypertension, diabetes, chronic back pain, and anxiety. He was sent home on aspirin and Tricagrelor.   He comes today with continued complaints of anxiety. He has no chest pain, DOE or fatigue. He is medically compliant. He is being seen by [facility] and has had labs drawn this week, which he states were all normal. He is being referred to psychiatry by his PCP.  Past Medical History  Diagnosis Date  . Anxiety   . Hypertension   . Diabetes mellitus   . Chronic back pain   . Chronic neck pain   . Coronary artery disease     a. 2000: s/p PCI/stenting of prox LAD and RCA @ [facility];  b. 09/2012 Cath/PCI: LM 30d, LAD 40p ISR, 29m, 48m/d (2.25x20 Promus Premier DES), LCX 30p, RCA 80p (3.0x24 Promus Premier DES), patent mid stent, 18m, PDA 60m (2.75x28 Promus Premier DES), EF 60-65%. c. cath 09/26/2014 EF 65%, patent LAD stents, DES 2.5 x 12 Synergy to mid LAD, 80% RCA stenosis treated with balloon angio  . Hyperlipidemia   . Tobacco abuse   . Myocardial infarction (HCC)   . Dysrhythmia   . GERD (gastroesophageal reflux disease)   . Headache   . Neuromuscular disorder (HCC)   . Arthritis     Past Surgical History  Procedure Laterality Date  . Back surgery    . Appendectomy    . Tonsillectomy    . Cardiac stents    . Left heart catheterization with coronary angiogram N/A 10/11/2012    Procedure: LEFT HEART CATHETERIZATION WITH CORONARY ANGIOGRAM;  Surgeon: Laurey Morale, MD;  Location[facility] CATH LAB;  Service:  Cardiovascular;  Laterality: N/A;  . Percutaneous coronary stent intervention (pci-s) N/A 10/14/2012    Procedure: PERCUTANEOUS CORONARY STENT INTERVENTION (PCI-S);  Surgeon: Tonny Bollman, MD;  Location: [facility] CATH LAB;  Service: Cardiovascular;  Laterality: N/A;  . Left heart catheterization with coronary angiogram N/A 09/26/2014    Procedure: LEFT HEART CATHETERIZATION WITH CORONARY ANGIOGRAM;  Surgeon: Corky Crafts, MD;  Location: [facility] CATH LAB;  Service: Cardiovascular;  Laterality: N/A;  . Percutaneous coronary stent intervention (pci-s)  09/26/2014    Procedure: PERCUTANEOUS CORONARY STENT INTERVENTION (PCI-S);  Surgeon: Corky Crafts, MD;  Location: [facility] CATH LAB;  Service: Cardiovascular;;  . Cardiac catheterization    . Coronary angioplasty    . Stents placed  09-2014    had heart attack  . Multiple extractions with alveoloplasty N/A 01/22/2015    Procedure: MULTIPLE EXTRACTIONS # 2, 4, 5, 6, 7, 10, 11, 12, 13, 15, 20, 21, 22, 23, 24, 25, 26, 27, 28, 31, and Alveloplasty;  Surgeon: Ocie Doyne, DDS;  Location: [facility] OR;  Service: Oral Surgery;  Laterality: N/A;     Current Outpatient Prescriptions  Medication Sig Dispense Refill  . ASPERCREME W/LIDOCAINE EX Apply 1 application topically daily as needed (pain).    Marland Kitchen aspirin EC 81 MG tablet Take 81 mg by mouth every morning.     Marland Kitchen atorvastatin (LIPITOR) 80 MG  tablet Take 1 tablet (80 mg total) by mouth daily at 6 PM. 30 tablet 11  . cetirizine (ZYRTEC) 10 MG tablet Take 10 mg by mouth daily as needed for allergies.    Marland Kitchen gabapentin (NEURONTIN) 600 MG tablet Take 600 mg by mouth every 6 (six) hours.     Marland Kitchen lisinopril-hydrochlorothiazide (PRINZIDE,ZESTORETIC) 20-25 MG per tablet Take 1 tablet by mouth daily.    . metFORMIN (GLUCOPHAGE) 500 MG tablet Take 500 mg by mouth 2 (two) times daily with a meal.    . metoprolol tartrate (LOPRESSOR) 25 MG tablet Take 1 tablet (25 mg total) by mouth 2 (two) times daily. 60 tablet 11  . nitroGLYCERIN  (NITROSTAT) 0.4 MG SL tablet Place 1 tablet (0.4 mg total) under the tongue every 5 (five) minutes as needed for chest pain. 25 tablet 1  . pantoprazole (PROTONIX) 40 MG tablet Take 40 mg by mouth daily as needed (acid reflux).     . polyethylene glycol-electrolytes (NULYTELY/GOLYTELY) 420 G solution Take 4,000 mLs by mouth once. 4000 mL 0  . ranitidine (ZANTAC) 150 MG tablet Take 150 mg by mouth 2 (two) times daily.    . ticagrelor (BRILINTA) 90 MG TABS tablet Take 1 tablet (90 mg total) by mouth 2 (two) times daily. 180 tablet 3  . tizanidine (ZANAFLEX) 2 MG capsule Take 2 mg by mouth 3 (three) times daily as needed for muscle spasms.    . traMADol (ULTRAM) 50 MG tablet Take 1 tab by mouth q6 hrs prn     No current facility-administered medications for this visit.    Allergies:   Review of patient's allergies indicates no known allergies.    Social History:  The patient  reports that he has been smoking Cigarettes.  He started smoking about 37 years ago. He has been smoking about 0.50 packs per day. He has quit using smokeless tobacco. He reports that he does not drink alcohol or use illicit drugs.   Family History:  The patient's family history includes Hypertension in his father.    ROS: All other systems are reviewed and negative. Unless otherwise mentioned in H&P    PHYSICAL EXAM: VS:  BP 148/86 mmHg  Pulse 83  Ht  (1.905 m)  Wt 261 lb (118.389 kg)  BMI 32.62 kg/m2  SpO2 98% , BMI Body mass index is 32.62 kg/(m^2). GEN: Well nourished, well developed, in no acute distress HEENT: normal Neck: no JVD, carotid bruits, or masses Cardiac: RRR; no murmurs, rubs, or gallops,no edema  Respiratory:  clear to auscultation bilaterally, normal work of breathing GI: soft, nontender, nondistended, + BS MS: no deformity or atrophyCath insertion site is well healed. No hematoma or bleeding seen.  Skin: warm and dry, no rash Neuro:  Strength and sensation are intact Psych: euthymic  mood, full affect   Recent Labs: 09/26/2014: ALT 40 01/11/2015: BUN 6; Creatinine, Ser 0.83; Hemoglobin 15.7; Platelets 274; Potassium 4.4; Sodium 129*    Lipid Panel    Component Value Date/Time   CHOL 187 10/10/2012 1036   TRIG 173* 10/10/2012 1036   HDL 27* 10/10/2012 1036   CHOLHDL 6.9 10/10/2012 1036   VLDL 35 10/10/2012 1036   LDLCALC 125* 10/10/2012 1036      Wt Readings from Last 3 Encounters:  06/24/15 261 lb (118.389 kg)  01/22/15 247 lb (112.038 kg)  01/11/15 247 lb 14.4 oz (112.447 kg)      ASSESSMENT AND PLAN:  1.  CAD: No complaints. Stable from cardiology  standpoint. He will continue on DAPT, statin and metoprolol and ACE. He will be seen in a year unless symptomatic.   2. Hypertension: Currently well controlled. No changes in medications.   3. Anxiety: He is referred to psychiatry by his PCP. He is encouraged to keep that appointment.   Current medicines are reviewed at length with the patient today.    Labs/ tests ordered today include: No orders of the defined types were placed in this encounter.     Disposition:   FU with 3 months.   Signed, Joni ReiningKathryn Leaha Cuervo, NP  06/24/2015 2:23 PM     [facility] [address], [place], [place] [address] Phone: [phone]; Fax: [phone]

## 2015-06-24 NOTE — Progress Notes (Deleted)
Name: Clayton Foster    DOB: 1960/09/23  Age: 54 y.o.  MR#: [id number]       PCP:  Quinn Axe, PA-C      Insurance: Payor: MEDICAID [place] / Plan: MEDICAID [place] ACCESS / Product Type: *No Product type* /   CC:   No chief complaint on file.   VS Filed Vitals:   06/24/15 1413  BP: 148/86  Pulse: 83  Height:  (1.905 m)  Weight: 261 lb (118.389 kg)  SpO2: 98%    Weights Current Weight  06/24/15 261 lb (118.389 kg)  01/22/15 247 lb (112.038 kg)  01/11/15 247 lb 14.4 oz (112.447 kg)    Blood Pressure  BP Readings from Last 3 Encounters:  06/24/15 148/86  01/22/15 148/84  01/11/15 129/72     Admit date:  (Not on file) Last encounter with RMR:  05/31/2015   Allergy Review of patient's allergies indicates no known allergies.  Current Outpatient Prescriptions  Medication Sig Dispense Refill  . ASPERCREME W/LIDOCAINE EX Apply 1 application topically daily as needed (pain).    Marland Kitchen aspirin EC 81 MG tablet Take 81 mg by mouth every morning.     Marland Kitchen atorvastatin (LIPITOR) 80 MG tablet Take 1 tablet (80 mg total) by mouth daily at 6 PM. 30 tablet 11  . cetirizine (ZYRTEC) 10 MG tablet Take 10 mg by mouth daily as needed for allergies.    Marland Kitchen gabapentin (NEURONTIN) 600 MG tablet Take 600 mg by mouth every 6 (six) hours.     Marland Kitchen lisinopril-hydrochlorothiazide (PRINZIDE,ZESTORETIC) 20-25 MG per tablet Take 1 tablet by mouth daily.    . metFORMIN (GLUCOPHAGE) 500 MG tablet Take 500 mg by mouth 2 (two) times daily with a meal.    . metoprolol tartrate (LOPRESSOR) 25 MG tablet Take 1 tablet (25 mg total) by mouth 2 (two) times daily. 60 tablet 11  . nitroGLYCERIN (NITROSTAT) 0.4 MG SL tablet Place 1 tablet (0.4 mg total) under the tongue every 5 (five) minutes as needed for chest pain. 25 tablet 1  . pantoprazole (PROTONIX) 40 MG tablet Take 40 mg by mouth daily as needed (acid reflux).     . polyethylene glycol-electrolytes (NULYTELY/GOLYTELY) 420 G solution Take 4,000 mLs by mouth  once. 4000 mL 0  . ranitidine (ZANTAC) 150 MG tablet Take 150 mg by mouth 2 (two) times daily.    . ticagrelor (BRILINTA) 90 MG TABS tablet Take 1 tablet (90 mg total) by mouth 2 (two) times daily. 180 tablet 3  . tizanidine (ZANAFLEX) 2 MG capsule Take 2 mg by mouth 3 (three) times daily as needed for muscle spasms.    . traMADol (ULTRAM) 50 MG tablet Take 1 tab by mouth q6 hrs prn     No current facility-administered medications for this visit.    Discontinued Meds:    Medications Discontinued During This Encounter  Medication Reason  . amoxicillin (AMOXIL) 500 MG capsule Error  . oxyCODONE-acetaminophen (PERCOCET) 5-325 MG per tablet Error    Patient Active Problem List   Diagnosis Date Noted  . NSTEMI (non-ST elevated myocardial infarction) (HCC) 09/26/2014  . Hyponatremia 10/10/2012  . Chest pain 07/22/2012  . CAD (coronary artery disease) 07/22/2012  . Panic disorder 07/22/2012  . Tobacco use 07/22/2012  . GERD (gastroesophageal reflux disease) 07/22/2012  . Diabetes mellitus, type 2 (HCC) 09/28/2011  . Hypertension 09/28/2011  . High cholesterol 09/28/2011  . Back pain with radiculopathy 09/28/2011    LABS    Component  Value Date/Time   NA 129* 01/11/2015 1047   NA 135 09/28/2014 1938   NA 134* 09/27/2014 0025   K 4.4 01/11/2015 1047   K 3.8 09/28/2014 1938   K 3.4* 09/27/2014 0025   CL 94* 01/11/2015 1047   CL 100 09/28/2014 1938   CL 101 09/27/2014 0025   CO2 25 01/11/2015 1047   CO2 23 09/28/2014 1938   CO2 22 09/27/2014 0025   GLUCOSE 127* 01/11/2015 1047   GLUCOSE 148* 09/28/2014 1938   GLUCOSE 166* 09/27/2014 0025   BUN 6 01/11/2015 1047   BUN <5* 09/28/2014 1938   BUN 7 09/27/2014 0025   CREATININE 0.83 01/11/2015 1047   CREATININE 0.75 09/28/2014 1938   CREATININE 0.74 09/27/2014 0025   CALCIUM 9.9 01/11/2015 1047   CALCIUM 9.7 09/28/2014 1938   CALCIUM 9.0 09/27/2014 0025   GFRNONAA >60 01/11/2015 1047   GFRNONAA >90 09/28/2014 1938    GFRNONAA >90 09/27/2014 0025   GFRAA >60 01/11/2015 1047   GFRAA >90 09/28/2014 1938   GFRAA >90 09/27/2014 0025   CMP     Component Value Date/Time   NA 129* 01/11/2015 1047   K 4.4 01/11/2015 1047   CL 94* 01/11/2015 1047   CO2 25 01/11/2015 1047   GLUCOSE 127* 01/11/2015 1047   BUN 6 01/11/2015 1047   CREATININE 0.83 01/11/2015 1047   CALCIUM 9.9 01/11/2015 1047   PROT 7.8 09/26/2014 1145   ALBUMIN 4.4 09/26/2014 1145   AST 35 09/26/2014 1145   ALT 40 09/26/2014 1145   ALKPHOS 80 09/26/2014 1145   BILITOT 1.1 09/26/2014 1145   GFRNONAA >60 01/11/2015 1047   GFRAA >60 01/11/2015 1047       Component Value Date/Time   WBC 8.2 01/11/2015 1047   WBC 9.7 09/28/2014 1938   WBC 9.9 09/27/2014 0025   HGB 15.7 01/11/2015 1047   HGB 17.2* 09/28/2014 1938   HGB 17.0 09/27/2014 0025   HCT 41.9 01/11/2015 1047   HCT 46.7 09/28/2014 1938   HCT 46.3 09/27/2014 0025   MCV 89.9 01/11/2015 1047   MCV 91.9 09/28/2014 1938   MCV 92.8 09/27/2014 0025    Lipid Panel     Component Value Date/Time   CHOL 187 10/10/2012 1036   TRIG 173* 10/10/2012 1036   HDL 27* 10/10/2012 1036   CHOLHDL 6.9 10/10/2012 1036   VLDL 35 10/10/2012 1036   LDLCALC 125* 10/10/2012 1036    ABG No results found for: PHART, PCO2ART, PO2ART, HCO3, TCO2, ACIDBASEDEF, O2SAT   No results found for: TSH BNP (last 3 results) No results for input(s): BNP in the last 8760 hours.  ProBNP (last 3 results) No results for input(s): PROBNP in the last 8760 hours.  Cardiac Panel (last 3 results) No results for input(s): CKTOTAL, CKMB, TROPONINI, RELINDX in the last 72 hours.  Iron/TIBC/Ferritin/ %Sat No results found for: IRON, TIBC, FERRITIN, IRONPCTSAT   EKG Orders placed or performed during the hospital encounter of 09/28/14  . EKG 12-Lead  . EKG 12-Lead  . EKG 12-Lead  . EKG 12-Lead  . EKG 12-Lead  . EKG 12-Lead  . EKG  . EKG     Prior Assessment and Plan Problem List as of 06/24/2015       Cardiovascular and Mediastinum   Hypertension   Last Assessment & Plan 01/29/2014 Office Visit Written 01/29/2014  2:45 PM by Jodelle Gross, NP    Excellent control of blood pressure at this time. No changes in  his medication regimen. We will see him again in 4 months postoperatively unless he is to see us sooner.      CAD (coronary artery disease)   Last Assessment & Plan 01/29/2014 Office Visit Written 01/29/2014  2:44 PM by Jodelle GrossKathryn M Lawrence, NP    The patient had a repeat cardiac stress test completed in April 2015 is found to be negative for ischemia. The patient had normal LV systolic function, and was found a low risk study for any major cardiac events. EKG is completed during this office visit revealing normal sinus rhythm without evidence of ischemia AV blocks or arrhythmias. PR interval 0.136 milliseconds QT 404, with a QTC of 420 ms. Blood pressure is excellently controlled.  The patient is clear from a cardiac standpoint to proceed with back surgery. Would continue beta blockers metoprolol 25 mg twice a day, and aspirin perioperatively along with quinapril HCTZ for blood pressure control. A copy of the stress test and EKG will be sent with a letter to Dr. Laurine BlazerGuthrie, with a copy to the patient, for documentation that the patient can proceed with surgery.      NSTEMI (non-ST elevated myocardial infarction) [facility](HCC)     Digestive   GERD (gastroesophageal reflux disease)     Endocrine   Diabetes mellitus, type 2 (HCC)     Other   High cholesterol   Last Assessment & Plan 01/29/2014 Office Visit Written 01/29/2014  2:45 PM by Jodelle GrossKathryn M Lawrence, NP    Continue statin therapy with atorvastatin 40 mg daily. Remain on low cholesterol diet.      Back pain with radiculopathy   Last Assessment & Plan 02/21/2013 Office Visit Written 02/21/2013  1:28 PM by Jodelle GrossKathryn M Lawrence, NP    Surgery is planned for Sept or Oct. He will make appt when surgery date is set for pre-operative evaluation.      Chest  pain   Panic disorder   Last Assessment & Plan 02/21/2013 Office Visit Written 02/21/2013  1:26 PM by Jodelle GrossKathryn M Lawrence, NP    He talks today about having more panic attacks more often the last few days. He will be seeing psychiatrist this month.      Tobacco use   Last Assessment & Plan 10/22/2012 Office Visit Written 10/22/2012  2:20 PM by Jodelle GrossKathryn M Lawrence, NP    This has been discussed, he is cutting down on his cigarette use. He has a history of anxiety and uses this to help calm himself down. I explained the risks associated with continued smoking and recurrence of her artery stenosis. He verbalizes understanding.      Hyponatremia       Imaging: No results found.

## 2015-06-24 NOTE — Patient Instructions (Signed)
Your physician wants you to follow-up in: 1 year with K Lawrence NP You will receive a reminder letter in the mail two months in advance. If you don't receive a letter, please call our office to schedule the follow-up appointment.    Your physician recommends that you continue on your current medications as directed. Please refer to the Current Medication list given to you today.     If you need a refill on your cardiac medications before your next appointment, please call your pharmacy.    Thank you for choosing [facility] !        

## 2015-10-17 ENCOUNTER — Other Ambulatory Visit: Payer: Self-pay | Admitting: Physician Assistant

## 2015-10-18 NOTE — Telephone Encounter (Signed)
Please review for refill, Thank you. 

## 2015-11-09 ENCOUNTER — Telehealth: Payer: Self-pay | Admitting: Adult Health

## 2015-11-09 NOTE — Telephone Encounter (Signed)
Yes, he can stop Brilinta for 7 days as he has been taking it since March of 2016. Will need to restart this medication after injection so that he can be seen in our office afterwards on previously scheduled appointment.

## 2015-11-09 NOTE — Telephone Encounter (Signed)
Willa from [facility] called and is wondering when this pt will need to hold his BRILINTA, he's needing to have a procedure done

## 2015-11-09 NOTE — Telephone Encounter (Signed)
Spoke with Nurse Ancil BoozerWilla from [facility]. They would like to know if it is ok for Pt  to have steroid injection, he will need to hold Brilinta for 7 days. Will for forward to Joni ReiningKathryn Lawrence, NP.

## 2015-11-09 NOTE — Telephone Encounter (Signed)
VM left left for Clayton Foster at [phone].

## 2016-04-07 ENCOUNTER — Encounter (INDEPENDENT_AMBULATORY_CARE_PROVIDER_SITE_OTHER): Payer: Self-pay | Admitting: *Deleted

## 2016-04-21 ENCOUNTER — Telehealth: Payer: Self-pay | Admitting: Adult Health

## 2016-04-21 NOTE — Telephone Encounter (Signed)
Pt is scheduled to have an epidural and needs to stop his Brilinta and Asprin--it's not scheduled yet, but he would like to know everything before hand. Please give [name] a call [phone]

## 2016-05-09 ENCOUNTER — Telehealth: Payer: Self-pay | Admitting: Adult Health

## 2016-05-09 NOTE — Telephone Encounter (Signed)
Will forward to K Lawrence NP for dispo. 

## 2016-05-09 NOTE — Telephone Encounter (Signed)
Patient wants to know if he can come off of "blood thinner". / tg

## 2016-05-11 NOTE — Telephone Encounter (Signed)
He will need to be seen during appointment time to discuss this.

## 2016-05-12 NOTE — Telephone Encounter (Signed)
Pt agrees to ov, have messaged front desk to schedule apt for him

## 2016-05-23 ENCOUNTER — Ambulatory Visit: Payer: Medicaid Other | Admitting: Adult Health

## 2016-05-30 NOTE — Progress Notes (Signed)
Cardiology Office Note    Date:  05/31/2016   ID:  [name], DOB 09-Oct-1960, MRN [id number]  PCP:  Quinn Axe, PA-C  Cardiologist: Was Dr. Shirlee Latch, to be established here  Chief Complaint  Patient presents with  . Pre-op Exam    History of Present Illness:  Clayton Foster is a 55 y.o. male with history of CAD status post NSTEMI treated with DES to the mid LAD and PTCA to the RCA 09/2014.Also has hypertension, diabetes mellitus, chronic back pain and anxiety.  Patient comes in today for preoperative clearance before undergoing back surgery. He is being evaluated at [facility] on 06/02/16 and isn't sure what all needs to be done. He will have to come off his Brilinta. He denies any chest pain, palpitations, dyspnea, dyspnea on exertion, dizziness or presyncope. He cannot walk any distance at all because of his back pain. He struggles to get from his bedroom to the couch every day. He smokes a half a pack of cigarettes daily. Blood pressure is extremely high today but he says he just took his medications with right before walking in here. He was in too much pain this morning and forgot to take them.   Past Medical History:  Diagnosis Date  . Anxiety   . Arthritis   . Chronic back pain   . Chronic neck pain   . Coronary artery disease    a. 2000: s/p PCI/stenting of prox LAD and RCA @ [facility];  b. 09/2012 Cath/PCI: LM 30d, LAD 40p ISR, 6m, 55m/d (2.25x20 Promus Premier DES), LCX 30p, RCA 80p (3.0x24 Promus Premier DES), patent mid stent, 36m, PDA 42m (2.75x28 Promus Premier DES), EF 60-65%. c. cath 09/26/2014 EF 65%, patent LAD stents, DES 2.5 x 12 Synergy to mid LAD, 80% RCA stenosis treated with balloon angio  . Diabetes mellitus   . Dysrhythmia   . GERD (gastroesophageal reflux disease)   . Headache   . Hyperlipidemia   . Hypertension   . Myocardial infarction   . Neuromuscular disorder (HCC)   . Tobacco abuse     Past Surgical History:  Procedure Laterality Date  .  APPENDECTOMY    . BACK SURGERY    . CARDIAC CATHETERIZATION    . cardiac stents    . CORONARY ANGIOPLASTY    . LEFT HEART CATHETERIZATION WITH CORONARY ANGIOGRAM N/A 10/11/2012   Procedure: LEFT HEART CATHETERIZATION WITH CORONARY ANGIOGRAM;  Surgeon: Laurey Morale, MD;  Location: [facility] CATH LAB;  Service: Cardiovascular;  Laterality: N/A;  . LEFT HEART CATHETERIZATION WITH CORONARY ANGIOGRAM N/A 09/26/2014   Procedure: LEFT HEART CATHETERIZATION WITH CORONARY ANGIOGRAM;  Surgeon: Corky Crafts, MD;  Location: [facility] CATH LAB;  Service: Cardiovascular;  Laterality: N/A;  . MULTIPLE EXTRACTIONS WITH ALVEOLOPLASTY N/A 01/22/2015   Procedure: MULTIPLE EXTRACTIONS # 2, 4, 5, 6, 7, 10, 11, 12, 13, 15, 20, 21, 22, 23, 24, 25, 26, 27, 28, 31, and Alveloplasty;  Surgeon: Ocie Doyne, DDS;  Location: [facility] OR;  Service: Oral Surgery;  Laterality: N/A;  . PERCUTANEOUS CORONARY STENT INTERVENTION (PCI-S) N/A 10/14/2012   Procedure: PERCUTANEOUS CORONARY STENT INTERVENTION (PCI-S);  Surgeon: Tonny Bollman, MD;  Location: [facility] CATH LAB;  Service: Cardiovascular;  Laterality: N/A;  . PERCUTANEOUS CORONARY STENT INTERVENTION (PCI-S)  09/26/2014   Procedure: PERCUTANEOUS CORONARY STENT INTERVENTION (PCI-S);  Surgeon: Corky Crafts, MD;  Location: [facility] CATH LAB;  Service: Cardiovascular;;  . stents placed  09-2014   had heart attack  . TONSILLECTOMY  Current Medications: Outpatient Medications Prior to Visit  Medication Sig Dispense Refill  . ASPERCREME W/LIDOCAINE EX Apply 1 application topically daily as needed (pain).    Marland Kitchen. aspirin EC 81 MG tablet Take 81 mg by mouth every morning.     Marland Kitchen. atorvastatin (LIPITOR) 80 MG tablet Take 1 tablet (80 mg total) by mouth daily at 6 PM. 30 tablet 11  . BRILINTA 90 MG TABS tablet TAKE ONE TABLET BY MOUTH TWICE DAILY 180 tablet 1  . cetirizine (ZYRTEC) 10 MG tablet Take 10 mg by mouth daily as needed for allergies.    Marland Kitchen. gabapentin (NEURONTIN) 600 MG tablet Take 600 mg by  mouth every 6 (six) hours.     Marland Kitchen. lisinopril-hydrochlorothiazide (PRINZIDE,ZESTORETIC) 20-25 MG per tablet Take 1 tablet by mouth daily.    . metFORMIN (GLUCOPHAGE) 500 MG tablet Take 500 mg by mouth 2 (two) times daily with a meal.    . metoprolol tartrate (LOPRESSOR) 25 MG tablet Take 1 tablet (25 mg total) by mouth 2 (two) times daily. 60 tablet 11  . nitroGLYCERIN (NITROSTAT) 0.4 MG SL tablet Place 1 tablet (0.4 mg total) under the tongue every 5 (five) minutes as needed for chest pain. 25 tablet 1  . pantoprazole (PROTONIX) 40 MG tablet Take 40 mg by mouth daily as needed (acid reflux).     . polyethylene glycol-electrolytes (NULYTELY/GOLYTELY) 420 G solution Take 4,000 mLs by mouth once. 4000 mL 0  . ranitidine (ZANTAC) 150 MG tablet Take 150 mg by mouth 2 (two) times daily.    . tizanidine (ZANAFLEX) 2 MG capsule Take 2 mg by mouth 3 (three) times daily as needed for muscle spasms.    . traMADol (ULTRAM) 50 MG tablet Take 1 tab by mouth q6 hrs prn     No facility-administered medications prior to visit.      Allergies:   Patient has no known allergies.   Social History   Social History  . Marital status: Divorced    Spouse name: N/A  . Number of children: 2  . Years of education: N/A   Occupational History  . Unemployed    Social History Main Topics  . Smoking status: Current Every Day Smoker    Packs/day: 0.50    Types: Cigarettes    Start date: 07/24/1977  . Smokeless tobacco: Former NeurosurgeonUser  . Alcohol use No  . Drug use: No  . Sexual activity: No   Other Topics Concern  . None   Social History Narrative  . None     Family History:  The patient's family history includes Hypertension in his father.   ROS:   Please see the history of present illness.    Review of Systems  Constitution: Positive for malaise/fatigue.  HENT: Negative.   Cardiovascular: Negative.   Respiratory: Negative.   Endocrine: Negative.   Hematologic/Lymphatic: Negative.   Musculoskeletal:  Positive for arthritis, back pain, muscle weakness, myalgias and stiffness.  Gastrointestinal: Negative.   Genitourinary: Negative.   Neurological: Negative.    All other systems reviewed and are negative.   PHYSICAL EXAM:   VS:  BP (!) 172/102   Pulse 67   Ht 6\' 3"  (1.905 m)   Wt 251 lb (113.9 kg)   SpO2 98%   BMI 31.37 kg/m   Physical Exam  GEN: Obese, in no acute distress  Neck: no JVD, carotid bruits, or masses Cardiac:RRR; positive S4, no murmurs, rubs Respiratory:  clear to auscultation bilaterally, normal work of breathing GI:  soft, nontender, nondistended, + BS Ext: without cyanosis, clubbing, or edema, Good distal pulses bilaterally MS: no deformity or atrophy  Skin: warm and dry, no rash Psych: euthymic mood, full affect  Wt Readings from Last 3 Encounters:  05/31/16 251 lb (113.9 kg)  06/24/15 261 lb (118.4 kg)  01/22/15 247 lb (112 kg)      Studies/Labs Reviewed:   EKG:  EKG is ordered today.  The ekg ordered today demonstrates Normal sinus rhythm poor R wave progression anteriorly consistent with old infarct, no acute change  Recent Labs: No results found for requested labs within last 8760 hours.   Lipid Panel    Component Value Date/Time   CHOL 187 10/10/2012 1036   TRIG 173 (H) 10/10/2012 1036   HDL 27 (L) 10/10/2012 1036   CHOLHDL 6.9 10/10/2012 1036   VLDL 35 10/10/2012 1036   LDLCALC 125 (H) 10/10/2012 1036    Additional studies/ records that were reviewed today include:  Cardiac catheterization 09/26/14  IMPRESSIONS:   1. Normal left main coronary artery. 2. Patent stents in the proximal and mid left anterior descending artery with mild in-stent restenosis. To note the lesion in the mid LAD just after a large diagonal branch. This was successfully stented with a 2.5 x 12 Synergy drug-eluting stent, postdilated to greater than 2.75 mm. 3. Mild disease in the left circumflex artery and its branches. 4. Focal area of restenosis in the mid  right coronary artery prior stents.  This was treated with cutting balloon angioplasty with a 3.5 x 10 angina balloon with no residual stenosis. 5. Normal left ventricular systolic function.  LVEDP 7 mmHg.  Ejection fraction 65%.   RECOMMENDATION:  Continue dual antiplatelet therapy for at least a year. He needs aggressive secondary prevention including smoking cessation. We used Brilinta today. If there are issues with him affording the Brilinta long-term, I would load him with 300 mg of Plavix and then switch him to clopidogrel 75 mg daily.       Electronically signed by Corky CraftsJayadeep S Varanasi, MD at 09/26/2014  7:02 PM     ASSESSMENT:    1. Preoperative cardiovascular examination   2. CAD in native artery   3. Essential hypertension   4. Tobacco use      PLAN:  In order of problems listed above:  Preoperative cardiovascular examination before [facility]. Being evaluated there 06/02/16. Patient has history of multiple stents in the LAD and RCA cutting balloon angioplasty of the RCA and stenting of the mid LAD 09/2014. LVEF 65%. Patient is debilitated from back pain and cannot walk any distance. He also continues to smoke a half a pack of cigarettes daily. Discussed this patient with Dr. Eden EmmsNishan who agrees the patient needs to be evaluated with a Lexi scan Myoview before cleaning for surgery. He will also need to come off his Brilinta for the surgery but has not been told how long. Appointment to be established with cardiologist in [place].  Essential hypertension blood pressure is very high today. He did not take his indications until right before he walked in here. He is in a lot of pain as well. We'll not make any changes today. Continue lisinopril HCTZ and metoprolol  Tobacco abuse smoking cessation discussed    Medication Adjustments/Labs and Tests Ordered: Current medicines are reviewed at length with the patient today.  Concerns regarding medicines are outlined  above.  Medication changes, Labs and Tests ordered today are listed in the Patient Instructions  below. Patient Instructions  Your physician recommends that you schedule a follow-up appointment after your test.   Your physician recommends that you continue on your current medications as directed. Please refer to the Current Medication list given to you today.  Your physician has requested that you have a lexiscan myoview. For further information please visit [email or web address] Please follow instruction sheet, as given.  If you need a refill on your cardiac medications before your next appointment, please call your pharmacy.  Thank you for choosing [facility]!       Signed, Jacolyn Reedy, PA-C  05/31/2016 1:58 PM    [facility] [address] [place], [place], [place]  [address] Phone: [phone]; Fax: [phone]

## 2016-05-31 ENCOUNTER — Encounter: Payer: Self-pay | Admitting: Physician Assistant

## 2016-05-31 ENCOUNTER — Encounter: Payer: Self-pay | Admitting: *Deleted

## 2016-05-31 ENCOUNTER — Ambulatory Visit (INDEPENDENT_AMBULATORY_CARE_PROVIDER_SITE_OTHER): Payer: Medicaid Other | Admitting: Physician Assistant

## 2016-05-31 VITALS — BP 172/102 | HR 67 | Ht 75.0 in | Wt 251.0 lb

## 2016-05-31 DIAGNOSIS — I10 Essential (primary) hypertension: Secondary | ICD-10-CM | POA: Diagnosis not present

## 2016-05-31 DIAGNOSIS — Z01810 Encounter for preprocedural cardiovascular examination: Secondary | ICD-10-CM

## 2016-05-31 DIAGNOSIS — I2510 Atherosclerotic heart disease of native coronary artery without angina pectoris: Secondary | ICD-10-CM | POA: Diagnosis not present

## 2016-05-31 DIAGNOSIS — E7800 Pure hypercholesterolemia, unspecified: Secondary | ICD-10-CM

## 2016-05-31 DIAGNOSIS — Z720 Tobacco use: Secondary | ICD-10-CM | POA: Diagnosis not present

## 2016-05-31 NOTE — Patient Instructions (Signed)
Your physician recommends that you schedule a follow-up appointment after your test.   Your physician recommends that you continue on your current medications as directed. Please refer to the Current Medication list given to you today.  Your physician has requested that you have a lexiscan myoview. For further information please visit [email or web address] Please follow instruction sheet, as given.  If you need a refill on your cardiac medications before your next appointment, please call your pharmacy.  Thank you for choosing [facility]!

## 2016-06-09 ENCOUNTER — Inpatient Hospital Stay (HOSPITAL_COMMUNITY): Admission: RE | Admit: 2016-06-09 | Payer: Medicaid Other | Source: Ambulatory Visit

## 2016-06-09 ENCOUNTER — Encounter (HOSPITAL_COMMUNITY): Payer: Medicaid Other

## 2016-06-13 ENCOUNTER — Encounter (HOSPITAL_COMMUNITY): Admission: RE | Admit: 2016-06-13 | Payer: Medicaid Other | Source: Ambulatory Visit

## 2016-06-13 ENCOUNTER — Encounter (HOSPITAL_COMMUNITY): Payer: Self-pay

## 2016-06-13 ENCOUNTER — Encounter (HOSPITAL_COMMUNITY): Payer: Medicaid Other

## 2016-06-21 ENCOUNTER — Encounter: Payer: Self-pay | Admitting: Cardiology

## 2016-06-21 NOTE — Progress Notes (Deleted)
Cardiology Office Note  Date: 06/21/2016   ID: EFE FAZZINO, DOB April 25, 1961, MRN [id number]  PCP: Bronson Curb, PA-C  Evaluating Cardiologist: Rozann Lesches, MD   No chief complaint on file.   History of Present Illness: Clayton Foster is a 55 y.o. male that I am meeting for the first time in clinic today. I reviewed extensive records and updated his chart. He had previously followed by Dr. Aundra Dubin, has also seen Ms. Lawrence NP, and more recently Ms. Vita Barley earlier in November. He has been in the process of undergoing preoperative assessment prior to planned dorsal stimulator trial for back pain at [facility]. It was requested that he come off of Brilinta for this procedure. He was referred for a Lexiscan Myoview under the direction of Dr. Johnsie Cancel, although do not see that the study was ever obtained.  Past Medical History:  Diagnosis Date  . Anxiety   . Arthritis   . Chronic back pain   . Chronic neck pain   . Coronary artery disease    a. 2000: s/p PCI/stenting of prox LAD and RCA @ [facility];  b. 09/2012 Cath/PCI: LM 30d, LAD 40p ISR, 61m 981m (2.25x20 Promus Premier DES), LCX 30p, RCA 80p (3.0x24 Promus Premier DES), patent mid stent, 4049mDA 46m34m75x28 Promus Premier DES), EF 60-65%. c. cath 09/26/2014 EF 65%, patent LAD stents, DES 2.5 x 12 Synergy to mid LAD, 80% RCA stenosis treated with balloon angio  . Essential hypertension   . GERD (gastroesophageal reflux disease)   . Headache   . Hyperlipidemia   . Myocardial infarction   . Neuromuscular disorder (HCC)[place]. Type 2 diabetes mellitus (HCC)[place]  Past Surgical History:  Procedure Laterality Date  . APPENDECTOMY    . BACK SURGERY    . CARDIAC CATHETERIZATION    . CORONARY ANGIOPLASTY    . LEFT HEART CATHETERIZATION WITH CORONARY ANGIOGRAM N/A 10/11/2012   Procedure: LEFT HEART CATHETERIZATION WITH CORONARY ANGIOGRAM;  Surgeon: DaltLarey Dresser;  [facility];  Service: Cardiovascular;   Laterality: N/A;  . LEFT HEART CATHETERIZATION WITH CORONARY ANGIOGRAM N/A 09/26/2014   Procedure: LEFT HEART CATHETERIZATION WITH CORONARY ANGIOGRAM;  Surgeon: JayaJettie Booze;  [facility];  Service: Cardiovascular;  Laterality: N/A;  . MULTIPLE EXTRACTIONS WITH ALVEOLOPLASTY N/A 01/22/2015   Procedure: MULTIPLE EXTRACTIONS # 2, 4, 5, 6, 7, 10, 11, 12, 13, 15, 20, 21, 22, 23, 24, 25, 26, 27, 28, 31, and Alveloplasty;  Surgeon: ScotDiona BrownerS;  [facility];  Laterality: N/A;  . PERCUTANEOUS CORONARY STENT INTERVENTION (PCI-S) N/A 10/14/2012   Procedure: PERCUTANEOUS CORONARY STENT INTERVENTION (PCI-S);  Surgeon: MichSherren Mocha;  [facility];  Service: Cardiovascular;  Laterality: N/A;  . PERCUTANEOUS CORONARY STENT INTERVENTION (PCI-S)  09/26/2014   Procedure: PERCUTANEOUS CORONARY STENT INTERVENTION (PCI-S);  Surgeon: JayaJettie Booze;  [facility];  Service: Cardiovascular;;  . TONSILLECTOMY      Current Outpatient Prescriptions  Medication Sig Dispense Refill  . ASPERCREME W/LIDOCAINE EX Apply 1 application topically daily as needed (pain).    . asMarland Kitchenirin EC 81 MG tablet Take 81 mg by mouth every morning.     . atMarland Kitchenrvastatin (LIPITOR) 80 MG tablet Take 1 tablet (80 mg total) by mouth daily at 6 PM. 30 tablet 11  . BRILINTA 90 MG TABS tablet TAKE ONE TABLET BY MOUTH TWICE DAILY 180 tablet 1  .  cetirizine (ZYRTEC) 10 MG tablet Take 10 mg by mouth daily as needed for allergies.    Marland Kitchen gabapentin (NEURONTIN) 600 MG tablet Take 600 mg by mouth every 6 (six) hours.     Marland Kitchen lisinopril-hydrochlorothiazide (PRINZIDE,ZESTORETIC) 20-25 MG per tablet Take 1 tablet by mouth daily.    . metFORMIN (GLUCOPHAGE) 500 MG tablet Take 500 mg by mouth 2 (two) times daily with a meal.    . metoprolol tartrate (LOPRESSOR) 25 MG tablet Take 1 tablet (25 mg total) by mouth 2 (two) times daily. 60 tablet 11  . nitroGLYCERIN (NITROSTAT) 0.4 MG SL  tablet Place 1 tablet (0.4 mg total) under the tongue every 5 (five) minutes as needed for chest pain. 25 tablet 1  . pantoprazole (PROTONIX) 40 MG tablet Take 40 mg by mouth daily as needed (acid reflux).     . polyethylene glycol-electrolytes (NULYTELY/GOLYTELY) 420 G solution Take 4,000 mLs by mouth once. 4000 mL 0  . ranitidine (ZANTAC) 150 MG tablet Take 150 mg by mouth 2 (two) times daily.    . tizanidine (ZANAFLEX) 2 MG capsule Take 2 mg by mouth 3 (three) times daily as needed for muscle spasms.    . traMADol (ULTRAM) 50 MG tablet Take 1 tab by mouth q6 hrs prn     No current facility-administered medications for this visit.    Allergies:  Patient has no known allergies.   Social History: The patient  reports that he has been smoking Cigarettes.  He started smoking about 38 years ago. He has been smoking about 0.50 packs per day. He has quit using smokeless tobacco. He reports that he does not drink alcohol or use drugs.   Family History: The patient's family history includes Hypertension in his father.   ROS:  Please see the history of present illness. Otherwise, complete review of systems is positive for {NONE DEFAULTED:18576::"none"}.  All other systems are reviewed and negative.   Physical Exam: VS:  There were no vitals taken for this visit., BMI There is no height or weight on file to calculate BMI.  Wt Readings from Last 3 Encounters:  05/31/16 251 lb (113.9 kg)  06/24/15 261 lb (118.4 kg)  01/22/15 247 lb (112 kg)    General: Patient appears comfortable at rest. HEENT: Conjunctiva and lids normal, oropharynx clear with moist mucosa. Neck: Supple, no elevated JVP or carotid bruits, no thyromegaly. Lungs: Clear to auscultation, nonlabored breathing at rest. Cardiac: Regular rate and rhythm, no S3 or significant systolic murmur, no pericardial rub. Abdomen: Soft, nontender, no hepatomegaly, bowel sounds present, no guarding or rebound. Extremities: No pitting edema, distal  pulses 2+. Skin: Warm and dry. Musculoskeletal: No kyphosis. Neuropsychiatric: Alert and oriented x3, affect grossly appropriate.  ECG: I personally reviewed the tracing from 05/31/2016 which showed sinus bradycardia with poor R-wave progression, rule out old anterior infarct pattern.  Recent Labwork:  November 2016: Cholesterol 97, triglycerides 260, HDL 22, LDL 23, BUN 4, creatinine 0.8, potassium 4.1, AST 20, ALT 35  Other Studies Reviewed Today:  Echocardiogram 11/12/2013: Study Conclusions  - Study data: Technically difficult study. - Left ventricle: The cavity size was normal. Wall thickness was normal. Systolic function was normal. The estimated ejection fraction was in the range of 60% to 65%. - Aortic valve: Valve area: 2.75cm^2(VTI). Valve area: 3.78cm^2 (Vmax).  Cardiac catheterization and PCI 09/26/2014: HEMODYNAMICS:  Aortic pressure was 96/61; LV pressure was 102/0; LVEDP 7.  There was no gradient between the left ventricle and aorta.  ANGIOGRAPHIC DATA:   The left main coronary artery is widely patent.  The left anterior descending artery is a large vessel which has multiple stents. There is mild restenosis in the proximal LAD. In the mid LAD, after a large diagonal, there is a 95% stenosis. There is TIMI-3 flow in the vessel. The LAD wraps around the apex..  The left circumflex artery is a large vessel. There is mild disease proximally. There is a large OM1 which branches across the lateral wall. The OM 2 is medium-sized and widely patent.  The right coronary artery is a large, dominant vessel. There are multiple stents in the RCA. There is an 80% in-stent restenosis lesion just after a large RV marginal branch. The distal RCA is moderately diseased. In the PDA, there is a distal 80% lesion which is unchanged from prior. The posterior lateral artery is patent.  LEFT VENTRICULOGRAM:  Left ventricular angiogram was done in the 30 RAO projection and revealed  normal left ventricular wall motion and systolic function with an estimated ejection fraction of 65%.  LVEDP was 7 mmHg.  PCI NARRATIVE: A CLS 3.5 guiding catheter was used to engage the left main. IV heparin and IV tirofiban were used for anticoagulation. ACT was used to check that the heparin was therapeutic. A BMW wire was placed across the area disease in the LAD. A fielder XT wire was placed into the diagonal vessel which originated just before the lesion.  A 2.0 x 12 balloon was used to predilate. A 2.5 x 12 balloon was used to further predilate with multiple inflations. A 2.5 x 16 Synergy drug-eluting stent was then deployed to overlap with the distal area of stent and still cover the lesion. The stent was postdilated with a 2.75 noncompliant balloon. During this process, the diagonal was rewired through the stent struts. The diagonal maintained TIMI-3 flow. There was an excellent angiographic result with no residual stenosis.  A JR4 guiding catheter was used to engage the RCA. A pro-water wire was placed across the area of disease. A 3.5 x 10 MG a sculpt scoring balloon was then inflated to 12 atm. The stenosis was resolved. There was an excellent angiographic result with no residual stenosis.  IMPRESSIONS:  1. Normal left main coronary artery. 2. Patent stents in the proximal and mid left anterior descending artery with mild in-stent restenosis. To note the lesion in the mid LAD just after a large diagonal branch. This was successfully stented with a 2.5 x 12 Synergy drug-eluting stent, postdilated to greater than 2.75 mm. 3. Mild disease in the left circumflex artery and its branches. 4. Focal area of restenosis in the mid right coronary artery prior stents.  This was treated with cutting balloon angioplasty with a 3.5 x 10 angina balloon with no residual stenosis. 5. Normal left ventricular systolic function.  LVEDP 7 mmHg.  Ejection fraction 65%.  Assessment and Plan:   Current  medicines were reviewed with the patient today.  No orders of the defined types were placed in this encounter.   Disposition:  Signed, Satira Sark, MD, [facility] 06/21/2016 2:00 PM    [facility] [place]. [address] [place], [place] [address] Phone: [phone]; Fax: [phone]

## 2016-06-22 ENCOUNTER — Encounter: Payer: Self-pay | Admitting: Cardiology

## 2016-06-22 ENCOUNTER — Ambulatory Visit: Payer: Medicaid Other | Admitting: Cardiology

## 2016-08-02 ENCOUNTER — Encounter (HOSPITAL_COMMUNITY): Payer: Self-pay

## 2016-08-02 ENCOUNTER — Inpatient Hospital Stay (HOSPITAL_COMMUNITY): Admission: RE | Admit: 2016-08-02 | Payer: Medicaid Other | Source: Ambulatory Visit

## 2016-08-02 ENCOUNTER — Encounter (HOSPITAL_COMMUNITY)
Admission: RE | Admit: 2016-08-02 | Discharge: 2016-08-02 | Disposition: A | Discharge status: Home / Self Care | Payer: Medicaid Other | Source: Ambulatory Visit | Attending: Physician Assistant | Admitting: Physician Assistant

## 2016-08-02 DIAGNOSIS — I2510 Atherosclerotic heart disease of native coronary artery without angina pectoris: Secondary | ICD-10-CM | POA: Diagnosis not present

## 2016-08-02 LAB — NM MYOCAR MULTI W/SPECT W/WALL MOTION / EF
LV dias vol: 112 mL (ref 62–150)
LV sys vol: 51 mL
Peak HR: 80 {beats}/min
RATE: 0.45
Rest HR: 53 {beats}/min
SDS: 3
SRS: 2
SSS: 5
TID: 1.14

## 2016-08-02 MED ORDER — REGADENOSON 0.4 MG/5ML IV SOLN
INTRAVENOUS | Status: AC
  Administered 2016-08-02: 0.4 mg via INTRAVENOUS
  Filled 2016-08-02: qty 5

## 2016-08-02 MED ORDER — TECHNETIUM TC 99M TETROFOSMIN IV KIT
10.0000 | PACK | Freq: Once | INTRAVENOUS | Status: AC | PRN
  Administered 2016-08-02: 11.1 via INTRAVENOUS

## 2016-08-02 MED ORDER — TECHNETIUM TC 99M TETROFOSMIN IV KIT
30.0000 | PACK | Freq: Once | INTRAVENOUS | Status: AC | PRN
  Administered 2016-08-02: 33 via INTRAVENOUS

## 2016-08-02 MED ORDER — SODIUM CHLORIDE 0.9% FLUSH
INTRAVENOUS | Status: AC
  Administered 2016-08-02: 10 mL via INTRAVENOUS
  Filled 2016-08-02: qty 10

## 2016-08-03 ENCOUNTER — Telehealth: Payer: Self-pay | Admitting: Cardiology

## 2016-08-03 NOTE — Telephone Encounter (Signed)
Pt is needing a clearance sent to his Dr. For his back surgery. Dr. Noralee StainJohn Barr is doing the surgery @ [facility], fax # [phone] Attn [phone]

## 2016-08-03 NOTE — Telephone Encounter (Signed)
He can stop Brilinta as it has been one year since he started it. He is to take ASA 81 mg daily.

## 2016-08-03 NOTE — Telephone Encounter (Signed)
Spoke with pt, and he is wanting to know why he has to continue taking Brilinta when he was originally told he would only have to take it for one year. The pt stated that he is having back surgery and he would like to come off of it completely. Please advise.

## 2016-08-03 NOTE — Telephone Encounter (Signed)
Pt made aware, he voiced understanding.  

## 2016-08-22 ENCOUNTER — Encounter: Payer: Self-pay | Admitting: Cardiology

## 2016-08-22 NOTE — Progress Notes (Deleted)
Cardiology Office Note  Date: 08/22/2016   ID: Clayton Foster, DOB 08-25-1960, MRN [id number]  PCP: Quinn AxeOBERTSON, Clayton T, PA-C  Evaluating Cardiologist: [name], MD   No chief complaint on file.   History of Present Illness: Clayton Foster is a 56 y.o. male that I meeting for the first time in the office today. He is a former patient of Dr. Shirlee Foster, most recently saw Ms. Clayton ShookLenze PA-C in November 2017.  [facility], it is requested that he come off of Brilinta for 7 days prior to that procedure.  Recent Myoview from January 10 was low risk as outlined below.  Past Medical History:  Diagnosis Date  . Anxiety   . Arthritis   . Chronic back pain   . Chronic neck pain   . Coronary artery disease    a. 2000: s/p PCI/stenting of prox LAD and RCA @ [facility];  b. 09/2012 Cath/PCI: LM 30d, LAD 40p ISR, 3426m, 6112m/d (2.25x20 Promus Premier DES), LCX 30p, RCA 80p (3.0x24 Promus Premier DES), patent mid stent, 10326m, PDA 655m (2.75x28 Promus Premier DES), EF 60-65%. c. cath 09/26/2014 EF 65%, patent LAD stents, DES 2.5 x 12 Synergy to mid LAD, 80% RCA stenosis treated with balloon angio  . Essential hypertension   . GERD (gastroesophageal reflux disease)   . Headache   . Hyperlipidemia   . Myocardial infarction   . Neuromuscular disorder (HCC)   . Type 2 diabetes mellitus (HCC)     Past Surgical History:  Procedure Laterality Date  . APPENDECTOMY    . BACK SURGERY    . CARDIAC CATHETERIZATION    . CORONARY ANGIOPLASTY    . LEFT HEART CATHETERIZATION WITH CORONARY ANGIOGRAM N/A 10/11/2012   Procedure: LEFT HEART CATHETERIZATION WITH CORONARY ANGIOGRAM;  Surgeon: Laurey Moralealton S McLean, MD;  [facility];  Service: Cardiovascular;  Laterality: N/A;  . LEFT HEART CATHETERIZATION WITH CORONARY ANGIOGRAM N/A 09/26/2014   Procedure: LEFT HEART CATHETERIZATION WITH CORONARY ANGIOGRAM;  Surgeon: Corky CraftsJayadeep S Varanasi, MD;  [facility];   Service: Cardiovascular;  Laterality: N/A;  . MULTIPLE EXTRACTIONS WITH ALVEOLOPLASTY N/A 01/22/2015   Procedure: MULTIPLE EXTRACTIONS # 2, 4, 5, 6, 7, 10, 11, 12, 13, 15, 20, 21, 22, 23, 24, 25, 26, 27, 28, 31, and Alveloplasty;  Surgeon: Ocie DoyneScott Jensen, DDS;  Location: [facility] OR;  Service: Oral Surgery;  Laterality: N/A;  . PERCUTANEOUS CORONARY STENT INTERVENTION (PCI-S) N/A 10/14/2012   Procedure: PERCUTANEOUS CORONARY STENT INTERVENTION (PCI-S);  Surgeon: Tonny BollmanMichael Cooper, MD;  [facility];  Service: Cardiovascular;  Laterality: N/A;  . PERCUTANEOUS CORONARY STENT INTERVENTION (PCI-S)  09/26/2014   Procedure: PERCUTANEOUS CORONARY STENT INTERVENTION (PCI-S);  Surgeon: Corky CraftsJayadeep S Varanasi, MD;  [facility];  Service: Cardiovascular;;  . TONSILLECTOMY      Current Outpatient Prescriptions  Medication Sig Dispense Refill  . ASPERCREME W/LIDOCAINE EX Apply 1 application topically daily as needed (pain).    Marland Kitchen. aspirin EC 81 MG tablet Take 81 mg by mouth every morning.     Marland Kitchen. atorvastatin (LIPITOR) 80 MG tablet Take 1 tablet (80 mg total) by mouth daily at 6 PM. 30 tablet 11  . BRILINTA 90 MG TABS tablet TAKE ONE TABLET BY MOUTH TWICE DAILY 180 tablet 1  . cetirizine (ZYRTEC) 10 MG tablet Take 10 mg by mouth daily as needed for allergies.    Marland Kitchen. gabapentin (NEURONTIN) 600 MG tablet Take 600 mg by  mouth every 6 (six) hours.     Marland Kitchen lisinopril-hydrochlorothiazide (PRINZIDE,ZESTORETIC) 20-25 MG per tablet Take 1 tablet by mouth daily.    . metFORMIN (GLUCOPHAGE) 500 MG tablet Take 500 mg by mouth 2 (two) times daily with a meal.    . metoprolol tartrate (LOPRESSOR) 25 MG tablet Take 1 tablet (25 mg total) by mouth 2 (two) times daily. 60 tablet 11  . nitroGLYCERIN (NITROSTAT) 0.4 MG SL tablet Place 1 tablet (0.4 mg total) under the tongue every 5 (five) minutes as needed for chest pain. 25 tablet 1  . pantoprazole (PROTONIX) 40 MG tablet Take 40 mg by mouth daily as needed (acid reflux).     .  polyethylene glycol-electrolytes (NULYTELY/GOLYTELY) 420 G solution Take 4,000 mLs by mouth once. 4000 mL 0  . ranitidine (ZANTAC) 150 MG tablet Take 150 mg by mouth 2 (two) times daily.    . tizanidine (ZANAFLEX) 2 MG capsule Take 2 mg by mouth 3 (three) times daily as needed for muscle spasms.    . traMADol (ULTRAM) 50 MG tablet Take 1 tab by mouth q6 hrs prn     No current facility-administered medications for this visit.    Allergies:  Patient has no known allergies.   Social History: The patient  reports that he has been smoking Cigarettes.  He started smoking about 39 years ago. He has been smoking about 0.50 packs per day. He has quit using smokeless tobacco. He reports that he does not drink alcohol or use drugs.   Family History: The patient's family history includes Hypertension in his father.   ROS:  Please see the history of present illness. Otherwise, complete review of systems is positive for {NONE DEFAULTED:18576::"none"}.  All other systems are reviewed and negative.   Physical Exam: VS:  There were no vitals taken for this visit., BMI There is no height or weight on file to calculate BMI.  Wt Readings from Last 3 Encounters:  05/31/16 251 lb (113.9 kg)  06/24/15 261 lb (118.4 kg)  01/22/15 247 lb (112 kg)    General: Patient appears comfortable at rest. HEENT: Conjunctiva and lids normal, oropharynx clear with moist mucosa. Neck: Supple, no elevated JVP or carotid bruits, no thyromegaly. Lungs: Clear to auscultation, nonlabored breathing at rest. Cardiac: Regular rate and rhythm, no S3 or significant systolic murmur, no pericardial rub. Abdomen: Soft, nontender, no hepatomegaly, bowel sounds present, no guarding or rebound. Extremities: No pitting edema, distal pulses 2+. Skin: Warm and dry. Musculoskeletal: No kyphosis. Neuropsychiatric: Alert and oriented x3, affect grossly appropriate.  ECG: I personally reviewed the tracing from 05/31/2016 which showed sinus  bradycardia with poor R wave progression.  Recent Labwork:  November 2016: Cholesterol 97, HDL 22, LDL 23, triglycerides 260, BUN 4, creatinine 0.8, potassium 4.1, AST 20, ALT 35  Other Studies Reviewed Today:  Echocardiogram 11/12/2013: Study Conclusions  - Study data: Technically difficult study. - Left ventricle: The cavity size was normal. Wall thickness was normal. Systolic function was normal. The estimated ejection fraction was in the range of 60% to 65%. - Aortic valve: Valve area: 2.75cm^2(VTI). Valve area: 3.78cm^2 (Vmax).  Lexiscan Myoview 08/02/2016:  There was no ST segment deviation noted during stress.  Defect 1: There is a medium defect of mild severity present in the mid inferoseptal, mid inferior and mid inferolateral location.  Aforementioned defect indicative of probable soft tissue attenuation artifact. A degree of scar cannot entirely be ruled out.  Nuclear stress EF: 54%.  This is a  low risk study.  Assessment and Plan:   Current medicines were reviewed with the patient today.  No orders of the defined types were placed in this encounter.   Disposition:  Signed, Jonelle Sidle, MD, [facility] 08/22/2016 1:21 PM    [facility] [address] [place], [place] [address] Phone: [phone]; Fax: [phone]

## 2016-08-23 ENCOUNTER — Ambulatory Visit: Payer: Medicaid Other | Admitting: Cardiology

## 2016-08-23 ENCOUNTER — Encounter: Payer: Self-pay | Admitting: Cardiology

## 2016-11-10 ENCOUNTER — Encounter (INDEPENDENT_AMBULATORY_CARE_PROVIDER_SITE_OTHER): Payer: Self-pay | Admitting: *Deleted

## 2017-01-09 ENCOUNTER — Other Ambulatory Visit (HOSPITAL_COMMUNITY): Payer: Self-pay | Admitting: Orthopaedic Surgery

## 2017-01-09 DIAGNOSIS — M5136 Other intervertebral disc degeneration, lumbar region: Secondary | ICD-10-CM

## 2017-01-17 ENCOUNTER — Ambulatory Visit (HOSPITAL_COMMUNITY): Payer: Medicaid Other

## 2017-01-17 ENCOUNTER — Ambulatory Visit (HOSPITAL_COMMUNITY)
Admission: RE | Admit: 2017-01-17 | Discharge: 2017-01-17 | Disposition: A | Discharge status: Home / Self Care | Payer: Medicaid Other | Source: Ambulatory Visit | Attending: Orthopaedic Surgery | Admitting: Orthopaedic Surgery

## 2017-01-17 DIAGNOSIS — Z981 Arthrodesis status: Secondary | ICD-10-CM | POA: Insufficient documentation

## 2017-01-17 DIAGNOSIS — M5136 Other intervertebral disc degeneration, lumbar region: Secondary | ICD-10-CM | POA: Insufficient documentation

## 2017-01-17 DIAGNOSIS — M5126 Other intervertebral disc displacement, lumbar region: Secondary | ICD-10-CM | POA: Insufficient documentation

## 2017-02-26 NOTE — Progress Notes (Signed)
Cardiology Office Note    Date:  02/27/2017   ID:  AGAM DAVENPORT, DOB 01-Jul-1961, MRN [id number]  PCP:  System, Provider Not In  Cardiologist: Was Dr. Shirlee Latch -> [place]   No chief complaint on file.   History of Present Illness:  Clayton Foster is a 56 y.o. male with history of CAD status post NSTEMI treated with DES to the mid LAD and PTCA to the RCA 09/2014.Also has hypertension, diabetes mellitus, chronic back pain and anxiety. Last seen by PA 05/2016  I have never seen before   Needed back surgery. He is being evaluated at [facility] on 06/02/16 and isn't sure what all needs to be done.Brillinta stopped  He denies any chest pain, palpitations, dyspnea, dyspnea on exertion, dizziness or presyncope. He cannot walk any distance at all because of his back pain. He struggles to get from his bedroom to the couch every day. He smokes a half a pack of cigarettes daily. Blood pressure is extremely high today but he says he just took his medications with right before walking in here. He was in too much pain this morning and forgot to take them.  Myovue done 08/02/16 EF 54% old IMI no ischemia low risk   Had bilateral L5-S1 foraminal injections at [facility] 09/25/16 Has had previous surgical laminectomy 2000 Thought he was to have evaluation for spinal stimulator but they gave him more injections in January Now is seeing Clayton Foster and slated to have lumbar fusion  From a cardiac perspective he is stable, off Brilinta and low risk myovue 08/02/16 so he is clear for orthopedic surgery  Past Medical History:  Diagnosis Date  . Anxiety   . Arthritis   . Chronic back pain   . Chronic neck pain   . Coronary artery disease    a. 2000: s/p PCI/stenting of prox LAD and RCA @ [facility];  b. 09/2012 Cath/PCI: LM 30d, LAD 40p ISR, 14m, 80m/d (2.25x20 Promus Premier DES), LCX 30p, RCA 80p (3.0x24 Promus Premier DES), patent mid stent, 1m, PDA 23m (2.75x28 Promus Premier DES), EF 60-65%. c. cath 09/26/2014 EF 65%, patent  LAD stents, DES 2.5 x 12 Synergy to mid LAD, 80% RCA stenosis treated with balloon angio  . Essential hypertension   . GERD (gastroesophageal reflux disease)   . Headache   . Hyperlipidemia   . Myocardial infarction (HCC)   . Neuromuscular disorder (HCC)   . Type 2 diabetes mellitus (HCC)     Past Surgical History:  Procedure Laterality Date  . APPENDECTOMY    . BACK SURGERY    . CARDIAC CATHETERIZATION    . CORONARY ANGIOPLASTY    . LEFT HEART CATHETERIZATION WITH CORONARY ANGIOGRAM N/A 10/11/2012   Procedure: LEFT HEART CATHETERIZATION WITH CORONARY ANGIOGRAM;  Surgeon: Laurey Morale, MD;  Location: [facility] CATH LAB;  Service: Cardiovascular;  Laterality: N/A;  . LEFT HEART CATHETERIZATION WITH CORONARY ANGIOGRAM N/A 09/26/2014   Procedure: LEFT HEART CATHETERIZATION WITH CORONARY ANGIOGRAM;  Surgeon: Corky Crafts, MD;  Location: [facility] CATH LAB;  Service: Cardiovascular;  Laterality: N/A;  . MULTIPLE EXTRACTIONS WITH ALVEOLOPLASTY N/A 01/22/2015   Procedure: MULTIPLE EXTRACTIONS # 2, 4, 5, 6, 7, 10, 11, 12, 13, 15, 20, 21, 22, 23, 24, 25, 26, 27, 28, 31, and Alveloplasty;  Surgeon: Ocie Doyne, DDS;  Location: [facility] OR;  Service: Oral Surgery;  Laterality: N/A;  . PERCUTANEOUS CORONARY STENT INTERVENTION (PCI-S) N/A 10/14/2012   Procedure: PERCUTANEOUS CORONARY STENT INTERVENTION (PCI-S);  Surgeon: Casimiro Needle  Excell Seltzer, MD;  Location[facility] LAB;  Service: Cardiovascular;  Laterality: N/A;  . PERCUTANEOUS CORONARY STENT INTERVENTION (PCI-S)  09/26/2014   Procedure: PERCUTANEOUS CORONARY STENT INTERVENTION (PCI-S);  Surgeon: Corky Crafts, MD;  Location: [facility] CATH LAB;  Service: Cardiovascular;;  . TONSILLECTOMY      Current Medications: Outpatient Medications Prior to Visit  Medication Sig Dispense Refill  . ASPERCREME W/LIDOCAINE EX Apply 1 application topically daily as needed (pain).    Marland Kitchen aspirin EC 81 MG tablet Take 81 mg by mouth every morning.     Marland Kitchen atorvastatin (LIPITOR) 80 MG tablet  Take 1 tablet (80 mg total) by mouth daily at 6 PM. 30 tablet 11  . cetirizine (ZYRTEC) 10 MG tablet Take 10 mg by mouth daily as needed for allergies.    Marland Kitchen gabapentin (NEURONTIN) 600 MG tablet Take 600 mg by mouth every 6 (six) hours.     Marland Kitchen lisinopril-hydrochlorothiazide (PRINZIDE,ZESTORETIC) 20-25 MG per tablet Take 1 tablet by mouth daily.    . metFORMIN (GLUCOPHAGE) 500 MG tablet Take 500 mg by mouth 2 (two) times daily with a meal.    . metoprolol tartrate (LOPRESSOR) 25 MG tablet Take 1 tablet (25 mg total) by mouth 2 (two) times daily. 60 tablet 11  . nitroGLYCERIN (NITROSTAT) 0.4 MG SL tablet Place 1 tablet (0.4 mg total) under the tongue every 5 (five) minutes as needed for chest pain. 25 tablet 1  . pantoprazole (PROTONIX) 40 MG tablet Take 40 mg by mouth daily as needed (acid reflux).     . polyethylene glycol-electrolytes (NULYTELY/GOLYTELY) 420 G solution Take 4,000 mLs by mouth once. 4000 mL 0  . ranitidine (ZANTAC) 150 MG tablet Take 150 mg by mouth 2 (two) times daily.    . tizanidine (ZANAFLEX) 2 MG capsule Take 2 mg by mouth 3 (three) times daily as needed for muscle spasms.    . traMADol (ULTRAM) 50 MG tablet Take 1 tab by mouth q6 hrs prn    . BRILINTA 90 MG TABS tablet TAKE ONE TABLET BY MOUTH TWICE DAILY 180 tablet 1   No facility-administered medications prior to visit.      Allergies:   Patient has no known allergies.   Social History   Social History  . Marital status: Divorced    Spouse name: N/A  . Number of children: 2  . Years of education: N/A   Occupational History  . Unemployed    Social History Main Topics  . Smoking status: Current Every Day Smoker    Packs/day: 0.50    Types: Cigarettes    Start date: 07/24/1977  . Smokeless tobacco: Former Neurosurgeon  . Alcohol use No  . Drug use: No  . Sexual activity: No   Other Topics Concern  . None   Social History Narrative  . None     Family History:  The patient's family history includes Hypertension  in his father.   ROS:   Please see the history of present illness.    Review of Systems  Constitution: Positive for malaise/fatigue.  HENT: Negative.   Cardiovascular: Negative.   Respiratory: Negative.   Endocrine: Negative.   Hematologic/Lymphatic: Negative.   Musculoskeletal: Positive for arthritis, back pain, muscle weakness, myalgias and stiffness.  Gastrointestinal: Negative.   Genitourinary: Negative.   Neurological: Negative.    All other systems reviewed and are negative.   PHYSICAL EXAM:   VS:  BP (!) 150/84   Pulse 71   Ht 6\' 3"  (1.905 m)  Wt 237 lb 3.2 oz (107.6 kg)   BMI 29.65 kg/m   Physical Exam  Affect appropriate Healthy:  appears stated age HEENT: normal Neck supple with no adenopathy JVP normal no bruits no thyromegaly Lungs clear with no wheezing and good diaphragmatic motion Heart:  S1/S2 no murmur, no rub, gallop or click PMI normal Abdomen: benighn, BS positve, no tenderness, no AAA no bruit.  No HSM or HJR Distal pulses intact with no bruits No edema Neuro non-focal Skin warm and dry No muscular weakness Post lumbar laminectomy   Wt Readings from Last 3 Encounters:  02/27/17 237 lb 3.2 oz (107.6 kg)  05/31/16 251 lb (113.9 kg)  06/24/15 261 lb (118.4 kg)      Studies/Labs Reviewed:   EKG:  EKG is ordered today.  The ekg ordered today demonstrates Normal sinus rhythm poor R wave progression anteriorly consistent with old infarct, no acute change  Recent Labs: No results found for requested labs within last 8760 hours.   Lipid Panel    Component Value Date/Time   CHOL 187 10/10/2012 1036   TRIG 173 (H) 10/10/2012 1036   HDL 27 (L) 10/10/2012 1036   CHOLHDL 6.9 10/10/2012 1036   VLDL 35 10/10/2012 1036   LDLCALC 125 (H) 10/10/2012 1036    Additional studies/ records that were reviewed today include:  Cardiac catheterization 09/26/14  IMPRESSIONS:   1. Normal left main coronary artery. 2. Patent stents in the proximal and  mid left anterior descending artery with mild in-stent restenosis. To note the lesion in the mid LAD just after a large diagonal branch. This was successfully stented with a 2.5 x 12 Synergy drug-eluting stent, postdilated to greater than 2.75 mm. 3. Mild disease in the left circumflex artery and its branches. 4. Focal area of restenosis in the mid right coronary artery prior stents.  This was treated with cutting balloon angioplasty with a 3.5 x 10 angina balloon with no residual stenosis. 5. Normal left ventricular systolic function.  LVEDP 7 mmHg.  Ejection fraction 65%.   RECOMMENDATION:  Continue dual antiplatelet therapy for at least a year. He needs aggressive secondary prevention including smoking cessation. We used Brilinta today. If there are issues with him affording the Brilinta long-term, I would load him with 300 mg of Plavix and then switch him to clopidogrel 75 mg daily.       Electronically signed by Corky CraftsJayadeep S Varanasi, MD at 09/26/2014  7:02 PM     ASSESSMENT:    No diagnosis found.   PLAN:  In order of problems listed above:  1) CAD:  Stable low risk myovue done 08/02/16 no ischemia EF 54% off DAT continue medical Rx 2. ) Cholesterol: on statin labs with primary 3. DM:  Discussed low carb diet.  Target hemoglobin A1c is 6.5 or less.  Continue current medications. 4) Back:  Chronic pain post epidural injection and previous laminectomy Clear to have spinal fusion with Dr Noel Geroldohen in near future

## 2017-02-27 ENCOUNTER — Ambulatory Visit (INDEPENDENT_AMBULATORY_CARE_PROVIDER_SITE_OTHER): Payer: Medicaid Other | Admitting: Cardiovascular Disease

## 2017-02-27 ENCOUNTER — Encounter: Payer: Self-pay | Admitting: Cardiovascular Disease

## 2017-02-27 VITALS — BP 150/84 | HR 71 | Ht 75.0 in | Wt 237.2 lb

## 2017-02-27 DIAGNOSIS — I2510 Atherosclerotic heart disease of native coronary artery without angina pectoris: Secondary | ICD-10-CM | POA: Diagnosis not present

## 2017-02-27 NOTE — Patient Instructions (Signed)
Medication Instructions:   Your physician recommends that you continue on your current medications as directed. Please refer to the Current Medication list given to you today.  Labwork:  NONE  Testing/Procedures:  NONE  Follow-Up:  Your physician recommends that you schedule a follow-up appointment in: 6 months. You will receive a reminder letter in the mail in about 4 months reminding you to call and schedule your appointment. If you don't receive this letter, please contact our office.  Any Other Special Instructions Will Be Listed Below (If Applicable).  If you need a refill on your cardiac medications before your next appointment, please call your pharmacy. s

## 2017-09-18 ENCOUNTER — Other Ambulatory Visit: Payer: Self-pay | Admitting: Orthopaedic Surgery

## 2017-09-18 DIAGNOSIS — M4326 Fusion of spine, lumbar region: Secondary | ICD-10-CM

## 2017-10-08 ENCOUNTER — Ambulatory Visit
Admission: RE | Admit: 2017-10-08 | Discharge: 2017-10-08 | Disposition: A | Discharge status: Home / Self Care | Payer: Medicaid Other | Source: Ambulatory Visit | Attending: Orthopaedic Surgery | Admitting: Orthopaedic Surgery

## 2017-10-08 DIAGNOSIS — F419 Anxiety disorder, unspecified: Secondary | ICD-10-CM | POA: Insufficient documentation

## 2017-10-08 DIAGNOSIS — F329 Major depressive disorder, single episode, unspecified: Secondary | ICD-10-CM | POA: Insufficient documentation

## 2017-10-08 DIAGNOSIS — F32A Depression, unspecified: Secondary | ICD-10-CM | POA: Insufficient documentation

## 2017-10-08 DIAGNOSIS — M4326 Fusion of spine, lumbar region: Secondary | ICD-10-CM

## 2017-10-08 DIAGNOSIS — M1990 Unspecified osteoarthritis, unspecified site: Secondary | ICD-10-CM | POA: Insufficient documentation

## 2017-10-08 MED ORDER — MEPERIDINE HCL 100 MG/ML IJ SOLN
75.0000 mg | Freq: Once | INTRAMUSCULAR | Status: AC
  Administered 2017-10-08: 75 mg via INTRAMUSCULAR

## 2017-10-08 MED ORDER — ONDANSETRON HCL 4 MG/2ML IJ SOLN
4.0000 mg | Freq: Once | INTRAMUSCULAR | Status: AC
  Administered 2017-10-08: 4 mg via INTRAMUSCULAR

## 2017-10-08 MED ORDER — DIAZEPAM 5 MG PO TABS
10.0000 mg | ORAL_TABLET | Freq: Once | ORAL | Status: AC
  Administered 2017-10-08: 10 mg via ORAL

## 2017-10-08 MED ORDER — IOPAMIDOL (ISOVUE-M 200) INJECTION 41%
15.0000 mL | Freq: Once | INTRAMUSCULAR | Status: AC
  Administered 2017-10-08: 15 mL via INTRATHECAL

## 2017-10-08 NOTE — Progress Notes (Signed)
Patient states he has been off Tramadol for at least the past two days. 

## 2017-10-08 NOTE — Discharge Instructions (Signed)
Myelogram Discharge Instructions  1. Go home and rest quietly for the next 24 hours.  It is important to lie flat for the next 24 hours.  Get up only to go to the restroom.  You may lie in the bed or on a couch on your back, your stomach, your left side or your right side.  You may have one pillow under your head.  You may have pillows between your knees while you are on your side or under your knees while you are on your back.  2. DO NOT drive today.  Recline the seat as far back as it will go, while still wearing your seat belt, on the way home.  3. You may get up to go to the bathroom as needed.  You may sit up for 10 minutes to eat.  You may resume your normal diet and medications unless otherwise indicated.  Drink lots of extra fluids today and tomorrow.  4. The incidence of headache, nausea, or vomiting is about 5% (one in 20 patients).  If you develop a headache, lie flat and drink plenty of fluids until the headache goes away.  Caffeinated beverages may be helpful.  If you develop severe nausea and vomiting or a headache that does not go away with flat bed rest, call [phone].  5. You may resume normal activities after your 24 hours of bed rest is over; however, do not exert yourself strongly or do any heavy lifting tomorrow. If when you get up you have a headache when standing, go back to bed and force fluids for another 24 hours.  6. Call your physician for a follow-up appointment.  The results of your myelogram will be sent directly to your physician by the following day.  7. If you have any questions or if complications develop after you arrive home, please call [phone].  Discharge instructions have been explained to the patient.  The patient, or the person responsible for the patient, fully understands these instructions.  YOU MAY RESTART YOUR TRAMADOL TOMORROW 10/09/2017 AT 1:00PM AS NEEDED.

## 2017-12-26 ENCOUNTER — Encounter: Payer: Self-pay | Admitting: Adult Health

## 2017-12-26 NOTE — Telephone Encounter (Signed)
Opened in error

## 2018-01-22 ENCOUNTER — Ambulatory Visit: Payer: Medicaid Other | Admitting: Adult Health

## 2018-01-22 NOTE — Progress Notes (Deleted)
Cardiology Office Note   Date:  01/22/2018   ID:  Clayton Foster, DOB 1960-10-27, MRN [id number]  PCP:  System, Provider Not In  Cardiologist:  Dr. Eden Emms No chief complaint on file.    History of Present Illness: Clayton Foster is a 57 y.o. male who presents for ongoing assessment and management of coronary artery disease, history of an STEMI treated with drug-eluting stent to the mid LAD and PTCA to the RCA in March 2016, other history to include hypertension, diabetes mellitus, chronic back pain, ongoing tobacco abuse and anxiety.  He is being followed by [facility] for ongoing management of back pain with L5-S1 foraminal injections, and was slated to have lumbar fusion when last seen by Dr. Eden Emms on 02/27/2017    Past Medical History:  Diagnosis Date  . Anxiety   . Arthritis   . Chronic back pain   . Chronic neck pain   . Coronary artery disease    a. 2000: s/p PCI/stenting of prox LAD and RCA @ [facility];  b. 09/2012 Cath/PCI: LM 30d, LAD 40p ISR, 60m, 76m/d (2.25x20 Promus Premier DES), LCX 30p, RCA 80p (3.0x24 Promus Premier DES), patent mid stent, 42m, PDA 57m (2.75x28 Promus Premier DES), EF 60-65%. c. cath 09/26/2014 EF 65%, patent LAD stents, DES 2.5 x 12 Synergy to mid LAD, 80% RCA stenosis treated with balloon angio  . Essential hypertension   . GERD (gastroesophageal reflux disease)   . Headache   . Hyperlipidemia   . Myocardial infarction (HCC)   . Neuromuscular disorder (HCC)   . Type 2 diabetes mellitus (HCC)     Past Surgical History:  Procedure Laterality Date  . APPENDECTOMY    . BACK SURGERY    . CARDIAC CATHETERIZATION    . CORONARY ANGIOPLASTY    . LEFT HEART CATHETERIZATION WITH CORONARY ANGIOGRAM N/A 10/11/2012   Procedure: LEFT HEART CATHETERIZATION WITH CORONARY ANGIOGRAM;  Surgeon: Laurey Morale, MD;  Location: [facility] CATH LAB;  Service: Cardiovascular;  Laterality: N/A;  . LEFT HEART CATHETERIZATION WITH CORONARY ANGIOGRAM N/A 09/26/2014   Procedure: LEFT HEART  CATHETERIZATION WITH CORONARY ANGIOGRAM;  Surgeon: Corky Crafts, MD;  Location: [facility] CATH LAB;  Service: Cardiovascular;  Laterality: N/A;  . MULTIPLE EXTRACTIONS WITH ALVEOLOPLASTY N/A 01/22/2015   Procedure: MULTIPLE EXTRACTIONS # 2, 4, 5, 6, 7, 10, 11, 12, 13, 15, 20, 21, 22, 23, 24, 25, 26, 27, 28, 31, and Alveloplasty;  Surgeon: Ocie Doyne, DDS;  Location: [facility] OR;  Service: Oral Surgery;  Laterality: N/A;  . PERCUTANEOUS CORONARY STENT INTERVENTION (PCI-S) N/A 10/14/2012   Procedure: PERCUTANEOUS CORONARY STENT INTERVENTION (PCI-S);  Surgeon: Tonny Bollman, MD;  Location: [facility] CATH LAB;  Service: Cardiovascular;  Laterality: N/A;  . PERCUTANEOUS CORONARY STENT INTERVENTION (PCI-S)  09/26/2014   Procedure: PERCUTANEOUS CORONARY STENT INTERVENTION (PCI-S);  Surgeon: Corky Crafts, MD;  Location: [facility] CATH LAB;  Service: Cardiovascular;;  . TONSILLECTOMY       Current Outpatient Medications  Medication Sig Dispense Refill  . ASPERCREME W/LIDOCAINE EX Apply 1 application topically daily as needed (pain).    Marland Kitchen aspirin EC 81 MG tablet Take 81 mg by mouth every morning.     Marland Kitchen atorvastatin (LIPITOR) 80 MG tablet Take 1 tablet (80 mg total) by mouth daily at 6 PM. 30 tablet 11  . cetirizine (ZYRTEC) 10 MG tablet Take 10 mg by mouth daily as needed for allergies.    Marland Kitchen gabapentin (NEURONTIN) 600 MG tablet Take 600 mg by mouth every  6 (six) hours.     Marland Kitchen lisinopril-hydrochlorothiazide (PRINZIDE,ZESTORETIC) 20-25 MG per tablet Take 1 tablet by mouth daily.    . metFORMIN (GLUCOPHAGE) 500 MG tablet Take 500 mg by mouth 2 (two) times daily with a meal.    . metoprolol tartrate (LOPRESSOR) 25 MG tablet Take 1 tablet (25 mg total) by mouth 2 (two) times daily. 60 tablet 11  . nitroGLYCERIN (NITROSTAT) 0.4 MG SL tablet Place 1 tablet (0.4 mg total) under the tongue every 5 (five) minutes as needed for chest pain. (Patient not taking: Reported on 09/20/2017) 25 tablet 1  . oxycodone (OXY-IR) 5 MG capsule  Take 10 mg by mouth every 6 (six) hours as needed.    . pantoprazole (PROTONIX) 40 MG tablet Take 40 mg by mouth daily as needed (acid reflux).     . polyethylene glycol-electrolytes (NULYTELY/GOLYTELY) 420 G solution Take 4,000 mLs by mouth once. 4000 mL 0  . ranitidine (ZANTAC) 150 MG tablet Take 150 mg by mouth 2 (two) times daily.    . tizanidine (ZANAFLEX) 2 MG capsule Take 2 mg by mouth 3 (three) times daily as needed for muscle spasms.    . traMADol (ULTRAM) 50 MG tablet Take 1 tab by mouth q6 hrs prn     No current facility-administered medications for this visit.     Allergies:   Patient has no known allergies.    Social History:  The patient  reports that he has been smoking cigarettes.  He started smoking about 40 years ago. He has been smoking about 0.50 packs per day. He has quit using smokeless tobacco. He reports that he does not drink alcohol or use drugs.   Family History:  The patient's family history includes Hypertension in his father.    ROS: All other systems are reviewed and negative. Unless otherwise mentioned in H&P    PHYSICAL EXAM: VS:  There were no vitals taken for this visit. , BMI There is no height or weight on file to calculate BMI. GEN: Well nourished, well developed, in no acute distress  HEENT: normal  Neck: no JVD, carotid bruits, or masses Cardiac: ***RRR; no murmurs, rubs, or gallops,no edema  Respiratory:  clear to auscultation bilaterally, normal work of breathing GI: soft, nontender, nondistended, + BS MS: no deformity or atrophy  Skin: warm and dry, no rash Neuro:  Strength and sensation are intact Psych: euthymic mood, full affect   EKG:  EKG {ACTION; IS/IS [id number]} ordered today. The ekg ordered today demonstrates ***   Recent Labs: No results found for requested labs within last 8760 hours.    Lipid Panel    Component Value Date/Time   CHOL 187 10/10/2012 1036   TRIG 173 (H) 10/10/2012 1036   HDL 27 (L) 10/10/2012 1036    CHOLHDL 6.9 10/10/2012 1036   VLDL 35 10/10/2012 1036   LDLCALC 125 (H) 10/10/2012 1036      Wt Readings from Last 3 Encounters:  02/27/17 237 lb 3.2 oz (107.6 kg)  05/31/16 251 lb (113.9 kg)  06/24/15 261 lb (118.4 kg)      Other studies Reviewed: Cardiac catheterization 09/26/14  IMPRESSIONS:  1. Normal left main coronary artery. 2. Patent stents in the proximal and mid left anterior descending artery with mild in-stent restenosis. To note the lesion in the mid LAD just after a large diagonal branch. This was successfully stented with a 2.5 x 12 Synergy drug-eluting stent, postdilated to greater than 2.75 mm. 3. Mild disease in the  left circumflex artery and its branches. 4. Focal area of restenosis in the mid right coronary artery prior stents. This was treated with cutting balloon angioplasty with a 3.5 x 10 angina balloon with no residual stenosis. 5. Normal left ventricular systolic function. LVEDP 7 mmHg. Ejection fraction 65%.  RECOMMENDATION:Continue dual antiplatelet therapy for at least a year.      ASSESSMENT AND PLAN:  1.  ***   Current medicines are reviewed at length with the patient today.    Labs/ tests ordered today include: *** Bettey MareKathryn M. Liborio NixonLawrence DNP, ANP, AACC   01/22/2018 7:09 AM    [facility] [address], [place], [place] [address] Phone: [phone]; Fax: [phone]

## 2018-02-24 NOTE — Progress Notes (Signed)
Cardiology Office Note   Date:  02/25/2018   ID:  Clayton Foster, DOB Aug 12, 1960, MRN [id number]  PCP:  System, Provider Not In  Cardiologist:  Dr. Wyline MoodBranch Chief Complaint  Patient presents with  . Pre-op Exam  . Coronary Artery Disease  . Hypertension     History of Present Illness: Clayton Foster is a 57 y.o. male who presents for ongoing assessment and management of coronary artery disease with history of NSTEMI treated with DES to the mid LAD, PTCA to the RCA, in March 2016.  Other history includes hypertension, type 2 diabetes mellitus, chronic back pain, ongoing tobacco abuse and anxiety.    The patient was last seen by Dr. Charlton HawsPeter Nishan on 02/27/2017.  She was found to be hypertensive, and was here for preoperative evaluation in order to have back surgery for lumbar fusion.  The patient was stable from a cardiac standpoint and released to have surgery due to stable Myoview done on 08/02/2016 which did not reveal ischemia.  The patient was off of dual antiplatelet therapy, and was to continue current medication regimen.  He is due to have repeat back surgery on disc's above prior surgical site by Dr. Noel Geroldohen on March 11, 2018. He requires clearance from cardiac standpoint.   He was late to the office today, because he got lost between the two offices. He denies recurrent chest pain, DOE, or fatigue. His main complaint is chronic back pain that does not allow him to be very active. He wants to get off of pian medications   Past Medical History:  Diagnosis Date  . Anxiety   . Arthritis   . Chronic back pain   . Chronic neck pain   . Coronary artery disease    a. 2000: s/p PCI/stenting of prox LAD and RCA @ [facility];  b. 09/2012 Cath/PCI: LM 30d, LAD 40p ISR, 147m, 4145m/d (2.25x20 Promus Premier DES), LCX 30p, RCA 80p (3.0x24 Promus Premier DES), patent mid stent, 467m, PDA 4436m (2.75x28 Promus Premier DES), EF 60-65%. c. cath 09/26/2014 EF 65%, patent LAD stents, DES 2.5 x 12 Synergy to mid LAD,  80% RCA stenosis treated with balloon angio  . Essential hypertension   . GERD (gastroesophageal reflux disease)   . Headache   . Hyperlipidemia   . Myocardial infarction (HCC)   . Neuromuscular disorder (HCC)   . Type 2 diabetes mellitus (HCC)     Past Surgical History:  Procedure Laterality Date  . APPENDECTOMY    . BACK SURGERY    . CARDIAC CATHETERIZATION    . CORONARY ANGIOPLASTY    . LEFT HEART CATHETERIZATION WITH CORONARY ANGIOGRAM N/A 10/11/2012   Procedure: LEFT HEART CATHETERIZATION WITH CORONARY ANGIOGRAM;  Surgeon: Laurey Moralealton S McLean, MD;  [facility];  Service: Cardiovascular;  Laterality: N/A;  . LEFT HEART CATHETERIZATION WITH CORONARY ANGIOGRAM N/A 09/26/2014   Procedure: LEFT HEART CATHETERIZATION WITH CORONARY ANGIOGRAM;  Surgeon: Corky CraftsJayadeep S Varanasi, MD;  [facility];  Service: Cardiovascular;  Laterality: N/A;  . MULTIPLE EXTRACTIONS WITH ALVEOLOPLASTY N/A 01/22/2015   Procedure: MULTIPLE EXTRACTIONS # 2, 4, 5, 6, 7, 10, 11, 12, 13, 15, 20, 21, 22, 23, 24, 25, 26, 27, 28, 31, and Alveloplasty;  Surgeon: Ocie DoyneScott Jensen, DDS;  Location: [facility] OR;  Service: Oral Surgery;  Laterality: N/A;  . PERCUTANEOUS CORONARY STENT INTERVENTION (PCI-S) N/A 10/14/2012   Procedure: PERCUTANEOUS CORONARY STENT INTERVENTION (PCI-S);  Surgeon: Tonny BollmanMichael Cooper, MD;  [facility];  Service: Cardiovascular;  Laterality: N/A;  . PERCUTANEOUS CORONARY STENT INTERVENTION (PCI-S)  09/26/2014   Procedure: PERCUTANEOUS CORONARY STENT INTERVENTION (PCI-S);  Surgeon: Corky Crafts, MD;  Location[facility] C[facility] LAB;  Service: Cardiovascular;;  . TONSILLECTOMY       Current Outpatient Medications  Medication Sig Dispense Refill  . ASPERCREME W/LIDOCAINE EX Apply 1 application topically daily as needed (pain).    Marland Kitchen aspirin EC 81 MG tablet Take 81 mg by mouth every morning.     Marland Kitchen atorvastatin (LIPITOR) 80 MG tablet Take 1 tablet (80 mg total) by mouth daily at 6 PM. 30 tablet 11  .  cetirizine (ZYRTEC) 10 MG tablet Take 10 mg by mouth daily as needed for allergies.    Marland Kitchen gabapentin (NEURONTIN) 600 MG tablet Take 600 mg by mouth every 6 (six) hours.     Marland Kitchen lisinopril-hydrochlorothiazide (PRINZIDE,ZESTORETIC) 20-25 MG per tablet Take 1 tablet by mouth daily.    . metFORMIN (GLUCOPHAGE) 500 MG tablet Take 500 mg by mouth 2 (two) times daily with a meal.    . metoprolol tartrate (LOPRESSOR) 25 MG tablet Take 1 tablet (25 mg total) by mouth 2 (two) times daily. 60 tablet 11  . nitroGLYCERIN (NITROSTAT) 0.4 MG SL tablet Place 1 tablet (0.4 mg total) under the tongue every 5 (five) minutes as needed for chest pain. 25 tablet 1  . oxycodone (OXY-IR) 5 MG capsule Take 10 mg by mouth every 6 (six) hours as needed.    . pantoprazole (PROTONIX) 40 MG tablet Take 40 mg by mouth daily as needed (acid reflux).     . polyethylene glycol-electrolytes (NULYTELY/GOLYTELY) 420 G solution Take 4,000 mLs by mouth once. 4000 mL 0  . ranitidine (ZANTAC) 150 MG tablet Take 150 mg by mouth 2 (two) times daily.    . tizanidine (ZANAFLEX) 2 MG capsule Take 2 mg by mouth 3 (three) times daily as needed for muscle spasms.    . traMADol (ULTRAM) 50 MG tablet Take 1 tab by mouth q6 hrs prn     No current facility-administered medications for this visit.     Allergies:   Patient has no known allergies.    Social History:  The patient  reports that he has been smoking cigarettes.  He started smoking about 40 years ago. He has been smoking about 0.50 packs per day. He has quit using smokeless tobacco. He reports that he does not drink alcohol or use drugs.   Family History:  The patient's family history includes Hypertension in his father.    ROS: All other systems are reviewed and negative. Unless otherwise mentioned in H&P    PHYSICAL EXAM: VS:  BP (!) 145/88   Pulse (!) 59   Ht 6\' 3"  (1.905 m)   Wt 235 lb 6.4 oz (106.8 kg)   BMI 29.42 kg/m  , BMI Body mass index is 29.42 kg/m. GEN: Well  nourished, well developed, in no acute distress  HEENT: normal  Neck: no JVD, carotid bruits, or masses Cardiac: RRR; no murmurs, rubs, or gallops,no edema  Respiratory: Clear to auscultation bilaterally, normal work of breathing GI: soft, nontender, nondistended, + BS MS: no deformity or atrophy  Skin: warm and dry, no rash Neuro:  Strength and sensation are intact Psych: euthymic mood, full affect   EKG: Sinus bradycardia rate of 59 bpm.   Recent Labs: No results found for requested labs within last 8760 hours.    Lipid Panel    Component Value Date/Time   CHOL 187  10/10/2012 1036   TRIG 173 (H) 10/10/2012 1036   HDL 27 (L) 10/10/2012 1036   CHOLHDL 6.9 10/10/2012 1036   VLDL 35 10/10/2012 1036   LDLCALC 125 (H) 10/10/2012 1036      Wt Readings from Last 3 Encounters:  02/25/18 235 lb 6.4 oz (106.8 kg)  02/27/17 237 lb 3.2 oz (107.6 kg)  05/31/16 251 lb (113.9 kg)    Other studies Reviewed:  Echocardiogram 11/12/2017 Left ventricle: The cavity size was normal. Wall thickness was normal. Systolic function was normal. The estimated ejection fraction was in the range of 60% to 65%. - Aortic valve: Valve area: 2.75cm^2(VTI). Valve area: 3.78cm^2 (Vmax).  ASSESSMENT AND PLAN:  1.  Pre-Operative Evaluation: He is to have back surgery on 03/11/2018 by  Dr. Noel Gerold.   Chart reviewed as part of pre-operative protocol coverage. Given past medical history and time since last visit, based on ACC/AHA guidelines, Clayton Foster would be at acceptable risk for the planned procedure without further cardiovascular testing.   2. CAD: He is asymptomatic. Slightly bradycardic today. Will not change dose of his BB at this time to prevent tachycardia during peri-operative course. He will need fasting lipids and LFT's on next office visit unless done by PCP.   3. Hypercholesterolemia: He will continue atorvastatin as directed. Follow up labs as above.   4. Hypertension: Slightly  elevated today. Recheck in the exam room 146/86. Will need to follow with medication adjustments to have optimal BP. No changes prior to surgery.   Current medicines are reviewed at length with the patient today.    Labs/ tests ordered today include: None  Bettey Mare. Liborio Nixon, ANP, AACC   02/25/2018 4:14 PM    [facility] [address] [place], [place] [address] Phone: [phone]; Fax: [phone]

## 2018-02-25 ENCOUNTER — Ambulatory Visit: Payer: Medicaid Other | Admitting: Adult Health

## 2018-02-25 ENCOUNTER — Encounter: Payer: Self-pay | Admitting: Adult Health

## 2018-02-25 VITALS — BP 145/88 | HR 59 | Ht 75.0 in | Wt 235.4 lb

## 2018-02-25 DIAGNOSIS — I2510 Atherosclerotic heart disease of native coronary artery without angina pectoris: Secondary | ICD-10-CM

## 2018-02-25 DIAGNOSIS — Z01810 Encounter for preprocedural cardiovascular examination: Secondary | ICD-10-CM | POA: Diagnosis not present

## 2018-02-25 DIAGNOSIS — E7800 Pure hypercholesterolemia, unspecified: Secondary | ICD-10-CM | POA: Diagnosis not present

## 2018-02-25 DIAGNOSIS — I10 Essential (primary) hypertension: Secondary | ICD-10-CM | POA: Diagnosis not present

## 2018-02-25 NOTE — Patient Instructions (Signed)
Medication Instructions:  NO CHANGES- Your physician recommends that you continue on your current medications as directed. Please refer to the Current Medication list given to you today.  If you need a refill on your cardiac medications before your next appointment, please call your pharmacy.  Special Instructions: CLEARED FOR BACK SURGERY Clayton SacramentoW/MAX Clayton Foster  Follow-Up: Your physician wants you to follow-up in: 6 MONTHS WITH Clayton Foster (NURSE PRACTIONIER), DNP,AACC IF PRIMARY CARDIOLOGIST IS UNAVAILABLE.   You should receive a reminder letter in the mail two months in advance. If you do not receive a letter, please call our office NOV 2019 to schedule the JAN 2020 follow-up appointment.   Thank you for choosing [facility]!!

## 2018-09-09 ENCOUNTER — Ambulatory Visit: Payer: Medicaid Other | Admitting: Cardiovascular Disease

## 2018-10-21 ENCOUNTER — Other Ambulatory Visit: Payer: Self-pay | Admitting: Orthopaedic Surgery

## 2018-10-21 ENCOUNTER — Other Ambulatory Visit (HOSPITAL_COMMUNITY): Payer: Self-pay | Admitting: Orthopaedic Surgery

## 2018-10-21 DIAGNOSIS — M4326 Fusion of spine, lumbar region: Secondary | ICD-10-CM

## 2018-11-18 ENCOUNTER — Ambulatory Visit (HOSPITAL_COMMUNITY): Payer: Medicaid Other

## 2018-11-18 ENCOUNTER — Encounter (HOSPITAL_COMMUNITY): Payer: Self-pay

## 2018-11-30 ENCOUNTER — Encounter (HOSPITAL_COMMUNITY): Payer: Self-pay | Admitting: Emergency Medicine

## 2018-11-30 ENCOUNTER — Other Ambulatory Visit: Payer: Self-pay

## 2018-11-30 ENCOUNTER — Emergency Department (HOSPITAL_COMMUNITY)
Admission: EM | Admit: 2018-11-30 | Discharge: 2018-11-30 | Disposition: A | Discharge status: Home / Self Care | Payer: Medicaid Other | Attending: Emergency Medicine | Admitting: Emergency Medicine

## 2018-11-30 DIAGNOSIS — Z79899 Other long term (current) drug therapy: Secondary | ICD-10-CM | POA: Insufficient documentation

## 2018-11-30 DIAGNOSIS — F1193 Opioid use, unspecified with withdrawal: Secondary | ICD-10-CM

## 2018-11-30 DIAGNOSIS — Z7982 Long term (current) use of aspirin: Secondary | ICD-10-CM | POA: Diagnosis not present

## 2018-11-30 DIAGNOSIS — F17210 Nicotine dependence, cigarettes, uncomplicated: Secondary | ICD-10-CM | POA: Insufficient documentation

## 2018-11-30 DIAGNOSIS — I2510 Atherosclerotic heart disease of native coronary artery without angina pectoris: Secondary | ICD-10-CM | POA: Insufficient documentation

## 2018-11-30 DIAGNOSIS — I10 Essential (primary) hypertension: Secondary | ICD-10-CM | POA: Insufficient documentation

## 2018-11-30 DIAGNOSIS — F1123 Opioid dependence with withdrawal: Secondary | ICD-10-CM | POA: Diagnosis not present

## 2018-11-30 DIAGNOSIS — E785 Hyperlipidemia, unspecified: Secondary | ICD-10-CM | POA: Diagnosis not present

## 2018-11-30 DIAGNOSIS — E119 Type 2 diabetes mellitus without complications: Secondary | ICD-10-CM | POA: Diagnosis not present

## 2018-11-30 HISTORY — DX: Postlaminectomy syndrome, not elsewhere classified: M96.1

## 2018-11-30 LAB — CBC WITH DIFFERENTIAL/PLATELET
Abs Immature Granulocytes: 0.03 10*3/uL (ref 0.00–0.07)
Basophils Absolute: 0.1 10*3/uL (ref 0.0–0.1)
Basophils Relative: 1 %
Eosinophils Absolute: 0.1 10*3/uL (ref 0.0–0.5)
Eosinophils Relative: 1 %
HCT: 42.2 % (ref 39.0–52.0)
Hemoglobin: 15.3 g/dL (ref 13.0–17.0)
Immature Granulocytes: 0 %
Lymphocytes Relative: 39 %
Lymphs Abs: 4 10*3/uL (ref 0.7–4.0)
MCH: 32.8 pg (ref 26.0–34.0)
MCHC: 36.3 g/dL — ABNORMAL HIGH (ref 30.0–36.0)
MCV: 90.6 fL (ref 80.0–100.0)
Monocytes Absolute: 0.8 10*3/uL (ref 0.1–1.0)
Monocytes Relative: 8 %
Neutro Abs: 5.3 10*3/uL (ref 1.7–7.7)
Neutrophils Relative %: 51 %
Platelets: 241 10*3/uL (ref 150–400)
RBC: 4.66 MIL/uL (ref 4.22–5.81)
RDW: 12.6 % (ref 11.5–15.5)
WBC: 10.3 10*3/uL (ref 4.0–10.5)
nRBC: 0 % (ref 0.0–0.2)

## 2018-11-30 LAB — URINALYSIS, ROUTINE W REFLEX MICROSCOPIC
Bacteria, UA: NONE SEEN
Bilirubin Urine: NEGATIVE
Glucose, UA: >=500 mg/dL — AB
Hgb urine dipstick: NEGATIVE
Ketones, ur: NEGATIVE mg/dL
Leukocytes,Ua: NEGATIVE
Nitrite: NEGATIVE
Protein, ur: NEGATIVE mg/dL
Specific Gravity, Urine: 1.003 — ABNORMAL LOW (ref 1.005–1.030)
pH: 5 (ref 5.0–8.0)

## 2018-11-30 LAB — RAPID URINE DRUG SCREEN, HOSP PERFORMED
Amphetamines: NOT DETECTED
Barbiturates: NOT DETECTED
Benzodiazepines: NOT DETECTED
Cocaine: NOT DETECTED
Opiates: NOT DETECTED
Tetrahydrocannabinol: NOT DETECTED

## 2018-11-30 LAB — COMPREHENSIVE METABOLIC PANEL
ALT: 31 U/L (ref 0–44)
AST: 20 U/L (ref 15–41)
Albumin: 4.6 g/dL (ref 3.5–5.0)
Alkaline Phosphatase: 98 U/L (ref 38–126)
Anion gap: 10 (ref 5–15)
BUN: 12 mg/dL (ref 6–20)
CO2: 23 mmol/L (ref 22–32)
Calcium: 9.5 mg/dL (ref 8.9–10.3)
Chloride: 104 mmol/L (ref 98–111)
Creatinine, Ser: 0.79 mg/dL (ref 0.61–1.24)
GFR calc Af Amer: >60 mL/min (ref >60)
GFR calc non Af Amer: >60 mL/min (ref >60)
Glucose, Bld: 139 mg/dL — ABNORMAL HIGH (ref 70–99)
Potassium: 3.8 mmol/L (ref 3.5–5.1)
Sodium: 137 mmol/L (ref 135–145)
Total Bilirubin: 0.3 mg/dL (ref 0.3–1.2)
Total Protein: 8.1 g/dL (ref 6.5–8.1)

## 2018-11-30 MED ORDER — SODIUM CHLORIDE 0.9 % IV BOLUS
1000.0000 mL | Freq: Once | INTRAVENOUS | Status: AC
  Administered 2018-11-30: 1000 mL via INTRAVENOUS

## 2018-11-30 MED ORDER — SODIUM CHLORIDE 0.9 % IV BOLUS: 1000.0000 mL | Freq: Once | INTRAVENOUS | Status: DC

## 2018-11-30 MED ORDER — DICYCLOMINE HCL 20 MG PO TABS: 20.0000 mg | ORAL_TABLET | Freq: Four times a day (QID) | ORAL | 0 refills | Status: DC | PRN

## 2018-11-30 MED ORDER — HYDROXYZINE HCL 25 MG PO TABS: 25.0000 mg | ORAL_TABLET | Freq: Four times a day (QID) | ORAL | 0 refills | Status: DC

## 2018-11-30 MED ORDER — CLONIDINE HCL 0.1 MG PO TABS: 0.1000 mg | ORAL_TABLET | ORAL | Status: DC

## 2018-11-30 MED ORDER — NAPROXEN 500 MG PO TABS: 500.0000 mg | ORAL_TABLET | Freq: Two times a day (BID) | ORAL | 0 refills | Status: DC | PRN

## 2018-11-30 MED ORDER — NAPROXEN 250 MG PO TABS: 500.0000 mg | ORAL_TABLET | Freq: Two times a day (BID) | ORAL | Status: DC | PRN

## 2018-11-30 MED ORDER — CLONIDINE HCL 0.1 MG PO TABS
0.1000 mg | ORAL_TABLET | Freq: Four times a day (QID) | ORAL | Status: DC
  Administered 2018-11-30: 19:00:00 0.1 mg via ORAL
  Filled 2018-11-30: qty 1

## 2018-11-30 MED ORDER — LOPERAMIDE HCL 2 MG PO CAPS: 2.0000 mg | ORAL_CAPSULE | ORAL | Status: DC | PRN

## 2018-11-30 MED ORDER — CLONIDINE HCL 0.1 MG PO TABS: 0.1000 mg | ORAL_TABLET | Freq: Every day | ORAL | Status: DC

## 2018-11-30 MED ORDER — METHOCARBAMOL 500 MG PO TABS: 500.0000 mg | ORAL_TABLET | Freq: Three times a day (TID) | ORAL | 0 refills | Status: AC | PRN

## 2018-11-30 MED ORDER — HYDROXYZINE HCL 25 MG PO TABS
25.0000 mg | ORAL_TABLET | Freq: Four times a day (QID) | ORAL | Status: DC | PRN
  Administered 2018-11-30: 25 mg via ORAL
  Filled 2018-11-30: qty 1

## 2018-11-30 MED ORDER — METHOCARBAMOL 500 MG PO TABS: 500.0000 mg | ORAL_TABLET | Freq: Three times a day (TID) | ORAL | Status: DC | PRN

## 2018-11-30 MED ORDER — ONDANSETRON 4 MG PO TBDP: 4.0000 mg | ORAL_TABLET | Freq: Four times a day (QID) | ORAL | Status: DC | PRN

## 2018-11-30 MED ORDER — DICYCLOMINE HCL 10 MG PO CAPS
20.0000 mg | ORAL_CAPSULE | Freq: Four times a day (QID) | ORAL | Status: DC | PRN
  Administered 2018-11-30: 20 mg via ORAL
  Filled 2018-11-30: qty 2

## 2018-11-30 NOTE — ED Provider Notes (Signed)
[facility] EMERGENCY DEPARTMENT Provider Note   CSN: [id number] Arrival date & time: 11/30/18  1840    History   Chief Complaint Chief Complaint  Patient presents with  . Withdrawal    HPI Clayton Foster is a 58 y.o. male.     Pt presents to the ED today with opiate withdrawal sx.  Pt has been on opiates for about 2 years.  He was first started on them when he had his first back surgery.  The was weaned down to Tramadol, but then had to have another back surgery in August of 2019.  He was put back on oxy 15 mg and has weaned himself down to 10 mg every 5 hours.  He said he's been trying to wean off since the beginning of April, but is unable to do so.  He gets severe pain in his left leg and had a lot of abdominal cramping.  He has not been able to eat.     Past Medical History:  Diagnosis Date  . Anxiety   . Arthritis   . Chronic back pain   . Chronic back pain   . Chronic neck pain   . Coronary artery disease    a. 2000: s/p PCI/stenting of prox LAD and RCA @ [facility];  b. 09/2012 Cath/PCI: LM 30d, LAD 40p ISR, 83m, 56m/d (2.25x20 Promus Premier DES), LCX 30p, RCA 80p (3.0x24 Promus Premier DES), patent mid stent, 23m, PDA 55m (2.75x28 Promus Premier DES), EF 60-65%. c. cath 09/26/2014 EF 65%, patent LAD stents, DES 2.5 x 12 Synergy to mid LAD, 80% RCA stenosis treated with balloon angio  . Essential hypertension   . GERD (gastroesophageal reflux disease)   . Headache   . Hyperlipidemia   . Myocardial infarction (HCC)   . Neuromuscular disorder (HCC)   . Postlaminectomy syndrome   . Type 2 diabetes mellitus [facility])     Patient Active Problem List   Diagnosis Date Noted  . Anxiety 10/08/2017  . Arthritis 10/08/2017  . Depression 10/08/2017  . NSTEMI (non-ST elevated myocardial infarction) (HCC) 09/26/2014  . Hyponatremia 10/10/2012  . Chest pain 07/22/2012  . CAD (coronary artery disease) 07/22/2012  . Panic disorder 07/22/2012  . Tobacco use 07/22/2012  . GERD  (gastroesophageal reflux disease) 07/22/2012  . Diabetes mellitus, type 2 (HCC) 09/28/2011  . Hypertension 09/28/2011  . High cholesterol 09/28/2011  . Back pain with radiculopathy 09/28/2011    Past Surgical History:  Procedure Laterality Date  . APPENDECTOMY    . BACK SURGERY    . CARDIAC CATHETERIZATION    . CORONARY ANGIOPLASTY    . LEFT HEART CATHETERIZATION WITH CORONARY ANGIOGRAM N/A 10/11/2012   Procedure: LEFT HEART CATHETERIZATION WITH CORONARY ANGIOGRAM;  Surgeon: Laurey Morale, MD;  Location: [facility] CATH LAB;  Service: Cardiovascular;  Laterality: N/A;  . LEFT HEART CATHETERIZATION WITH CORONARY ANGIOGRAM N/A 09/26/2014   Procedure: LEFT HEART CATHETERIZATION WITH CORONARY ANGIOGRAM;  Surgeon: Corky Crafts, MD;  Location: [facility] CATH LAB;  Service: Cardiovascular;  Laterality: N/A;  . MULTIPLE EXTRACTIONS WITH ALVEOLOPLASTY N/A 01/22/2015   Procedure: MULTIPLE EXTRACTIONS # 2, 4, 5, 6, 7, 10, 11, 12, 13, 15, 20, 21, 22, 23, 24, 25, 26, 27, 28, 31, and Alveloplasty;  Surgeon: Ocie Doyne, DDS;  Location: [facility] OR;  Service: Oral Surgery;  Laterality: N/A;  . PERCUTANEOUS CORONARY STENT INTERVENTION (PCI-S) N/A 10/14/2012   Procedure: PERCUTANEOUS CORONARY STENT INTERVENTION (PCI-S);  Surgeon: Tonny Bollman, MD;  Location: [facility] CATH LAB;  Service: Cardiovascular;  Laterality: N/A;  . PERCUTANEOUS CORONARY STENT INTERVENTION (PCI-S)  09/26/2014   Procedure: PERCUTANEOUS CORONARY STENT INTERVENTION (PCI-S);  Surgeon: Corky Crafts, MD;  Location: [facility] CATH LAB;  Service: Cardiovascular;;  . TONSILLECTOMY          Home Medications    Prior to Admission medications   Medication Sig Start Date End Date Taking? Authorizing Provider  oxycodone (OXY-IR) 5 MG capsule Take 10 mg by mouth every 6 (six) hours as needed for pain.    Yes [provider]  ASPERCREME W/LIDOCAINE EX Apply 1 application topically daily as needed (pain).    [provider]  aspirin EC 81 MG  tablet Take 81 mg by mouth every morning.     [provider]  atorvastatin (LIPITOR) 80 MG tablet Take 1 tablet (80 mg total) by mouth daily at 6 PM. 09/27/14   Azalee Course, PA  cetirizine (ZYRTEC) 10 MG tablet Take 10 mg by mouth daily as needed for allergies.    [provider]  dicyclomine (BENTYL) 20 MG tablet Take 1 tablet (20 mg total) by mouth every 6 (six) hours as needed for spasms. 11/30/18   Jacalyn Lefevre, MD  gabapentin (NEURONTIN) 600 MG tablet Take 600 mg by mouth every 6 (six) hours.  11/13/13   Philip Aspen, Limmie Patricia, MD  hydrOXYzine (ATARAX/VISTARIL) 25 MG tablet Take 1 tablet (25 mg total) by mouth every 6 (six) hours. 11/30/18   Jacalyn Lefevre, MD  lisinopril-hydrochlorothiazide (PRINZIDE,ZESTORETIC) 20-25 MG per tablet Take 1 tablet by mouth daily.    [provider]  metFORMIN (GLUCOPHAGE) 500 MG tablet Take 500 mg by mouth 2 (two) times daily with a meal.    [provider]  methocarbamol (ROBAXIN) 500 MG tablet Take 1 tablet (500 mg total) by mouth every 8 (eight) hours as needed for muscle spasms. 11/30/18   Jacalyn Lefevre, MD  metoprolol tartrate (LOPRESSOR) 25 MG tablet Take 1 tablet (25 mg total) by mouth 2 (two) times daily. 09/27/14   Azalee Course, PA  naproxen (NAPROSYN) 500 MG tablet Take 1 tablet (500 mg total) by mouth 2 (two) times daily as needed for moderate pain. 11/30/18   Jacalyn Lefevre, MD  nitroGLYCERIN (NITROSTAT) 0.4 MG SL tablet Place 1 tablet (0.4 mg total) under the tongue every 5 (five) minutes as needed for chest pain. 01/29/14   Jodelle Gross, NP  pantoprazole (PROTONIX) 40 MG tablet Take 40 mg by mouth daily as needed (acid reflux).     [provider]  polyethylene glycol-electrolytes (NULYTELY/GOLYTELY) 420 G solution Take 4,000 mLs by mouth once. 01/19/15   Setzer, Brand Males, NP  ranitidine (ZANTAC) 150 MG tablet Take 150 mg by mouth 2 (two) times daily.    [provider]  tizanidine (ZANAFLEX) 2 MG  capsule Take 2 mg by mouth 3 (three) times daily as needed for muscle spasms.    [provider]  traMADol (ULTRAM) 50 MG tablet Take 50 mg by mouth every 6 (six) hours as needed for moderate pain or severe pain.  05/27/15   [provider]    Family History Family History  Problem Relation Age of Onset  . Hypertension Father     Social History Social History   Tobacco Use  . Smoking status: Current Every Day Smoker    Packs/day: 0.50    Types: Cigarettes    Start date: 07/24/1977  . Smokeless tobacco: Former Engineer, water Use Topics  . Alcohol  use: No    Alcohol/week: 0.0 standard drinks  . Drug use: No     Allergies   Patient has no known allergies.   Review of Systems Review of Systems  Constitutional: Positive for appetite change.  Gastrointestinal: Positive for abdominal pain and nausea.  All other systems reviewed and are negative.    Physical Exam Updated Vital Signs BP 98/64   Pulse (!) 50   Temp (!) 97.4 F (36.3 C) (Oral)   Resp 16   Ht  (1.905 m)   Wt 95.3 kg   SpO2 98%   BMI 26.25 kg/m   Physical Exam Vitals signs and nursing note reviewed.  Constitutional:      Appearance: Normal appearance.  HENT:     Head: Normocephalic and atraumatic.     Right Ear: External ear normal.     Left Ear: External ear normal.     Nose: Nose normal.     Mouth/Throat:     Mouth: Mucous membranes are moist.  Eyes:     Extraocular Movements: Extraocular movements intact.     Conjunctiva/sclera: Conjunctivae normal.     Pupils: Pupils are equal, round, and reactive to light.  Neck:     Musculoskeletal: Normal range of motion and neck supple.  Cardiovascular:     Rate and Rhythm: Normal rate and regular rhythm.     Pulses: Normal pulses.     Heart sounds: Normal heart sounds.  Pulmonary:     Effort: Pulmonary effort is normal.     Breath sounds: Normal breath sounds.  Abdominal:     General: Abdomen is flat. Bowel sounds are  normal.  Musculoskeletal: Normal range of motion.  Skin:    General: Skin is warm.     Capillary Refill: Capillary refill takes less than 2 seconds.  Neurological:     General: No focal deficit present.     Mental Status: He is alert and oriented to person, place, and time.  Psychiatric:        Mood and Affect: Mood normal.        Behavior: Behavior normal.      ED Treatments / Results  Labs (all labs ordered are listed, but only abnormal results are displayed) Labs Reviewed  COMPREHENSIVE METABOLIC PANEL - Abnormal; Notable for the following components:      Result Value   Glucose, Bld 139 (*)    All other components within normal limits  CBC WITH DIFFERENTIAL/PLATELET - Abnormal; Notable for the following components:   MCHC 36.3 (*)    All other components within normal limits  URINALYSIS, ROUTINE W REFLEX MICROSCOPIC - Abnormal; Notable for the following components:   Color, Urine STRAW (*)    Specific Gravity, Urine 1.003 (*)    Glucose, UA >=500 (*)    All other components within normal limits  RAPID URINE DRUG SCREEN, HOSP PERFORMED    EKG None  Radiology No results found.  Procedures Procedures (including critical care time)  Medications Ordered in ED Medications  dicyclomine (BENTYL) capsule 20 mg (has no administration in time range)  hydrOXYzine (ATARAX/VISTARIL) tablet 25 mg (has no administration in time range)  loperamide (IMODIUM) capsule 2-4 mg (has no administration in time range)  methocarbamol (ROBAXIN) tablet 500 mg (has no administration in time range)  naproxen (NAPROSYN) tablet 500 mg (has no administration in time range)  ondansetron (ZOFRAN-ODT) disintegrating tablet 4 mg (has no administration in time range)  cloNIDine (CATAPRES) tablet 0.1 mg (0.1 mg Oral  Not Given 11/30/18 2108)    Followed by  cloNIDine (CATAPRES) tablet 0.1 mg (has no administration in time range)    Followed by  cloNIDine (CATAPRES) tablet 0.1 mg (has no  administration in time range)  sodium chloride 0.9 % bolus 1,000 mL (has no administration in time range)  sodium chloride 0.9 % bolus 1,000 mL (0 mLs Intravenous Stopped 11/30/18 2029)  sodium chloride 0.9 % bolus 1,000 mL (1,000 mLs Intravenous New Bag/Given 11/30/18 2111)     Initial Impression / Assessment and Plan / ED Course  I have reviewed the triage vital signs and the nursing notes.  Pertinent labs & imaging results that were available during my care of the patient were reviewed by me and considered in my medical decision making (see chart for details).    Pt is feeling much better.  He will be d/c home with bentyl, atarax, robaxin, and naprosyn.  Return if worse.  Final Clinical Impressions(s) / ED Diagnoses   Final diagnoses:  Opiate withdrawal [facility](HCC)    ED Discharge Orders         Ordered    dicyclomine (BENTYL) 20 MG tablet  Every 6 hours PRN     11/30/18 2133    hydrOXYzine (ATARAX/VISTARIL) 25 MG tablet  Every 6 hours     11/30/18 2133    methocarbamol (ROBAXIN) 500 MG tablet  Every 8 hours PRN     11/30/18 2133    naproxen (NAPROSYN) 500 MG tablet  2 times daily PRN     11/30/18 2133           Jacalyn LefevreHaviland, Abeer Deskins, MD 11/30/18 2135

## 2018-11-30 NOTE — ED Triage Notes (Signed)
Pt c/o body cramping, especially in legs. Pt also c/o abdominal tightness to where he cannot eat. States symptoms began a month ago and had gotten worse. Pt had back surgery in August and has been trying to ween himself off of his hydrocodone. Pt was taken oxycodone 15 mg q 4hrs. Pt states he tapered dose to 10 mg and tried to increase the hours in between taking the medication. Pt took 10 mg at 1630 today.

## 2018-12-06 ENCOUNTER — Ambulatory Visit (HOSPITAL_COMMUNITY)
Admission: RE | Admit: 2018-12-06 | Discharge: 2018-12-06 | Disposition: A | Discharge status: Home / Self Care | Payer: Medicaid Other | Source: Ambulatory Visit | Attending: Orthopaedic Surgery | Admitting: Orthopaedic Surgery

## 2018-12-06 ENCOUNTER — Other Ambulatory Visit: Payer: Self-pay

## 2018-12-06 DIAGNOSIS — M4326 Fusion of spine, lumbar region: Secondary | ICD-10-CM | POA: Diagnosis not present

## 2018-12-14 ENCOUNTER — Encounter (HOSPITAL_COMMUNITY): Payer: Self-pay | Admitting: *Deleted

## 2018-12-14 ENCOUNTER — Other Ambulatory Visit: Payer: Self-pay

## 2018-12-14 DIAGNOSIS — I2510 Atherosclerotic heart disease of native coronary artery without angina pectoris: Secondary | ICD-10-CM | POA: Insufficient documentation

## 2018-12-14 DIAGNOSIS — Z7984 Long term (current) use of oral hypoglycemic drugs: Secondary | ICD-10-CM | POA: Insufficient documentation

## 2018-12-14 DIAGNOSIS — E119 Type 2 diabetes mellitus without complications: Secondary | ICD-10-CM | POA: Diagnosis not present

## 2018-12-14 DIAGNOSIS — F17210 Nicotine dependence, cigarettes, uncomplicated: Secondary | ICD-10-CM | POA: Insufficient documentation

## 2018-12-14 DIAGNOSIS — Z7982 Long term (current) use of aspirin: Secondary | ICD-10-CM | POA: Diagnosis not present

## 2018-12-14 DIAGNOSIS — R1084 Generalized abdominal pain: Secondary | ICD-10-CM | POA: Diagnosis present

## 2018-12-14 DIAGNOSIS — I252 Old myocardial infarction: Secondary | ICD-10-CM | POA: Diagnosis not present

## 2018-12-14 DIAGNOSIS — Z79899 Other long term (current) drug therapy: Secondary | ICD-10-CM | POA: Diagnosis not present

## 2018-12-14 DIAGNOSIS — I10 Essential (primary) hypertension: Secondary | ICD-10-CM | POA: Diagnosis not present

## 2018-12-14 NOTE — ED Triage Notes (Signed)
Pt states that he has been trying to come off opiates, c/o leg cramps, abd pain. Was recently seen in er for same symptoms,

## 2018-12-15 ENCOUNTER — Emergency Department (HOSPITAL_COMMUNITY)
Admission: EM | Admit: 2018-12-15 | Discharge: 2018-12-15 | Disposition: A | Discharge status: Home / Self Care | Payer: Medicaid Other | Attending: Emergency Medicine | Admitting: Emergency Medicine

## 2018-12-15 ENCOUNTER — Emergency Department (HOSPITAL_COMMUNITY): Payer: Medicaid Other

## 2018-12-15 DIAGNOSIS — R1084 Generalized abdominal pain: Secondary | ICD-10-CM

## 2018-12-15 LAB — CBC WITH DIFFERENTIAL/PLATELET
Abs Immature Granulocytes: 0.02 10*3/uL (ref 0.00–0.07)
Basophils Absolute: 0.1 10*3/uL (ref 0.0–0.1)
Basophils Relative: 1 %
Eosinophils Absolute: 0.2 10*3/uL (ref 0.0–0.5)
Eosinophils Relative: 2 %
HCT: 40.3 % (ref 39.0–52.0)
Hemoglobin: 14.2 g/dL (ref 13.0–17.0)
Immature Granulocytes: 0 %
Lymphocytes Relative: 46 %
Lymphs Abs: 4.1 10*3/uL — ABNORMAL HIGH (ref 0.7–4.0)
MCH: 32.8 pg (ref 26.0–34.0)
MCHC: 35.2 g/dL (ref 30.0–36.0)
MCV: 93.1 fL (ref 80.0–100.0)
Monocytes Absolute: 0.6 10*3/uL (ref 0.1–1.0)
Monocytes Relative: 7 %
Neutro Abs: 3.9 10*3/uL (ref 1.7–7.7)
Neutrophils Relative %: 44 %
Platelets: 196 10*3/uL (ref 150–400)
RBC: 4.33 MIL/uL (ref 4.22–5.81)
RDW: 13 % (ref 11.5–15.5)
WBC: 8.8 10*3/uL (ref 4.0–10.5)
nRBC: 0 % (ref 0.0–0.2)

## 2018-12-15 LAB — URINALYSIS, ROUTINE W REFLEX MICROSCOPIC
Bilirubin Urine: NEGATIVE
Glucose, UA: 50 mg/dL — AB
Hgb urine dipstick: NEGATIVE
Ketones, ur: NEGATIVE mg/dL
Leukocytes,Ua: NEGATIVE
Nitrite: NEGATIVE
Protein, ur: NEGATIVE mg/dL
Specific Gravity, Urine: 1.008 (ref 1.005–1.030)
pH: 6 (ref 5.0–8.0)

## 2018-12-15 LAB — LIPASE, BLOOD: Lipase: 28 U/L (ref 11–51)

## 2018-12-15 LAB — COMPREHENSIVE METABOLIC PANEL
ALT: 25 U/L (ref 0–44)
AST: 17 U/L (ref 15–41)
Albumin: 4 g/dL (ref 3.5–5.0)
Alkaline Phosphatase: 75 U/L (ref 38–126)
Anion gap: 10 (ref 5–15)
BUN: 10 mg/dL (ref 6–20)
CO2: 25 mmol/L (ref 22–32)
Calcium: 9.2 mg/dL (ref 8.9–10.3)
Chloride: 103 mmol/L (ref 98–111)
Creatinine, Ser: 0.87 mg/dL (ref 0.61–1.24)
GFR calc Af Amer: >60 mL/min (ref >60)
GFR calc non Af Amer: >60 mL/min (ref >60)
Glucose, Bld: 134 mg/dL — ABNORMAL HIGH (ref 70–99)
Potassium: 3.8 mmol/L (ref 3.5–5.1)
Sodium: 138 mmol/L (ref 135–145)
Total Bilirubin: 0.5 mg/dL (ref 0.3–1.2)
Total Protein: 7 g/dL (ref 6.5–8.1)

## 2018-12-15 LAB — RAPID URINE DRUG SCREEN, HOSP PERFORMED
Amphetamines: NOT DETECTED
Barbiturates: NOT DETECTED
Benzodiazepines: POSITIVE — AB
Cocaine: NOT DETECTED
Opiates: NOT DETECTED
Tetrahydrocannabinol: NOT DETECTED

## 2018-12-15 LAB — LACTIC ACID, PLASMA
Lactic Acid, Venous: 0.8 mmol/L (ref 0.5–1.9)
Lactic Acid, Venous: 0.9 mmol/L (ref 0.5–1.9)

## 2018-12-15 MED ORDER — IOHEXOL 350 MG/ML SOLN
100.0000 mL | Freq: Once | INTRAVENOUS | Status: AC | PRN
  Administered 2018-12-15: 05:00:00 100 mL via INTRAVENOUS

## 2018-12-15 MED ORDER — ONDANSETRON HCL 4 MG PO TABS: 4.0000 mg | ORAL_TABLET | Freq: Four times a day (QID) | ORAL | 0 refills | Status: DC | PRN

## 2018-12-15 MED ORDER — LIDOCAINE VISCOUS HCL 2 % MT SOLN
15.0000 mL | Freq: Once | OROMUCOSAL | Status: AC
  Administered 2018-12-15: 01:00:00 15 mL via ORAL
  Filled 2018-12-15: qty 15

## 2018-12-15 MED ORDER — ALUM & MAG HYDROXIDE-SIMETH 200-200-20 MG/5ML PO SUSP
30.0000 mL | Freq: Once | ORAL | Status: AC
  Administered 2018-12-15: 01:00:00 30 mL via ORAL
  Filled 2018-12-15: qty 30

## 2018-12-15 NOTE — ED Provider Notes (Signed)
[facility] EMERGENCY DEPARTMENT Provider Note   CSN: [id number] Arrival date & time: 12/14/18  2302    History   Chief Complaint Chief Complaint  Patient presents with  . Abdominal Pain    HPI Clayton Foster is a 58 y.o. male.   The history is provided by the patient.  Abdominal Pain  He has history of GERD, diabetes, hyperlipidemia, hypertension, coronary artery disease, anxiety, depression and comes in because his stomach is "bothering him".  He states that he will have discomfort in the periumbilical area after eating which can last anywhere from several hours to all day long.  There is no radiation of discomfort.  He currently rates it at 6/10, but can be as severe as 8/10.  There is no associated nausea, vomiting, diarrhea.  He denies fever, chills, sweats.  No foods seem to bother him more than other foods.  He denies constipation or diarrhea.  He denies melena or hematochezia.  He had been taking hydroxyzine which gave partial relief, but did not seem to be helping him today.  Past Medical History:  Diagnosis Date  . Anxiety   . Arthritis   . Chronic back pain   . Chronic back pain   . Chronic neck pain   . Coronary artery disease    a. 2000: s/p PCI/stenting of prox LAD and RCA @ [facility];  b. 09/2012 Cath/PCI: LM 30d, LAD 40p ISR, 32m, 5m/d (2.25x20 Promus Premier DES), LCX 30p, RCA 80p (3.0x24 Promus Premier DES), patent mid stent, 66m, PDA 63m (2.75x28 Promus Premier DES), EF 60-65%. c. cath 09/26/2014 EF 65%, patent LAD stents, DES 2.5 x 12 Synergy to mid LAD, 80% RCA stenosis treated with balloon angio  . Essential hypertension   . GERD (gastroesophageal reflux disease)   . Headache   . Hyperlipidemia   . Myocardial infarction (HCC)   . Neuromuscular disorder (HCC)   . Postlaminectomy syndrome   . Type 2 diabetes mellitus [facility])     Patient Active Problem List   Diagnosis Date Noted  . Anxiety 10/08/2017  . Arthritis 10/08/2017  . Depression 10/08/2017  .  NSTEMI (non-ST elevated myocardial infarction) (HCC) 09/26/2014  . Hyponatremia 10/10/2012  . Chest pain 07/22/2012  . CAD (coronary artery disease) 07/22/2012  . Panic disorder 07/22/2012  . Tobacco use 07/22/2012  . GERD (gastroesophageal reflux disease) 07/22/2012  . Diabetes mellitus, type 2 (HCC) 09/28/2011  . Hypertension 09/28/2011  . High cholesterol 09/28/2011  . Back pain with radiculopathy 09/28/2011    Past Surgical History:  Procedure Laterality Date  . APPENDECTOMY    . BACK SURGERY    . CARDIAC CATHETERIZATION    . CORONARY ANGIOPLASTY    . LEFT HEART CATHETERIZATION WITH CORONARY ANGIOGRAM N/A 10/11/2012   Procedure: LEFT HEART CATHETERIZATION WITH CORONARY ANGIOGRAM;  Surgeon: Laurey Morale, MD;  Location: [facility] CATH LAB;  Service: Cardiovascular;  Laterality: N/A;  . LEFT HEART CATHETERIZATION WITH CORONARY ANGIOGRAM N/A 09/26/2014   Procedure: LEFT HEART CATHETERIZATION WITH CORONARY ANGIOGRAM;  Surgeon: Corky Crafts, MD;  Location: [facility] CATH LAB;  Service: Cardiovascular;  Laterality: N/A;  . MULTIPLE EXTRACTIONS WITH ALVEOLOPLASTY N/A 01/22/2015   Procedure: MULTIPLE EXTRACTIONS # 2, 4, 5, 6, 7, 10, 11, 12, 13, 15, 20, 21, 22, 23, 24, 25, 26, 27, 28, 31, and Alveloplasty;  Surgeon: Ocie Doyne, DDS;  Location: [facility] OR;  Service: Oral Surgery;  Laterality: N/A;  . PERCUTANEOUS CORONARY STENT INTERVENTION (PCI-S) N/A 10/14/2012   Procedure: PERCUTANEOUS  CORONARY STENT INTERVENTION (PCI-S);  Surgeon: Tonny Bollman, MD;  Location: [facility] CATH LAB;  Service: Cardiovascular;  Laterality: N/A;  . PERCUTANEOUS CORONARY STENT INTERVENTION (PCI-S)  09/26/2014   Procedure: PERCUTANEOUS CORONARY STENT INTERVENTION (PCI-S);  Surgeon: Corky Crafts, MD;  Location: [facility] CATH LAB;  Service: Cardiovascular;;  . TONSILLECTOMY          Home Medications    Prior to Admission medications   Medication Sig Start Date End Date Taking? Authorizing Provider  ASPERCREME W/LIDOCAINE EX  Apply 1 application topically daily as needed (pain).    [provider]  aspirin EC 81 MG tablet Take 81 mg by mouth every morning.     [provider]  atorvastatin (LIPITOR) 80 MG tablet Take 1 tablet (80 mg total) by mouth daily at 6 PM. 09/27/14   Azalee Course, PA  cetirizine (ZYRTEC) 10 MG tablet Take 10 mg by mouth daily as needed for allergies.    [provider]  dicyclomine (BENTYL) 20 MG tablet Take 1 tablet (20 mg total) by mouth every 6 (six) hours as needed for spasms. 11/30/18   Jacalyn Lefevre, MD  gabapentin (NEURONTIN) 600 MG tablet Take 600 mg by mouth every 6 (six) hours.  11/13/13   Philip Aspen, Limmie Patricia, MD  hydrOXYzine (ATARAX/VISTARIL) 25 MG tablet Take 1 tablet (25 mg total) by mouth every 6 (six) hours. 11/30/18   Jacalyn Lefevre, MD  lisinopril-hydrochlorothiazide (PRINZIDE,ZESTORETIC) 20-25 MG per tablet Take 1 tablet by mouth daily.    [provider]  metFORMIN (GLUCOPHAGE) 500 MG tablet Take 500 mg by mouth 2 (two) times daily with a meal.    [provider]  methocarbamol (ROBAXIN) 500 MG tablet Take 1 tablet (500 mg total) by mouth every 8 (eight) hours as needed for muscle spasms. 11/30/18   Jacalyn Lefevre, MD  metoprolol tartrate (LOPRESSOR) 25 MG tablet Take 1 tablet (25 mg total) by mouth 2 (two) times daily. 09/27/14   Azalee Course, PA  naproxen (NAPROSYN) 500 MG tablet Take 1 tablet (500 mg total) by mouth 2 (two) times daily as needed for moderate pain. 11/30/18   Jacalyn Lefevre, MD  nitroGLYCERIN (NITROSTAT) 0.4 MG SL tablet Place 1 tablet (0.4 mg total) under the tongue every 5 (five) minutes as needed for chest pain. 01/29/14   Jodelle Gross, NP  oxycodone (OXY-IR) 5 MG capsule Take 10 mg by mouth every 6 (six) hours as needed for pain.     [provider]  pantoprazole (PROTONIX) 40 MG tablet Take 40 mg by mouth daily as needed (acid reflux).     [provider]  polyethylene glycol-electrolytes  (NULYTELY/GOLYTELY) 420 G solution Take 4,000 mLs by mouth once. 01/19/15   Setzer, Brand Males, NP  ranitidine (ZANTAC) 150 MG tablet Take 150 mg by mouth 2 (two) times daily.    [provider]  tizanidine (ZANAFLEX) 2 MG capsule Take 2 mg by mouth 3 (three) times daily as needed for muscle spasms.    [provider]  traMADol (ULTRAM) 50 MG tablet Take 50 mg by mouth every 6 (six) hours as needed for moderate pain or severe pain.  05/27/15   [provider]    Family History Family History  Problem Relation Age of Onset  . Hypertension Father     Social History Social History   Tobacco Use  . Smoking status: Current Every Day Smoker    Packs/day: 0.50    Types: Cigarettes    Start  date: 07/24/1977  . Smokeless tobacco: Former Engineer, water Use Topics  . Alcohol use: No    Alcohol/week: 0.0 standard drinks  . Drug use: No     Allergies   Patient has no known allergies.   Review of Systems Review of Systems  Gastrointestinal: Positive for abdominal pain.  All other systems reviewed and are negative.    Physical Exam Updated Vital Signs BP (!) 173/77 (BP Location: Left Arm)   Pulse (!) 54   Temp 97.8 F (36.6 C) (Oral)   Resp 17   Ht  (1.905 m)   Wt 98.9 kg   SpO2 98%   BMI 27.25 kg/m   Physical Exam Vitals signs and nursing note reviewed.    58 year old male, resting comfortably and in no acute distress. Vital signs are significant for elevated blood pressure and slightly low heart rate. Oxygen saturation is 98%, which is normal. Head is normocephalic and atraumatic. PERRLA, EOMI. Oropharynx is clear. Neck is nontender and supple without adenopathy or JVD. Back is nontender and there is no CVA tenderness. Lungs are clear without rales, wheezes, or rhonchi. Chest is nontender. Heart has regular rate and rhythm without murmur. Abdomen is soft, flat, nontender without masses or hepatosplenomegaly and peristalsis is hypoactive.  Extremities have no cyanosis or edema, full range of motion is present. Skin is warm and dry without rash. Neurologic: Mental status is normal, cranial nerves are intact, there are no motor or sensory deficits.  ED Treatments / Results  Labs (all labs ordered are listed, but only abnormal results are displayed) Labs Reviewed  COMPREHENSIVE METABOLIC PANEL - Abnormal; Notable for the following components:      Result Value   Glucose, Bld 134 (*)    All other components within normal limits  CBC WITH DIFFERENTIAL/PLATELET - Abnormal; Notable for the following components:   Lymphs Abs 4.1 (*)    All other components within normal limits  URINALYSIS, ROUTINE W REFLEX MICROSCOPIC - Abnormal; Notable for the following components:   Color, Urine STRAW (*)    Glucose, UA 50 (*)    All other components within normal limits  RAPID URINE DRUG SCREEN, HOSP PERFORMED - Abnormal; Notable for the following components:   Benzodiazepines POSITIVE (*)    All other components within normal limits  LIPASE, BLOOD  LACTIC ACID, PLASMA  LACTIC ACID, PLASMA    Radiology Ct Angio Abd/pel W And/or Wo Contrast  Result Date: 12/15/2018 CLINICAL DATA:  58 y/o  M; chronic mesenteric ischemia. EXAM: CTA ABDOMEN AND PELVIS wITHOUT AND WITH CONTRAST TECHNIQUE: Multidetector CT imaging of the abdomen and pelvis was performed using the standard protocol during bolus administration of intravenous contrast. Multiplanar reconstructed images and MIPs were obtained and reviewed to evaluate the vascular anatomy. CONTRAST:  OMNIPAQUE IOHEXOL 350 MG/ML SOLN COMPARISON:  06/06/2013 CT of abdomen and pelvis. 12/06/2018 CT lumbar spine. FINDINGS: VASCULAR Aorta: Normal caliber aorta without aneurysm, dissection, vasculitis or significant stenosis. Calcific atherosclerosis. Celiac: Patent without evidence of aneurysm, dissection, vasculitis or significant stenosis. SMA: Patent without evidence of aneurysm, dissection,  vasculitis or significant stenosis. Right hepatic artery arises from the SMA. Renals: Both renal arteries are patent without evidence of aneurysm, dissection, vasculitis, fibromuscular dysplasia or significant stenosis. IMA: Patent without evidence of aneurysm, dissection, vasculitis or significant stenosis. Inflow: Patent without evidence of aneurysm, dissection, vasculitis or significant stenosis. Proximal Outflow: Bilateral common femoral and visualized portions of the superficial and profunda femoral arteries are patent without  evidence of aneurysm, dissection, vasculitis or significant stenosis. Veins: No obvious venous abnormality within the limitations of this arterial phase study. Review of the MIP images confirms the above findings. NON-VASCULAR Lower chest: No acute abnormality. Hepatobiliary: No focal liver abnormality is seen. No gallstones, gallbladder wall thickening, or biliary dilatation. Pancreas: Unremarkable. No pancreatic ductal dilatation or surrounding inflammatory changes. Spleen: Normal in size without focal abnormality. Adrenals/Urinary Tract: Adrenal glands are unremarkable. Kidneys are normal, without renal calculi, focal lesion, or hydronephrosis. Bladder is unremarkable. Stomach/Bowel: Stomach is within normal limits. Appendectomy. No evidence of bowel wall thickening, distention, or inflammatory changes. Lymphatic: Aortic atherosclerosis. No enlarged abdominal or pelvic lymph nodes. Reproductive: Prostate enlargement extending into floor of bladder. Other: No abdominal wall hernia or abnormality. No abdominopelvic ascites. Musculoskeletal: No fracture is seen. L3-S1 posterior instrumented fusion, left-sided L4-5 lateral fusion, and L5-S1 anterior fusion. L3-S1 interbody fusion prostheses. Hardware is intact. No periprosthetic fracture. There is lucency surrounding the L3 pedicle screws compatible with loosening. L3-4 laminectomy postsurgical changes noted. Findings related to lumbar  fusion are stable in comparison with prior lumbar spine CT. IMPRESSION: 1. Patent mesenteric arterial system without large vessel occlusion, aneurysm, or high-grade stenosis. 2.  Aortic Atherosclerosis (ICD10-I70.0). 3. No acute finding as explanation for pain. 4. Prostate enlargement. Electronically Signed   By: Mitzi HansenLance  Furusawa-Stratton M.D.   On: 12/15/2018 05:26    Procedures Procedures   Medications Ordered in ED Medications  alum & mag hydroxide-simeth (MAALOX/MYLANTA) 200-200-20 MG/5ML suspension 30 mL (30 mLs Oral Given 12/15/18 0044)    And  lidocaine (XYLOCAINE) 2 % viscous mouth solution 15 mL (15 mLs Oral Given 12/15/18 0044)  iohexol (OMNIPAQUE) 350 MG/ML injection 100 mL (100 mLs Intravenous Contrast Given 12/15/18 0439)     Initial Impression / Assessment and Plan / ED Course  I have reviewed the triage vital signs and the nursing notes.  Pertinent labs & imaging results that were available during my care of the patient were reviewed by me and considered in my medical decision making (see chart for details).  Postprandial pain of uncertain cause.  Old records are reviewed, and he does have a prior ED visit for abdominal pain in 2014, but that was associated with melena, and a CT was significant only for fatty liver.  With known coronary disease and pain occurring postprandially, I am concerned about possibility of mesenteric angina.  Symptoms could be simple gastritis.  Will check screening labs including lactic acid.  Lab work is unremarkable.  He will be sent for CT angiogram of the abdomen and pelvis to rule out vascular disease as a cause of his pain.  CT angiogram is unremarkable.  Patient is advised of these findings.  He is advised to increase his pantoprazole to twice a day, given prescription for ondansetron, and referred to gastroenterology for outpatient follow-up.  Return precautions discussed.  Final Clinical Impressions(s) / ED Diagnoses   Final diagnoses:   Generalized abdominal pain    ED Discharge Orders         Ordered    ondansetron (ZOFRAN) 4 MG tablet  Every 6 hours PRN     12/15/18 0542           Dione BoozeGlick, Vaneza Pickart, MD 12/15/18 [phone]

## 2018-12-15 NOTE — Discharge Instructions (Addendum)
Your evaluation today did not show what is causing your pain, but did not show any evidence of anything serious.  Please increase your pantoprazole (Protonix) to twice a day.  Make an appointment with the gastroenterologist.  Return if symptoms are worsening.

## 2018-12-17 ENCOUNTER — Encounter (HOSPITAL_COMMUNITY): Payer: Self-pay | Admitting: Clinical

## 2018-12-17 ENCOUNTER — Ambulatory Visit (HOSPITAL_COMMUNITY)
Admission: RE | Admit: 2018-12-17 | Discharge: 2018-12-17 | Disposition: A | Discharge status: Home / Self Care | Payer: Medicaid Other | Attending: Psychiatry | Admitting: Psychiatry

## 2018-12-17 DIAGNOSIS — I10 Essential (primary) hypertension: Secondary | ICD-10-CM | POA: Diagnosis not present

## 2018-12-17 DIAGNOSIS — E119 Type 2 diabetes mellitus without complications: Secondary | ICD-10-CM | POA: Insufficient documentation

## 2018-12-17 DIAGNOSIS — I252 Old myocardial infarction: Secondary | ICD-10-CM | POA: Insufficient documentation

## 2018-12-17 DIAGNOSIS — F419 Anxiety disorder, unspecified: Secondary | ICD-10-CM | POA: Insufficient documentation

## 2018-12-17 DIAGNOSIS — K219 Gastro-esophageal reflux disease without esophagitis: Secondary | ICD-10-CM | POA: Insufficient documentation

## 2018-12-17 DIAGNOSIS — E785 Hyperlipidemia, unspecified: Secondary | ICD-10-CM | POA: Insufficient documentation

## 2018-12-17 DIAGNOSIS — F1120 Opioid dependence, uncomplicated: Secondary | ICD-10-CM | POA: Insufficient documentation

## 2018-12-17 DIAGNOSIS — Z8249 Family history of ischemic heart disease and other diseases of the circulatory system: Secondary | ICD-10-CM | POA: Insufficient documentation

## 2018-12-17 DIAGNOSIS — M961 Postlaminectomy syndrome, not elsewhere classified: Secondary | ICD-10-CM | POA: Insufficient documentation

## 2018-12-17 DIAGNOSIS — F17210 Nicotine dependence, cigarettes, uncomplicated: Secondary | ICD-10-CM | POA: Diagnosis not present

## 2018-12-17 DIAGNOSIS — M549 Dorsalgia, unspecified: Secondary | ICD-10-CM | POA: Diagnosis not present

## 2018-12-17 DIAGNOSIS — G8929 Other chronic pain: Secondary | ICD-10-CM | POA: Insufficient documentation

## 2018-12-17 NOTE — H&P (Signed)
Behavioral Health Medical Screening Exam  Clayton Foster is an 58 y.o. male patient presents to [facility] as walk in with complaints or wanting to detox off of opiates.  Patient states that he has been taking pain medication for back pain after surgery for over a year and now he wants to get off of them.  Sates that he is looking to detox.  Patient states that his doctor has been doing a slow detox but wants to do it faster inpatient.  Patient denies prior psychiatric history other than anxiety years ago.  Patient denies suicidal/self-harm/homicidal ideation, psychosis, and paranoia.    Total Time spent with patient: 30 minutes  Psychiatric Specialty Exam: Physical Exam  Vitals reviewed. Constitutional: He is oriented to person, place, and time. He appears well-developed and well-nourished. No distress.  Neck: Normal range of motion. Neck supple.  Musculoskeletal: Normal range of motion.  Neurological: He is alert and oriented to person, place, and time.  Skin: Skin is warm and dry.  Psychiatric: He has a normal mood and affect. His speech is normal and behavior is normal. Judgment and thought content normal. Cognition and memory are normal.    Review of Systems  Psychiatric/Behavioral: Depression: Denies. Hallucinations: Denies. Memory loss: Denies. Substance abuse: prescribed pain medication that patient wants to detox off of  Suicidal ideas: Denies. Nervous/anxious: Denies. Insomnia: Denies.   All other systems reviewed and are negative.   Blood pressure (!) 163/80, pulse 67, temperature 98.1 F (36.7 C), temperature source Oral, resp. rate 18, SpO2 100 %.There is no height or weight on file to calculate BMI.  General Appearance: Casual  Eye Contact:  Good  Speech:  Clear and Coherent and Normal Rate  Volume:  Normal  Mood:  Appropriate  Affect:  Appropriate and Congruent  Thought Process:  Coherent and Goal Directed  Orientation:  Full (Time, Place, and Person)  Thought Content:   WDL  Suicidal Thoughts:  No  Homicidal Thoughts:  No  Memory:  Immediate;   Good Recent;   Good  Judgement:  Intact  Insight:  Present  Psychomotor Activity:  Normal  Concentration: Concentration: Good  Recall:  Good  Fund of Knowledge:Good  Language: Good  Akathisia:  No  Handed:  Right  AIMS (if indicated):     Assets:  Communication Skills Desire for Improvement Housing Social Support  Sleep:       Musculoskeletal: Strength & Muscle Tone: within normal limits Gait & Station: normal Patient leans: N/A  Blood pressure (!) 163/80, pulse 67, temperature 98.1 F (36.7 C), temperature source Oral, resp. rate 18, SpO2 100 %.  Recommendations:  Outpatient substance use services.  Given referral/resources for substance use detox/rehab facilities  Based on my evaluation the patient does not appear to have an emergency medical condition.   Disposition: No evidence of imminent risk to self or others at present.   Patient does not meet criteria for psychiatric inpatient admission. Supportive therapy provided about ongoing stressors. Discussed crisis plan, support from social network, calling 911, coming to the Emergency Department, and calling Suicide Hotline.  Shuvon Rankin, NP 12/17/2018, 6:00 PM

## 2018-12-17 NOTE — BH Assessment (Signed)
Assessment Note  Clayton Foster is an 58 y.o. male presenting voluntarily to [facility] for assessment requesting detox. Patient states that he has had 2 back surgeries and has chronic back pain. He states he is prescribed Oxycodone by his orthopedic surgery but feels dependent on it and that it is "working against him." Patient reports taking 10 mg every 5 hours. He denies and other substance use. Patient denies SI/HI/AVH. Patient states that he lives with his fiance who is supportive of him. Patient was provided with numerous resources for detox and outpatient treatment.   Diagnosis: Opioid dependence  Past Medical History:  Past Medical History:  Diagnosis Date  . Anxiety   . Arthritis   . Chronic back pain   . Chronic back pain   . Chronic neck pain   . Coronary artery disease    a. 2000: s/p PCI/stenting of prox LAD and RCA @ [facility];  b. 09/2012 Cath/PCI: LM 30d, LAD 40p ISR, 22m, 35m/d (2.25x20 Promus Premier DES), LCX 30p, RCA 80p (3.0x24 Promus Premier DES), patent mid stent, 20m, PDA 108m (2.75x28 Promus Premier DES), EF 60-65%. c. cath 09/26/2014 EF 65%, patent LAD stents, DES 2.5 x 12 Synergy to mid LAD, 80% RCA stenosis treated with balloon angio  . Essential hypertension   . GERD (gastroesophageal reflux disease)   . Headache   . Hyperlipidemia   . Myocardial infarction (HCC)   . Neuromuscular disorder (HCC)   . Postlaminectomy syndrome   . Type 2 diabetes mellitus (HCC)     Past Surgical History:  Procedure Laterality Date  . APPENDECTOMY    . BACK SURGERY    . CARDIAC CATHETERIZATION    . CORONARY ANGIOPLASTY    . LEFT HEART CATHETERIZATION WITH CORONARY ANGIOGRAM N/A 10/11/2012   Procedure: LEFT HEART CATHETERIZATION WITH CORONARY ANGIOGRAM;  Surgeon: Laurey Morale, MD;  Location: [facility] CATH LAB;  Service: Cardiovascular;  Laterality: N/A;  . LEFT HEART CATHETERIZATION WITH CORONARY ANGIOGRAM N/A 09/26/2014   Procedure: LEFT HEART CATHETERIZATION WITH CORONARY ANGIOGRAM;  Surgeon:  Corky Crafts, MD;  Location: [facility] CATH LAB;  Service: Cardiovascular;  Laterality: N/A;  . MULTIPLE EXTRACTIONS WITH ALVEOLOPLASTY N/A 01/22/2015   Procedure: MULTIPLE EXTRACTIONS # 2, 4, 5, 6, 7, 10, 11, 12, 13, 15, 20, 21, 22, 23, 24, 25, 26, 27, 28, 31, and Alveloplasty;  Surgeon: Ocie Doyne, DDS;  Location: [facility] OR;  Service: Oral Surgery;  Laterality: N/A;  . PERCUTANEOUS CORONARY STENT INTERVENTION (PCI-S) N/A 10/14/2012   Procedure: PERCUTANEOUS CORONARY STENT INTERVENTION (PCI-S);  Surgeon: Tonny Bollman, MD;  Location: [facility] CATH LAB;  Service: Cardiovascular;  Laterality: N/A;  . PERCUTANEOUS CORONARY STENT INTERVENTION (PCI-S)  09/26/2014   Procedure: PERCUTANEOUS CORONARY STENT INTERVENTION (PCI-S);  Surgeon: Corky Crafts, MD;  Location: [facility] CATH LAB;  Service: Cardiovascular;;  . TONSILLECTOMY      Family History:  Family History  Problem Relation Age of Onset  . Hypertension Father     Social History:  reports that he has been smoking cigarettes. He started smoking about 41 years ago. He has been smoking about 0.50 packs per day. He has quit using smokeless tobacco. He reports that he does not drink alcohol or use drugs.  Additional Social History:  Alcohol / Drug Use Pain Medications: see MAR Prescriptions: see MAR Over the Counter: see MAR History of alcohol / drug use?: No history of alcohol / drug abuse  CIWA: CIWA-Ar BP: (!) 163/80 Pulse Rate: 67 COWS:    Allergies:  No Known Allergies  Home Medications: (Not in a hospital admission)   OB/GYN Status:  No LMP for male patient.  General Assessment Data [facility] Assessment: In system Is this a Tele or Face-to-Face Assessment?: Face-to-Face Is this an Initial Assessment or a Re-assessment for this encounter?: Initial Assessment Patient Accompanied by:: N/A Language Other than English: No Living Arrangements: Other (Comment)(his home) What gender do you identify  as?: Male Marital status: Long term relationship Maiden name: Sine Pregnancy Status: No Living Arrangements: Spouse/significant other Can pt return to current living arrangement?: Yes Admission Status: Voluntary Is patient capable of signing voluntary admission?: Yes Referral Source: Self/Family/Friend Insurance type: Medicaid     Crisis Care Plan Living Arrangements: Spouse/significant other Legal Guardian: (self) Name of Psychiatrist: none Name of Therapist: none  Education Status Is patient currently in school?: No Is the patient employed, unemployed or receiving disability?: Receiving disability income  Risk to self with the past 6 months Suicidal Ideation: No Has patient been a risk to self within the past 6 months prior to admission? : No Suicidal Intent: No Has patient had any suicidal intent within the past 6 months prior to admission? : No Is patient at risk for suicide?: No Suicidal Plan?: No Has patient had any suicidal plan within the past 6 months prior to admission? : No Access to Means: No What has been your use of drugs/alcohol within the last 12 months?: prescripton oxycodone and ativan Previous Attempts/Gestures: No How many times?: 0 Other Self Harm Risks: none Triggers for Past Attempts: None known Intentional Self Injurious Behavior: None Family Suicide History: No Recent stressful life event(s): Recent negative physical changes Persecutory voices/beliefs?: No Depression: No Depression Symptoms: (none) Substance abuse history and/or treatment for substance abuse?: No Suicide prevention information given to non-admitted patients: Not applicable  Risk to Others within the past 6 months Homicidal Ideation: No Does patient have any lifetime risk of violence toward others beyond the six months prior to admission? : No Thoughts of Harm to Others: No Current Homicidal Intent: No Current Homicidal Plan: No Access to Homicidal Means: No Identified  Victim: none History of harm to others?: No Assessment of Violence: None Noted Violent Behavior Description: none noted Does patient have access to weapons?: No Criminal Charges Pending?: No Does patient have a court date: No Is patient on probation?: No  Psychosis Hallucinations: None noted Delusions: None noted  Mental Status Report Appearance/Hygiene: Unremarkable Eye Contact: Good Motor Activity: Freedom of movement Speech: Logical/coherent Level of Consciousness: Alert Mood: Anxious Affect: Anxious Anxiety Level: Moderate Thought Processes: Coherent, Relevant Judgement: Unimpaired Orientation: Person, Place, Time, Situation Obsessive Compulsive Thoughts/Behaviors: None  Cognitive Functioning Concentration: Normal Memory: Recent Intact, Remote Intact Is patient IDD: No Insight: Good Impulse Control: Good Appetite: Good Have you had any weight changes? : No Change Sleep: No Change Total Hours of Sleep: 8 Vegetative Symptoms: None  ADLScreening [facility] Assessment Services) Patient's cognitive ability adequate to safely complete daily activities?: Yes Patient able to express need for assistance with ADLs?: Yes Independently performs ADLs?: Yes (appropriate for developmental age)  Prior Inpatient Therapy Prior Inpatient Therapy: No  Prior Outpatient Therapy Prior Outpatient Therapy: No Does patient have an ACCT team?: No Does patient have Intensive In-House Services?  : No Does patient have [facility] services? : No Does patient have P4CC services?: No  ADL Screening (condition at time of admission) Patient's cognitive ability adequate to safely complete daily activities?: Yes Is the patient deaf or  have difficulty hearing?: No Does the patient have difficulty seeing, even when wearing glasses/contacts?: No Does the patient have difficulty concentrating, remembering, or making decisions?: No Patient able to express need for assistance with ADLs?: Yes Does the  patient have difficulty dressing or bathing?: No Independently performs ADLs?: Yes (appropriate for developmental age) Does the patient have difficulty walking or climbing stairs?: No Weakness of Legs: None Weakness of Arms/Hands: None  Home Assistive Devices/Equipment Home Assistive Devices/Equipment: None  Therapy Consults (therapy consults require a physician order) PT Evaluation Needed: No OT Evalulation Needed: No SLP Evaluation Needed: No Abuse/Neglect Assessment (Assessment to be complete while patient is alone) Abuse/Neglect Assessment Can Be Completed: Yes Physical Abuse: Denies Verbal Abuse: Denies Sexual Abuse: Denies Exploitation of patient/patient's resources: Denies Self-Neglect: Denies Values / Beliefs Cultural Requests During Hospitalization: None Spiritual Requests During Hospitalization: None Consults Spiritual Care Consult Needed: No Social Work Consult Needed: No Merchant navy officer (For Healthcare) Does Patient Have a Medical Advance Directive?: No Would patient like information on creating a medical advance directive?: No - Patient declined          Disposition: Per Shuvon Rankin, NP patient does not meet in patient criteria. Patient provided with resources for detox and outpatient facilities. Disposition Initial Assessment Completed for this Encounter: Yes Disposition of Patient: Discharge Patient refused recommended treatment: No Mode of transportation if patient is discharged/movement?: Car Patient referred to: [facility], [facility], Outpatient clinic referral  On Site Evaluation by:   Reviewed with Physician:    Celedonio Miyamoto 12/17/2018 6:09 PM

## 2019-01-01 ENCOUNTER — Emergency Department (HOSPITAL_COMMUNITY)
Admission: EM | Admit: 2019-01-01 | Discharge: 2019-01-02 | Disposition: A | Discharge status: Home / Self Care | Payer: Medicaid Other | Attending: Emergency Medicine | Admitting: Emergency Medicine

## 2019-01-01 ENCOUNTER — Encounter (HOSPITAL_COMMUNITY): Payer: Self-pay

## 2019-01-01 ENCOUNTER — Other Ambulatory Visit: Payer: Self-pay

## 2019-01-01 DIAGNOSIS — I2510 Atherosclerotic heart disease of native coronary artery without angina pectoris: Secondary | ICD-10-CM | POA: Diagnosis not present

## 2019-01-01 DIAGNOSIS — I252 Old myocardial infarction: Secondary | ICD-10-CM | POA: Insufficient documentation

## 2019-01-01 DIAGNOSIS — Z7984 Long term (current) use of oral hypoglycemic drugs: Secondary | ICD-10-CM | POA: Diagnosis not present

## 2019-01-01 DIAGNOSIS — E119 Type 2 diabetes mellitus without complications: Secondary | ICD-10-CM | POA: Diagnosis not present

## 2019-01-01 DIAGNOSIS — Z79899 Other long term (current) drug therapy: Secondary | ICD-10-CM | POA: Diagnosis not present

## 2019-01-01 DIAGNOSIS — I10 Essential (primary) hypertension: Secondary | ICD-10-CM | POA: Insufficient documentation

## 2019-01-01 DIAGNOSIS — R1084 Generalized abdominal pain: Secondary | ICD-10-CM | POA: Insufficient documentation

## 2019-01-01 DIAGNOSIS — Z7982 Long term (current) use of aspirin: Secondary | ICD-10-CM | POA: Diagnosis not present

## 2019-01-01 DIAGNOSIS — F17210 Nicotine dependence, cigarettes, uncomplicated: Secondary | ICD-10-CM | POA: Insufficient documentation

## 2019-01-01 DIAGNOSIS — R109 Unspecified abdominal pain: Secondary | ICD-10-CM | POA: Diagnosis present

## 2019-01-01 LAB — CBC WITH DIFFERENTIAL/PLATELET
Abs Immature Granulocytes: 0.01 10*3/uL (ref 0.00–0.07)
Basophils Absolute: 0 10*3/uL (ref 0.0–0.1)
Basophils Relative: 1 %
Eosinophils Absolute: 0.1 10*3/uL (ref 0.0–0.5)
Eosinophils Relative: 1 %
HCT: 41.2 % (ref 39.0–52.0)
Hemoglobin: 15.1 g/dL (ref 13.0–17.0)
Immature Granulocytes: 0 %
Lymphocytes Relative: 46 %
Lymphs Abs: 3.9 10*3/uL (ref 0.7–4.0)
MCH: 32.8 pg (ref 26.0–34.0)
MCHC: 36.7 g/dL — ABNORMAL HIGH (ref 30.0–36.0)
MCV: 89.4 fL (ref 80.0–100.0)
Monocytes Absolute: 0.6 10*3/uL (ref 0.1–1.0)
Monocytes Relative: 7 %
Neutro Abs: 3.9 10*3/uL (ref 1.7–7.7)
Neutrophils Relative %: 45 %
Platelets: 202 10*3/uL (ref 150–400)
RBC: 4.61 MIL/uL (ref 4.22–5.81)
RDW: 12.5 % (ref 11.5–15.5)
WBC: 8.7 10*3/uL (ref 4.0–10.5)
nRBC: 0 % (ref 0.0–0.2)

## 2019-01-01 MED ORDER — DICYCLOMINE HCL 10 MG PO CAPS
10.0000 mg | ORAL_CAPSULE | Freq: Once | ORAL | Status: AC
  Administered 2019-01-02: 10 mg via ORAL
  Filled 2019-01-01: qty 1

## 2019-01-01 NOTE — ED Triage Notes (Signed)
Pt states he has been trying to come off opiates, c/o leg cramps, abd pain. Pt has not took opiates in 7 days.

## 2019-01-01 NOTE — ED Provider Notes (Signed)
[facility] EMERGENCY DEPARTMENT Provider Note   CSN: [id number] Arrival date & time: 01/01/19  2157    History   Chief Complaint Chief Complaint  Patient presents with  . drug withdrawal    HPI Clayton LevyRichard W Foster is a 58 y.o. male.     HPI  This is a 58 year old male with a history of chronic back pain on narcotic pain medication, coronary artery disease, hypertension, hyperlipidemia, diabetes who presents with abdominal pain and leg pain.  Patient reports that he tried to come off his pain medication last week.  He did not take narcotic pain medication for approximately 7 days.  During that time, he had progressive pain in the lower extremities that was not relieved with ibuprofen.  He restarted his medications last night for pain.  Additionally he reports that he has had persistent and ongoing abdominal discomfort.  He describes it as "a vacuum in my stomach."  He is unable to tell me whether it is exacerbated or alleviated by food.  He states that he was given some medication during his last visit which "seemed to help for a day or 2."  He denies any vomiting but does endorse nausea.  Reports constipation.  Denies any recent fevers, chest pain, shortness of breath.  Patient had 2 visits in May. One was for abdominal pain for which she had a CTA which was negative.  He was discharged with Zofran.  He was also seen for possible opiate withdrawal syndrome.  Past Medical History:  Diagnosis Date  . Anxiety   . Arthritis   . Chronic back pain   . Chronic back pain   . Chronic neck pain   . Coronary artery disease    a. 2000: s/p PCI/stenting of prox LAD and RCA @ [facility];  b. 09/2012 Cath/PCI: LM 30d, LAD 40p ISR, 2528m, 3379m/d (2.25x20 Promus Premier DES), LCX 30p, RCA 80p (3.0x24 Promus Premier DES), patent mid stent, 5928m, PDA 6928m (2.75x28 Promus Premier DES), EF 60-65%. c. cath 09/26/2014 EF 65%, patent LAD stents, DES 2.5 x 12 Synergy to mid LAD, 80% RCA stenosis treated with balloon angio   . Essential hypertension   . GERD (gastroesophageal reflux disease)   . Headache   . Hyperlipidemia   . Myocardial infarction (HCC)   . Neuromuscular disorder (HCC)   . Postlaminectomy syndrome   . Type 2 diabetes mellitus [facility](HCC)     Patient Active Problem List   Diagnosis Date Noted  . Anxiety 10/08/2017  . Arthritis 10/08/2017  . Depression 10/08/2017  . NSTEMI (non-ST elevated myocardial infarction) (HCC) 09/26/2014  . Hyponatremia 10/10/2012  . Chest pain 07/22/2012  . CAD (coronary artery disease) 07/22/2012  . Panic disorder 07/22/2012  . Tobacco use 07/22/2012  . GERD (gastroesophageal reflux disease) 07/22/2012  . Diabetes mellitus, type 2 (HCC) 09/28/2011  . Hypertension 09/28/2011  . High cholesterol 09/28/2011  . Back pain with radiculopathy 09/28/2011    Past Surgical History:  Procedure Laterality Date  . APPENDECTOMY    . BACK SURGERY    . CARDIAC CATHETERIZATION    . CORONARY ANGIOPLASTY    . LEFT HEART CATHETERIZATION WITH CORONARY ANGIOGRAM N/A 10/11/2012   Procedure: LEFT HEART CATHETERIZATION WITH CORONARY ANGIOGRAM;  Surgeon: Laurey Moralealton S McLean, MD;  [facility];  Service: Cardiovascular;  Laterality: N/A;  . LEFT HEART CATHETERIZATION WITH CORONARY ANGIOGRAM N/A 09/26/2014   Procedure: LEFT HEART CATHETERIZATION WITH CORONARY ANGIOGRAM;  Surgeon: Corky CraftsJayadeep S Varanasi, MD;  [facility];  Service: Cardiovascular;  Laterality: N/A;  . MULTIPLE EXTRACTIONS WITH ALVEOLOPLASTY N/A 01/22/2015   Procedure: MULTIPLE EXTRACTIONS # 2, 4, 5, 6, 7, 10, 11, 12, 13, 15, 20, 21, 22, 23, 24, 25, 26, 27, 28, 31, and Alveloplasty;  Surgeon: Diona Browner, DDS;  Location: [place];  Service: Oral Surgery;  Laterality: N/A;  . PERCUTANEOUS CORONARY STENT INTERVENTION (PCI-S) N/A 10/14/2012   Procedure: PERCUTANEOUS CORONARY STENT INTERVENTION (PCI-S);  Surgeon: Sherren Mocha, MD;  Location: [facility] CATH LAB;  Service: Cardiovascular;  Laterality: N/A;  . PERCUTANEOUS  CORONARY STENT INTERVENTION (PCI-S)  09/26/2014   Procedure: PERCUTANEOUS CORONARY STENT INTERVENTION (PCI-S);  Surgeon: Jettie Booze, MD;  Location: [facility] CATH LAB;  Service: Cardiovascular;;  . TONSILLECTOMY          Home Medications    Prior to Admission medications   Medication Sig Start Date End Date Taking? Authorizing Provider  ASPERCREME W/LIDOCAINE EX Apply 1 application topically daily as needed (pain).    [provider]  aspirin EC 81 MG tablet Take 81 mg by mouth every morning.     [provider]  atorvastatin (LIPITOR) 80 MG tablet Take 1 tablet (80 mg total) by mouth daily at 6 PM. 09/27/14   Almyra Deforest, PA  cetirizine (ZYRTEC) 10 MG tablet Take 10 mg by mouth daily as needed for allergies.    [provider]  dicyclomine (BENTYL) 20 MG tablet Take 1 tablet (20 mg total) by mouth every 6 (six) hours as needed for spasms. 11/30/18   Isla Pence, MD  dicyclomine (BENTYL) 20 MG tablet Take 1 tablet (20 mg total) by mouth 2 (two) times daily. 01/02/19   Gwenlyn Hottinger, Barbette Hair, MD  gabapentin (NEURONTIN) 600 MG tablet Take 600 mg by mouth every 6 (six) hours.  11/13/13   Isaac Bliss, Rayford Halsted, MD  hydrOXYzine (ATARAX/VISTARIL) 25 MG tablet Take 1 tablet (25 mg total) by mouth every 6 (six) hours. 11/30/18   Isla Pence, MD  lisinopril-hydrochlorothiazide (PRINZIDE,ZESTORETIC) 20-25 MG per tablet Take 1 tablet by mouth daily.    [provider]  metFORMIN (GLUCOPHAGE) 500 MG tablet Take 500 mg by mouth 2 (two) times daily with a meal.    [provider]  methocarbamol (ROBAXIN) 500 MG tablet Take 1 tablet (500 mg total) by mouth every 8 (eight) hours as needed for muscle spasms. 11/30/18   Isla Pence, MD  metoprolol tartrate (LOPRESSOR) 25 MG tablet Take 1 tablet (25 mg total) by mouth 2 (two) times daily. 09/27/14   Almyra Deforest, PA  nitroGLYCERIN (NITROSTAT) 0.4 MG SL tablet Place 1 tablet (0.4 mg total) under the tongue every 5 (five)  minutes as needed for chest pain. 01/29/14   Lendon Colonel, NP  ondansetron (ZOFRAN) 4 MG tablet Take 1 tablet (4 mg total) by mouth every 6 (six) hours as needed. 8/75/64   Delora Fuel, MD  oxycodone (OXY-IR) 5 MG capsule Take 10 mg by mouth every 6 (six) hours as needed for pain.     [provider]  pantoprazole (PROTONIX) 40 MG tablet Take 40 mg by mouth daily as needed (acid reflux).     [provider]  polyethylene glycol-electrolytes (NULYTELY/GOLYTELY) 420 G solution Take 4,000 mLs by mouth once. 01/19/15   Setzer, Rona Ravens, NP  ranitidine (ZANTAC) 150 MG tablet Take 150 mg by mouth 2 (two) times daily.    [provider]  tizanidine (ZANAFLEX) 2 MG capsule Take 2 mg by mouth 3 (three) times daily  as needed for muscle spasms.    [provider]  traMADol (ULTRAM) 50 MG tablet Take 50 mg by mouth every 6 (six) hours as needed for moderate pain or severe pain.  05/27/15   [provider]    Family History Family History  Problem Relation Age of Onset  . Hypertension Father     Social History Social History   Tobacco Use  . Smoking status: Current Every Day Smoker    Packs/day: 0.50    Types: Cigarettes    Start date: 07/24/1977  . Smokeless tobacco: Former Engineer, waterUser  Substance Use Topics  . Alcohol use: No    Alcohol/week: 0.0 standard drinks  . Drug use: No     Allergies   Patient has no known allergies.   Review of Systems Review of Systems  Constitutional: Negative for fever.  Respiratory: Negative for cough and shortness of breath.   Cardiovascular: Negative for chest pain.  Gastrointestinal: Positive for abdominal pain, constipation and nausea. Negative for diarrhea and vomiting.  Genitourinary: Negative for dysuria.  Musculoskeletal: Positive for back pain.  Neurological: Negative for weakness and numbness.  All other systems reviewed and are negative.    Physical Exam Updated Vital Signs BP 134/81   Pulse (!) 52    Temp 98.2 F (36.8 C) (Tympanic)   Resp 18   Ht 1.905 m (6\' 3" )   Wt 97.5 kg   SpO2 100%   BMI 26.87 kg/m   Physical Exam Vitals signs and nursing note reviewed.  Constitutional:      Appearance: He is well-developed.     Comments: Anxious appearing but nontoxic  HENT:     Head: Normocephalic and atraumatic.     Mouth/Throat:     Mouth: Mucous membranes are moist.  Eyes:     Pupils: Pupils are equal, round, and reactive to light.  Cardiovascular:     Rate and Rhythm: Normal rate and regular rhythm.     Heart sounds: Normal heart sounds. No murmur.  Pulmonary:     Effort: Pulmonary effort is normal. No respiratory distress.     Breath sounds: Normal breath sounds. No wheezing.  Abdominal:     General: Bowel sounds are normal.     Palpations: Abdomen is soft.     Tenderness: There is no abdominal tenderness. There is no rebound.     Hernia: No hernia is present.  Lymphadenopathy:     Cervical: No cervical adenopathy.  Skin:    General: Skin is warm and dry.  Neurological:     Mental Status: He is alert and oriented to person, place, and time.     Comments: 5 out of 5 strength bilateral lower extremities  Psychiatric:        Mood and Affect: Mood normal.      ED Treatments / Results  Labs (all labs ordered are listed, but only abnormal results are displayed) Labs Reviewed  CBC WITH DIFFERENTIAL/PLATELET - Abnormal; Notable for the following components:      Result Value   MCHC 36.7 (*)    All other components within normal limits  BASIC METABOLIC PANEL - Abnormal; Notable for the following components:   Sodium 131 (*)    Chloride 96 (*)    Glucose, Bld 133 (*)    All other components within normal limits  LIPASE, BLOOD - Abnormal; Notable for the following components:   Lipase 52 (*)    All other components within normal limits  URINALYSIS, ROUTINE W  REFLEX MICROSCOPIC - Abnormal; Notable for the following components:   Color, Urine STRAW (*)    Specific  Gravity, Urine 1.004 (*)    All other components within normal limits  HEPATIC FUNCTION PANEL - Abnormal; Notable for the following components:   AST 13 (*)    All other components within normal limits    EKG None  Radiology No results found.  Procedures Procedures (including critical care time)  Medications Ordered in ED Medications  dicyclomine (BENTYL) capsule 10 mg (10 mg Oral Given 01/02/19 0017)     Initial Impression / Assessment and Plan / ED Course  I have reviewed the triage vital signs and the nursing notes.  Pertinent labs & imaging results that were available during my care of the patient were reviewed by me and considered in my medical decision making (see chart for details).        Patient presents with abdominal pain.  Reports ongoing abdominal pain since his last presentation at the end of May.  During that time he is attempted to stop narcotic pain medication but restarted yesterday.  He does report constipation.  He is overall nontoxic-appearing vital signs are reassuring.  His abdominal exam is benign without signs of tenderness or peritonitis.  Basic lab work obtained.  No significant metabolic derangements, LFT abnormalities.  Lipase is marginally elevated but consistent with prior.  Patient was given Bentyl and reports that he had some relief.  He has previously been given Bentyl and Zofran with relief of his symptoms.  He recently had a CT scan that was largely unremarkable.  Given his benign exam, have low suspicion for acute emergent process including appendicitis, pancreatitis, cholecystitis, obstruction.  Patient is able to tolerate fluids without difficulty.  Recommend follow-up with GI as previously directed.  After history, exam, and medical workup I feel the patient has been appropriately medically screened and is safe for discharge home. Pertinent diagnoses were discussed with the patient. Patient was given return precautions.   Final Clinical  Impressions(s) / ED Diagnoses   Final diagnoses:  Generalized abdominal pain    ED Discharge Orders         Ordered    dicyclomine (BENTYL) 20 MG tablet  2 times daily     01/02/19 0048           Ceriah Kohler, Mayer Maskerourtney F, MD 01/02/19 0050

## 2019-01-02 ENCOUNTER — Emergency Department (HOSPITAL_COMMUNITY): Payer: Medicaid Other

## 2019-01-02 ENCOUNTER — Other Ambulatory Visit: Payer: Self-pay

## 2019-01-02 ENCOUNTER — Encounter (HOSPITAL_COMMUNITY): Payer: Self-pay

## 2019-01-02 ENCOUNTER — Emergency Department (HOSPITAL_COMMUNITY)
Admission: EM | Admit: 2019-01-02 | Discharge: 2019-01-02 | Disposition: A | Discharge status: Home / Self Care | Payer: Medicaid Other | Source: Home / Self Care | Attending: Emergency Medicine | Admitting: Emergency Medicine

## 2019-01-02 DIAGNOSIS — R0789 Other chest pain: Secondary | ICD-10-CM

## 2019-01-02 DIAGNOSIS — R1084 Generalized abdominal pain: Secondary | ICD-10-CM

## 2019-01-02 LAB — LIPASE, BLOOD
Lipase: 52 U/L — ABNORMAL HIGH (ref 11–51)
Lipase: 43 U/L (ref 11–51)

## 2019-01-02 LAB — BASIC METABOLIC PANEL
Anion gap: 12 (ref 5–15)
BUN: 11 mg/dL (ref 6–20)
CO2: 23 mmol/L (ref 22–32)
Calcium: 9.3 mg/dL (ref 8.9–10.3)
Chloride: 96 mmol/L — ABNORMAL LOW (ref 98–111)
Creatinine, Ser: 0.85 mg/dL (ref 0.61–1.24)
GFR calc Af Amer: >60 mL/min (ref >60)
GFR calc non Af Amer: >60 mL/min (ref >60)
Glucose, Bld: 133 mg/dL — ABNORMAL HIGH (ref 70–99)
Potassium: 4.1 mmol/L (ref 3.5–5.1)
Sodium: 131 mmol/L — ABNORMAL LOW (ref 135–145)

## 2019-01-02 LAB — CBC WITH DIFFERENTIAL/PLATELET
Abs Immature Granulocytes: 0.02 10*3/uL (ref 0.00–0.07)
Basophils Absolute: 0.1 10*3/uL (ref 0.0–0.1)
Basophils Relative: 1 %
Eosinophils Absolute: 0.1 10*3/uL (ref 0.0–0.5)
Eosinophils Relative: 1 %
HCT: 44.9 % (ref 39.0–52.0)
Hemoglobin: 16 g/dL (ref 13.0–17.0)
Immature Granulocytes: 0 %
Lymphocytes Relative: 36 %
Lymphs Abs: 2.8 10*3/uL (ref 0.7–4.0)
MCH: 32.1 pg (ref 26.0–34.0)
MCHC: 35.6 g/dL (ref 30.0–36.0)
MCV: 90.2 fL (ref 80.0–100.0)
Monocytes Absolute: 0.6 10*3/uL (ref 0.1–1.0)
Monocytes Relative: 8 %
Neutro Abs: 4.2 10*3/uL (ref 1.7–7.7)
Neutrophils Relative %: 54 %
Platelets: 200 10*3/uL (ref 150–400)
RBC: 4.98 MIL/uL (ref 4.22–5.81)
RDW: 12.5 % (ref 11.5–15.5)
WBC: 7.9 10*3/uL (ref 4.0–10.5)
nRBC: 0 % (ref 0.0–0.2)

## 2019-01-02 LAB — COMPREHENSIVE METABOLIC PANEL
ALT: 19 U/L (ref 0–44)
AST: 18 U/L (ref 15–41)
Albumin: 4.2 g/dL (ref 3.5–5.0)
Alkaline Phosphatase: 91 U/L (ref 38–126)
Anion gap: 9 (ref 5–15)
BUN: 8 mg/dL (ref 6–20)
CO2: 26 mmol/L (ref 22–32)
Calcium: 9.7 mg/dL (ref 8.9–10.3)
Chloride: 94 mmol/L — ABNORMAL LOW (ref 98–111)
Creatinine, Ser: 0.96 mg/dL (ref 0.61–1.24)
GFR calc Af Amer: >60 mL/min (ref >60)
GFR calc non Af Amer: >60 mL/min (ref >60)
Glucose, Bld: 143 mg/dL — ABNORMAL HIGH (ref 70–99)
Potassium: 5.5 mmol/L — ABNORMAL HIGH (ref 3.5–5.1)
Sodium: 129 mmol/L — ABNORMAL LOW (ref 135–145)
Total Bilirubin: 0.7 mg/dL (ref 0.3–1.2)
Total Protein: 7.3 g/dL (ref 6.5–8.1)

## 2019-01-02 LAB — URINALYSIS, ROUTINE W REFLEX MICROSCOPIC
Bilirubin Urine: NEGATIVE
Glucose, UA: NEGATIVE mg/dL
Hgb urine dipstick: NEGATIVE
Ketones, ur: NEGATIVE mg/dL
Leukocytes,Ua: NEGATIVE
Nitrite: NEGATIVE
Protein, ur: NEGATIVE mg/dL
Specific Gravity, Urine: 1.004 — ABNORMAL LOW (ref 1.005–1.030)
pH: 6 (ref 5.0–8.0)

## 2019-01-02 LAB — HEPATIC FUNCTION PANEL
ALT: 16 U/L (ref 0–44)
AST: 13 U/L — ABNORMAL LOW (ref 15–41)
Albumin: 4.2 g/dL (ref 3.5–5.0)
Alkaline Phosphatase: 86 U/L (ref 38–126)
Bilirubin, Direct: 0.1 mg/dL (ref 0.0–0.2)
Indirect Bilirubin: 0.5 mg/dL (ref 0.3–0.9)
Total Bilirubin: 0.6 mg/dL (ref 0.3–1.2)
Total Protein: 7.1 g/dL (ref 6.5–8.1)

## 2019-01-02 LAB — TROPONIN I: Troponin I: <0.03 ng/mL (ref <0.03)

## 2019-01-02 MED ORDER — DICYCLOMINE HCL 20 MG PO TABS: 20.0000 mg | ORAL_TABLET | Freq: Two times a day (BID) | ORAL | 0 refills | Status: DC

## 2019-01-02 MED ORDER — IOHEXOL 300 MG/ML  SOLN
100.0000 mL | Freq: Once | INTRAMUSCULAR | Status: AC | PRN
  Administered 2019-01-02: 100 mL via INTRAVENOUS

## 2019-01-02 NOTE — Discharge Instructions (Addendum)
You were seen today for abdominal pain.  The cause of your pain at this time is unclear.  However, your work-up remains reassuring.  Take Bentyl as needed for discomfort.  Follow-up with gastroenterology as previously recommended.

## 2019-01-02 NOTE — Discharge Instructions (Addendum)
See your Physician for recheck.   Continue current medications  °

## 2019-01-02 NOTE — ED Triage Notes (Signed)
Pt reports knots in stomach and a twinge in CP. Pt had back sx and is trying to stop pain meds.

## 2019-01-02 NOTE — ED Provider Notes (Signed)
[facility]   CSN: [id number] Arrival date & time: 01/02/19  1220     History   Chief Complaint Chief Complaint  Patient presents with  . Chest Pain  . Abdominal Pain    HPI Clayton Foster is a 58 y.o. male.     The history is provided by the patient. No language interpreter was used.  Abdominal Pain Pain location:  Generalized Pain quality: aching   Pain radiates to:  Does not radiate Pain severity:  Moderate Onset quality:  Gradual Timing:  Constant Progression:  Worsening Chronicity:  New Relieved by:  Nothing Worsened by:  Nothing   Past Medical History:  Diagnosis Date  . Anxiety   . Arthritis   . Chronic back pain   . Chronic back pain   . Chronic neck pain   . Coronary artery disease    a. 2000: s/p PCI/stenting of prox LAD and RCA @ [facility];  b. 09/2012 Cath/PCI: LM 30d, LAD 40p ISR, 5184m, 5562m/d (2.25x20 Promus Premier DES), LCX 30p, RCA 80p (3.0x24 Promus Premier DES), patent mid stent, 6684m, PDA 839m (2.75x28 Promus Premier DES), EF 60-65%. c. cath 09/26/2014 EF 65%, patent LAD stents, DES 2.5 x 12 Synergy to mid LAD, 80% RCA stenosis treated with balloon angio  . Essential hypertension   . GERD (gastroesophageal reflux disease)   . Headache   . Hyperlipidemia   . Myocardial infarction (HCC)   . Neuromuscular disorder (HCC)   . Postlaminectomy syndrome   . Type 2 diabetes mellitus [facility](HCC)     Patient Active Problem List   Diagnosis Date Noted  . Anxiety 10/08/2017  . Arthritis 10/08/2017  . Depression 10/08/2017  . NSTEMI (non-ST elevated myocardial infarction) (HCC) 09/26/2014  . Hyponatremia 10/10/2012  . Chest pain 07/22/2012  . CAD (coronary artery disease) 07/22/2012  . Panic disorder 07/22/2012  . Tobacco use 07/22/2012  . GERD (gastroesophageal reflux disease) 07/22/2012  . Diabetes mellitus, type 2 (HCC) 09/28/2011  . Hypertension 09/28/2011  . High cholesterol 09/28/2011  . Back pain  with radiculopathy 09/28/2011    Past Surgical History:  Procedure Laterality Date  . APPENDECTOMY    . BACK SURGERY    . CARDIAC CATHETERIZATION    . CORONARY ANGIOPLASTY    . LEFT HEART CATHETERIZATION WITH CORONARY ANGIOGRAM N/A 10/11/2012   Procedure: LEFT HEART CATHETERIZATION WITH CORONARY ANGIOGRAM;  Surgeon: Laurey Moralealton S McLean, MD;  [facility];  Service: Cardiovascular;  Laterality: N/A;  . LEFT HEART CATHETERIZATION WITH CORONARY ANGIOGRAM N/A 09/26/2014   Procedure: LEFT HEART CATHETERIZATION WITH CORONARY ANGIOGRAM;  Surgeon: Corky CraftsJayadeep S Varanasi, MD;  [facility];  Service: Cardiovascular;  Laterality: N/A;  . MULTIPLE EXTRACTIONS WITH ALVEOLOPLASTY N/A 01/22/2015   Procedure: MULTIPLE EXTRACTIONS # 2, 4, 5, 6, 7, 10, 11, 12, 13, 15, 20, 21, 22, 23, 24, 25, 26, 27, 28, 31, and Alveloplasty;  Surgeon: Ocie DoyneScott Jensen, DDS;  Location: [facility] OR;  Service: Oral Surgery;  Laterality: N/A;  . PERCUTANEOUS CORONARY STENT INTERVENTION (PCI-S) N/A 10/14/2012   Procedure: PERCUTANEOUS CORONARY STENT INTERVENTION (PCI-S);  Surgeon: Tonny BollmanMichael Cooper, MD;  [facility];  Service: Cardiovascular;  Laterality: N/A;  . PERCUTANEOUS CORONARY STENT INTERVENTION (PCI-S)  09/26/2014   Procedure: PERCUTANEOUS CORONARY STENT INTERVENTION (PCI-S);  Surgeon: Corky CraftsJayadeep S Varanasi, MD;  [facility];  Service: Cardiovascular;;  . TONSILLECTOMY          Home Medications    Prior  to Admission medications   Medication Sig Start Date End Date Taking? Authorizing Provider  ASPERCREME W/LIDOCAINE EX Apply 1 application topically daily as needed (pain).    [provider]  aspirin EC 81 MG tablet Take 81 mg by mouth every morning.     [provider]  atorvastatin (LIPITOR) 80 MG tablet Take 1 tablet (80 mg total) by mouth daily at 6 PM. 09/27/14   Almyra Deforest, PA  cetirizine (ZYRTEC) 10 MG tablet Take 10 mg by mouth daily as needed for allergies.    [provider]  dicyclomine (BENTYL) 20 MG tablet Take 1 tablet (20 mg total) by mouth every 6 (six) hours as needed for spasms. 11/30/18   Isla Pence, MD  dicyclomine (BENTYL) 20 MG tablet Take 1 tablet (20 mg total) by mouth 2 (two) times daily. 01/02/19   Horton, Barbette Hair, MD  gabapentin (NEURONTIN) 600 MG tablet Take 600 mg by mouth every 6 (six) hours.  11/13/13   Isaac Bliss, Rayford Halsted, MD  hydrOXYzine (ATARAX/VISTARIL) 25 MG tablet Take 1 tablet (25 mg total) by mouth every 6 (six) hours. 11/30/18   Isla Pence, MD  lisinopril-hydrochlorothiazide (PRINZIDE,ZESTORETIC) 20-25 MG per tablet Take 1 tablet by mouth daily.    [provider]  metFORMIN (GLUCOPHAGE) 500 MG tablet Take 500 mg by mouth 2 (two) times daily with a meal.    [provider]  methocarbamol (ROBAXIN) 500 MG tablet Take 1 tablet (500 mg total) by mouth every 8 (eight) hours as needed for muscle spasms. 11/30/18   Isla Pence, MD  metoprolol tartrate (LOPRESSOR) 25 MG tablet Take 1 tablet (25 mg total) by mouth 2 (two) times daily. 09/27/14   Almyra Deforest, PA  nitroGLYCERIN (NITROSTAT) 0.4 MG SL tablet Place 1 tablet (0.4 mg total) under the tongue every 5 (five) minutes as needed for chest pain. 01/29/14   Lendon Colonel, NP  ondansetron (ZOFRAN) 4 MG tablet Take 1 tablet (4 mg total) by mouth every 6 (six) hours as needed. 10/06/38   Delora Fuel, MD  oxycodone (OXY-IR) 5 MG capsule Take 10 mg by mouth every 6 (six) hours as needed for pain.     [provider]  pantoprazole (PROTONIX) 40 MG tablet Take 40 mg by mouth daily as needed (acid reflux).     [provider]  polyethylene glycol-electrolytes (NULYTELY/GOLYTELY) 420 G solution Take 4,000 mLs by mouth once. 01/19/15   Setzer, Rona Ravens, NP  ranitidine (ZANTAC) 150 MG tablet Take 150 mg by mouth 2 (two) times daily.    [provider]  tizanidine (ZANAFLEX) 2 MG capsule Take 2 mg by mouth 3 (three) times daily as needed for muscle  spasms.    [provider]  traMADol (ULTRAM) 50 MG tablet Take 50 mg by mouth every 6 (six) hours as needed for moderate pain or severe pain.  05/27/15   [provider]    Family History Family History  Problem Relation Age of Onset  . Hypertension Father     Social History Social History   Tobacco Use  . Smoking status: Current Every Day Smoker    Packs/day: 0.50    Types: Cigarettes    Start date: 07/24/1977  . Smokeless tobacco: Former Network engineer Use Topics  . Alcohol use: No    Alcohol/week: 0.0 standard drinks  . Drug use: No     Allergies   Patient has no known allergies.   Review of Systems Review  of Systems  Gastrointestinal: Positive for abdominal pain.  All other systems reviewed and are negative.    Physical Exam Updated Vital Signs BP (!) 145/76   Pulse (!) 56   Temp 98.1 F (36.7 C) (Oral)   Resp 14   SpO2 97%   Physical Exam Vitals signs and nursing note reviewed.  Constitutional:      Appearance: He is well-developed.  HENT:     Head: Normocephalic and atraumatic.  Eyes:     Conjunctiva/sclera: Conjunctivae normal.     Pupils: Pupils are equal, round, and reactive to light.  Neck:     Musculoskeletal: Neck supple.  Cardiovascular:     Rate and Rhythm: Normal rate and regular rhythm.     Heart sounds: Normal heart sounds. No murmur.  Pulmonary:     Effort: Pulmonary effort is normal. No respiratory distress.     Breath sounds: Normal breath sounds.  Abdominal:     Palpations: Abdomen is soft.     Tenderness: There is abdominal tenderness.  Musculoskeletal: Normal range of motion.  Skin:    General: Skin is warm and dry.  Neurological:     General: No focal deficit present.     Mental Status: He is alert.      ED Treatments / Results  Labs (all labs ordered are listed, but only abnormal results are displayed) Labs Reviewed  COMPREHENSIVE METABOLIC PANEL - Abnormal; Notable for the following components:       Result Value   Sodium 129 (*)    Potassium 5.5 (*)    Chloride 94 (*)    Glucose, Bld 143 (*)    All other components within normal limits  CBC WITH DIFFERENTIAL/PLATELET  LIPASE, BLOOD    EKG    Radiology No results found.  Procedures Procedures (including critical care time)  Medications Ordered in ED Medications - No data to display   Initial Impression / Assessment and Plan / ED Course  I have reviewed the triage vital signs and the nursing notes.  Pertinent labs & imaging results that were available during my care of the patient were reviewed by me and considered in my medical decision making (see chart for details).        MDM  Records reviewed.  Pt had a ct angio of abdomen on 5/24 which was normal.  Pt stopped his pain medication and was [facility].  There was concern that pt's symptoms were second to withdrawal and pain medication.  I will repeat ct of pt's abdomen.  Pt's care turned over to Dr. Rhunette CroftNanavati  With troponin and ct pending  Final Clinical Impressions(s) / ED Diagnoses   Final diagnoses:  Generalized abdominal pain    ED Discharge Orders    None    An After Visit Summary was printed and given to the patient.    Osie CheeksSofia,  K, PA-C 01/02/19 1604    Lorre NickAllen, Anthony, MD 01/07/19 [phone]

## 2019-01-02 NOTE — ED Notes (Signed)
Patient verbalizes understanding of discharge instructions. Opportunity for questioning and answers were provided. Armband removed by staff, pt discharged from ED.  

## 2019-01-02 NOTE — ED Triage Notes (Signed)
Pt feels overwhelmed, like something is not right and needs someone to fix it.

## 2019-01-02 NOTE — ED Notes (Signed)
Pt requested to speak to EDP prior to d/c.

## 2019-02-04 ENCOUNTER — Encounter: Payer: Self-pay | Admitting: Internal Medicine

## 2019-02-19 ENCOUNTER — Ambulatory Visit: Payer: Medicaid Other | Admitting: Nurse Practitioner

## 2019-02-20 ENCOUNTER — Other Ambulatory Visit: Payer: Self-pay

## 2019-02-20 ENCOUNTER — Encounter (HOSPITAL_COMMUNITY): Payer: Self-pay | Admitting: *Deleted

## 2019-02-20 DIAGNOSIS — Z7982 Long term (current) use of aspirin: Secondary | ICD-10-CM | POA: Diagnosis not present

## 2019-02-20 DIAGNOSIS — I10 Essential (primary) hypertension: Secondary | ICD-10-CM | POA: Diagnosis not present

## 2019-02-20 DIAGNOSIS — R1084 Generalized abdominal pain: Secondary | ICD-10-CM | POA: Diagnosis not present

## 2019-02-20 DIAGNOSIS — I252 Old myocardial infarction: Secondary | ICD-10-CM | POA: Diagnosis not present

## 2019-02-20 DIAGNOSIS — F13230 Sedative, hypnotic or anxiolytic dependence with withdrawal, uncomplicated: Secondary | ICD-10-CM | POA: Insufficient documentation

## 2019-02-20 DIAGNOSIS — I2510 Atherosclerotic heart disease of native coronary artery without angina pectoris: Secondary | ICD-10-CM | POA: Diagnosis not present

## 2019-02-20 DIAGNOSIS — F17210 Nicotine dependence, cigarettes, uncomplicated: Secondary | ICD-10-CM | POA: Insufficient documentation

## 2019-02-20 DIAGNOSIS — E785 Hyperlipidemia, unspecified: Secondary | ICD-10-CM | POA: Diagnosis not present

## 2019-02-20 DIAGNOSIS — Z951 Presence of aortocoronary bypass graft: Secondary | ICD-10-CM | POA: Diagnosis not present

## 2019-02-20 DIAGNOSIS — G8929 Other chronic pain: Secondary | ICD-10-CM | POA: Diagnosis not present

## 2019-02-20 DIAGNOSIS — Z79899 Other long term (current) drug therapy: Secondary | ICD-10-CM | POA: Insufficient documentation

## 2019-02-20 DIAGNOSIS — E119 Type 2 diabetes mellitus without complications: Secondary | ICD-10-CM | POA: Diagnosis not present

## 2019-02-20 DIAGNOSIS — F1123 Opioid dependence with withdrawal: Secondary | ICD-10-CM | POA: Insufficient documentation

## 2019-02-20 LAB — COMPREHENSIVE METABOLIC PANEL
ALT: 17 U/L (ref 0–44)
AST: 16 U/L (ref 15–41)
Albumin: 4.7 g/dL (ref 3.5–5.0)
Alkaline Phosphatase: 94 U/L (ref 38–126)
Anion gap: 8 (ref 5–15)
BUN: 7 mg/dL (ref 6–20)
CO2: 26 mmol/L (ref 22–32)
Calcium: 9.5 mg/dL (ref 8.9–10.3)
Chloride: 104 mmol/L (ref 98–111)
Creatinine, Ser: 0.88 mg/dL (ref 0.61–1.24)
GFR calc Af Amer: >60 mL/min (ref >60)
GFR calc non Af Amer: >60 mL/min (ref >60)
Glucose, Bld: 157 mg/dL — ABNORMAL HIGH (ref 70–99)
Potassium: 3.8 mmol/L (ref 3.5–5.1)
Sodium: 138 mmol/L (ref 135–145)
Total Bilirubin: 0.8 mg/dL (ref 0.3–1.2)
Total Protein: 8 g/dL (ref 6.5–8.1)

## 2019-02-20 LAB — CBC WITH DIFFERENTIAL/PLATELET
Abs Immature Granulocytes: 0.02 10*3/uL (ref 0.00–0.07)
Basophils Absolute: 0.1 10*3/uL (ref 0.0–0.1)
Basophils Relative: 1 %
Eosinophils Absolute: 0.2 10*3/uL (ref 0.0–0.5)
Eosinophils Relative: 2 %
HCT: 44.7 % (ref 39.0–52.0)
Hemoglobin: 15.8 g/dL (ref 13.0–17.0)
Immature Granulocytes: 0 %
Lymphocytes Relative: 40 %
Lymphs Abs: 3.7 10*3/uL (ref 0.7–4.0)
MCH: 32.5 pg (ref 26.0–34.0)
MCHC: 35.3 g/dL (ref 30.0–36.0)
MCV: 92 fL (ref 80.0–100.0)
Monocytes Absolute: 0.7 10*3/uL (ref 0.1–1.0)
Monocytes Relative: 8 %
Neutro Abs: 4.5 10*3/uL (ref 1.7–7.7)
Neutrophils Relative %: 49 %
Platelets: 208 10*3/uL (ref 150–400)
RBC: 4.86 MIL/uL (ref 4.22–5.81)
RDW: 13.2 % (ref 11.5–15.5)
WBC: 9.1 10*3/uL (ref 4.0–10.5)
nRBC: 0 % (ref 0.0–0.2)

## 2019-02-20 LAB — ETHANOL: Alcohol, Ethyl (B): <10 mg/dL (ref <10)

## 2019-02-20 NOTE — ED Triage Notes (Addendum)
Pt states that he has been taking oxycodone 10 mg and has been cutting back on his usage, changing it from every 4 hours to 6 hours, pt also stopped his ativan, last dose was last night, c/o bilateral leg pain, headache, stomach cramps, requesting help with detox, denies any SI of HI.

## 2019-02-21 ENCOUNTER — Emergency Department (HOSPITAL_COMMUNITY): Payer: Medicaid Other

## 2019-02-21 ENCOUNTER — Emergency Department (HOSPITAL_COMMUNITY)
Admission: EM | Admit: 2019-02-21 | Discharge: 2019-02-21 | Disposition: A | Discharge status: Home / Self Care | Payer: Medicaid Other | Attending: Emergency Medicine | Admitting: Emergency Medicine

## 2019-02-21 DIAGNOSIS — F1193 Opioid use, unspecified with withdrawal: Secondary | ICD-10-CM

## 2019-02-21 DIAGNOSIS — F1123 Opioid dependence with withdrawal: Secondary | ICD-10-CM

## 2019-02-21 LAB — RAPID URINE DRUG SCREEN, HOSP PERFORMED
Amphetamines: NOT DETECTED
Barbiturates: NOT DETECTED
Benzodiazepines: POSITIVE — AB
Cocaine: NOT DETECTED
Opiates: NOT DETECTED
Tetrahydrocannabinol: NOT DETECTED

## 2019-02-21 LAB — CK: Total CK: 64 U/L (ref 49–397)

## 2019-02-21 LAB — TROPONIN I (HIGH SENSITIVITY)
Troponin I (High Sensitivity): 6 ng/L (ref <18)
Troponin I (High Sensitivity): 7 ng/L (ref <18)

## 2019-02-21 LAB — LIPASE, BLOOD: Lipase: 23 U/L (ref 11–51)

## 2019-02-21 MED ORDER — DICYCLOMINE HCL 20 MG PO TABS: 20.0000 mg | ORAL_TABLET | Freq: Three times a day (TID) | ORAL | 0 refills | Status: DC | PRN

## 2019-02-21 MED ORDER — IOHEXOL 300 MG/ML  SOLN
100.0000 mL | Freq: Once | INTRAMUSCULAR | Status: AC | PRN
  Administered 2019-02-21: 02:00:00 100 mL via INTRAVENOUS

## 2019-02-21 MED ORDER — DICYCLOMINE HCL 10 MG/ML IM SOLN
20.0000 mg | Freq: Once | INTRAMUSCULAR | Status: AC
  Administered 2019-02-21: 02:00:00 20 mg via INTRAMUSCULAR
  Filled 2019-02-21: qty 2

## 2019-02-21 MED ORDER — SODIUM CHLORIDE 0.9 % IV BOLUS
1000.0000 mL | Freq: Once | INTRAVENOUS | Status: AC
  Administered 2019-02-21: 1000 mL via INTRAVENOUS

## 2019-02-21 MED ORDER — ONDANSETRON HCL 4 MG/2ML IJ SOLN
4.0000 mg | Freq: Once | INTRAMUSCULAR | Status: AC
  Administered 2019-02-21: 4 mg via INTRAVENOUS
  Filled 2019-02-21: qty 2

## 2019-02-21 MED ORDER — METHOCARBAMOL 500 MG PO TABS
1000.0000 mg | ORAL_TABLET | Freq: Once | ORAL | Status: AC
  Administered 2019-02-21: 03:00:00 1000 mg via ORAL
  Filled 2019-02-21: qty 2

## 2019-02-21 MED ORDER — HYDROXYZINE HCL 25 MG PO TABS: 25.0000 mg | ORAL_TABLET | Freq: Four times a day (QID) | ORAL | 0 refills | Status: DC | PRN

## 2019-02-21 MED ORDER — HYDROXYZINE HCL 25 MG PO TABS
25.0000 mg | ORAL_TABLET | Freq: Once | ORAL | Status: AC
  Administered 2019-02-21: 03:00:00 25 mg via ORAL
  Filled 2019-02-21: qty 1

## 2019-02-21 MED ORDER — ONDANSETRON 4 MG PO TBDP: 4.0000 mg | ORAL_TABLET | Freq: Three times a day (TID) | ORAL | 0 refills | Status: DC | PRN

## 2019-02-21 NOTE — ED Notes (Signed)
ED Provider at bedside. 

## 2019-02-21 NOTE — Progress Notes (Signed)
Per Lindon Romp, NP pt does not meet criteria for inpt tx. Pt is recommended to f/u with OPT providers to address detox needs and to contact current medical provider to address medication changes. EDP Rancour, Annie Main, MD has been advised. Pt's nurse unavailable at this time. TTS advised Marjorie Smolder the pt does not meet criteria for inpt tx and obtained fax number [phone] for referrals.   Lind Covert, MSW, LCSW Therapeutic Triage Specialist  [phone]

## 2019-02-21 NOTE — Discharge Instructions (Signed)
Talk with your physician about stopping your stopping your ativan and oxycodone.  Follow-up with the detox resources you were given.  Do not stop your Ativan cold [place] because this can cause seizures which can be life-threatening.  Return to the ED if you develop new or worsening symptoms.

## 2019-02-21 NOTE — ED Provider Notes (Signed)
[facility] Provider Note   CSN: [id number] Arrival date & time: 02/20/19  2114     History   Chief Complaint Chief Complaint  Patient presents with  . Withdrawal    HPI Clayton LevyRichard W Ebner is a 58 y.o. male.     Patient with history of chronic back pain on chronic oxycodone and Ativan presenting with requesting help with detox.  He states he wants to get off of his chronic pain medications because they are making him nauseated and difficult to eat.  He has cut back on his 10 mg of oxycodone every 4 hours to  7.5 mg every 6 hours over the past several weeks.  He states he stopped his Ativan yesterday which he only takes 3 times a day and stopped cold [place] yesterday afternoon.  He denies any alcohol use.  He states he wants to come off of his medications because they are causing him nausea making it difficult to eat.  He complains of abdominal cramping, nausea, bilateral leg cramping and headache.  No chest pain or shortness of breath.  He states he "does not want to keep living like this" but denies any active suicidal or homicidal plan.  No hallucinations.  No fever.  No bowel or bladder incontinence.  No pain with urination or blood in the urine.  The history is provided by the patient.    Past Medical History:  Diagnosis Date  . Anxiety   . Arthritis   . Chronic back pain   . Chronic back pain   . Chronic neck pain   . Coronary artery disease    a. 2000: s/p PCI/stenting of prox LAD and RCA @ [facility];  b. 09/2012 Cath/PCI: LM 30d, LAD 40p ISR, 3172m, 4839m/d (2.25x20 Promus Premier DES), LCX 30p, RCA 80p (3.0x24 Promus Premier DES), patent mid stent, 1572m, PDA 3916m (2.75x28 Promus Premier DES), EF 60-65%. c. cath 09/26/2014 EF 65%, patent LAD stents, DES 2.5 x 12 Synergy to mid LAD, 80% RCA stenosis treated with balloon angio  . Essential hypertension   . GERD (gastroesophageal reflux disease)   . Headache   . Hyperlipidemia   . Myocardial infarction (HCC)   .  Neuromuscular disorder (HCC)   . Postlaminectomy syndrome   . Type 2 diabetes mellitus [facility](HCC)     Patient Active Problem List   Diagnosis Date Noted  . Anxiety 10/08/2017  . Arthritis 10/08/2017  . Depression 10/08/2017  . NSTEMI (non-ST elevated myocardial infarction) (HCC) 09/26/2014  . Hyponatremia 10/10/2012  . Chest pain 07/22/2012  . CAD (coronary artery disease) 07/22/2012  . Panic disorder 07/22/2012  . Tobacco use 07/22/2012  . GERD (gastroesophageal reflux disease) 07/22/2012  . Diabetes mellitus, type 2 (HCC) 09/28/2011  . Hypertension 09/28/2011  . High cholesterol 09/28/2011  . Back pain with radiculopathy 09/28/2011    Past Surgical History:  Procedure Laterality Date  . APPENDECTOMY    . BACK SURGERY    . CARDIAC CATHETERIZATION    . CORONARY ANGIOPLASTY    . LEFT HEART CATHETERIZATION WITH CORONARY ANGIOGRAM N/A 10/11/2012   Procedure: LEFT HEART CATHETERIZATION WITH CORONARY ANGIOGRAM;  Surgeon: Laurey Moralealton S McLean, MD;  [facility];  Service: Cardiovascular;  Laterality: N/A;  . LEFT HEART CATHETERIZATION WITH CORONARY ANGIOGRAM N/A 09/26/2014   Procedure: LEFT HEART CATHETERIZATION WITH CORONARY ANGIOGRAM;  Surgeon: Corky CraftsJayadeep S Varanasi, MD;  [facility];  Service: Cardiovascular;  Laterality: N/A;  . MULTIPLE EXTRACTIONS WITH ALVEOLOPLASTY N/A 01/22/2015  Procedure: MULTIPLE EXTRACTIONS # 2, 4, 5, 6, 7, 10, 11, 12, 13, 15, 20, 21, 22, 23, 24, 25, 26, 27, 28, 31, and Alveloplasty;  Surgeon: Ocie DoyneScott Jensen, DDS;  Location: [facility] OR;  Service: Oral Surgery;  Laterality: N/A;  . PERCUTANEOUS CORONARY STENT INTERVENTION (PCI-S) N/A 10/14/2012   Procedure: PERCUTANEOUS CORONARY STENT INTERVENTION (PCI-S);  Surgeon: Tonny BollmanMichael Cooper, MD;  [facility];  Service: Cardiovascular;  Laterality: N/A;  . PERCUTANEOUS CORONARY STENT INTERVENTION (PCI-S)  09/26/2014   Procedure: PERCUTANEOUS CORONARY STENT INTERVENTION (PCI-S);  Surgeon: Corky CraftsJayadeep S Varanasi, MD;   [facility];  Service: Cardiovascular;;  . TONSILLECTOMY          Home Medications    Prior to Admission medications   Medication Sig Start Date End Date Taking? Authorizing Provider  aspirin EC 81 MG tablet Take 81 mg by mouth every morning.     [provider]  atorvastatin (LIPITOR) 80 MG tablet Take 1 tablet (80 mg total) by mouth daily at 6 PM. 09/27/14   Azalee CourseMeng, Hao, PA  cetirizine (ZYRTEC) 10 MG tablet Take 10 mg by mouth daily as needed for allergies.    [provider]  dicyclomine (BENTYL) 20 MG tablet Take 1 tablet (20 mg total) by mouth every 6 (six) hours as needed for spasms. 11/30/18   Jacalyn LefevreHaviland, Julie, MD  dicyclomine (BENTYL) 20 MG tablet Take 1 tablet (20 mg total) by mouth 2 (two) times daily. Patient not taking: Reported on 01/02/2019 01/02/19   Horton, Mayer Maskerourtney F, MD  FAMOTIDINE PO Take 1 tablet by mouth 2 (two) times a day.     [provider]  gabapentin (NEURONTIN) 300 MG capsule Take 600 mg by mouth every 6 (six) hours.  11/13/13   Philip AspenHernandez Acosta, Limmie PatriciaEstela Y, MD  glipiZIDE (GLUCOTROL) 10 MG tablet Take 5-10 mg by mouth as needed. Take 1 tablet by mouth daily as needed. Can either take 1/2 or 1 tablet depending on sugar    [provider]  hydrOXYzine (ATARAX/VISTARIL) 25 MG tablet Take 1 tablet (25 mg total) by mouth every 6 (six) hours. 11/30/18   Jacalyn LefevreHaviland, Julie, MD  lisinopril-hydrochlorothiazide (PRINZIDE,ZESTORETIC) 20-25 MG per tablet Take 1 tablet by mouth daily.    [provider]  LORazepam (ATIVAN) 1 MG tablet Take 1 mg by mouth 3 (three) times daily.    [provider]  metFORMIN (GLUCOPHAGE) 500 MG tablet Take 500 mg by mouth 2 (two) times daily with a meal.    [provider]  methocarbamol (ROBAXIN) 500 MG tablet Take 1 tablet (500 mg total) by mouth every 8 (eight) hours as needed for muscle spasms. 11/30/18   Jacalyn LefevreHaviland, Julie, MD  metoprolol tartrate (LOPRESSOR) 25 MG tablet Take 1 tablet (25  mg total) by mouth 2 (two) times daily. 09/27/14   Azalee CourseMeng, Hao, PA  nitroGLYCERIN (NITROSTAT) 0.4 MG SL tablet Place 1 tablet (0.4 mg total) under the tongue every 5 (five) minutes as needed for chest pain. Patient not taking: Reported on 01/02/2019 01/29/14   Jodelle GrossLawrence, Kathryn M, NP  ondansetron (ZOFRAN) 4 MG tablet Take 1 tablet (4 mg total) by mouth every 6 (six) hours as needed. Patient not taking: Reported on 01/02/2019 12/15/18   Dione BoozeGlick, David, MD  Oxycodone HCl 10 MG TABS Take 10 mg by mouth See admin instructions. Take 1 tablet by mouth every 4-6 hours as needed for pain    [provider]  pantoprazole (PROTONIX) 40 MG tablet Take 40 mg by  mouth daily as needed (acid reflux).     [provider]  polyethylene glycol-electrolytes (NULYTELY/GOLYTELY) 420 G solution Take 4,000 mLs by mouth once. Patient not taking: Reported on 01/02/2019 01/19/15   Butch Penny, NP    Family History Family History  Problem Relation Age of Onset  . Hypertension Father     Social History Social History   Tobacco Use  . Smoking status: Current Every Day Smoker    Packs/day: 0.50    Types: Cigarettes    Start date: 07/24/1977  . Smokeless tobacco: Former Network engineer Use Topics  . Alcohol use: No    Alcohol/week: 0.0 standard drinks  . Drug use: No     Allergies   Patient has no known allergies.   Review of Systems Review of Systems  Constitutional: Positive for activity change, appetite change and fatigue. Negative for fever.  HENT: Negative for congestion and rhinorrhea.   Eyes: Negative for visual disturbance.  Respiratory: Negative for cough and shortness of breath.   Cardiovascular: Negative for chest pain.  Gastrointestinal: Positive for abdominal pain, diarrhea and nausea. Negative for vomiting.  Genitourinary: Negative for dysuria.  Musculoskeletal: Positive for arthralgias, back pain and myalgias.  Neurological: Positive for weakness. Negative for dizziness,  light-headedness and headaches.    all other systems are negative except as noted in the HPI and PMH.    Physical Exam Updated Vital Signs BP (!) 158/109 (BP Location: Right Arm)   Pulse (!) 57   Temp 97.8 F (36.6 C) (Oral)   Resp 17   Ht 6\' 3"  (1.905 m)   Wt 95.3 kg   SpO2 98%   BMI 26.25 kg/m   Physical Exam Vitals signs and nursing note reviewed.  Constitutional:      General: He is not in acute distress.    Appearance: Normal appearance. He is well-developed and normal weight. He is not ill-appearing or toxic-appearing.  HENT:     Head: Normocephalic and atraumatic.     Nose: Nose normal.     Mouth/Throat:     Pharynx: No oropharyngeal exudate.  Eyes:     Conjunctiva/sclera: Conjunctivae normal.     Pupils: Pupils are equal, round, and reactive to light.  Neck:     Musculoskeletal: Normal range of motion and neck supple.     Comments: No meningismus. Cardiovascular:     Rate and Rhythm: Normal rate and regular rhythm.     Heart sounds: Normal heart sounds. No murmur.     Comments: Equal femoral pulses Pulmonary:     Effort: Pulmonary effort is normal. No respiratory distress.     Breath sounds: Normal breath sounds.  Abdominal:     Palpations: Abdomen is soft.     Tenderness: There is abdominal tenderness. There is no guarding or rebound.     Comments: Mild diffuse tenderness  Musculoskeletal: Normal range of motion.        General: No tenderness.     Comments: 5/5 strength in bilateral lower extremities. Ankle plantar and dorsiflexion intact. Great toe extension intact bilaterally. +2 DP and PT pulses.   Skin:    General: Skin is warm.     Capillary Refill: Capillary refill takes less than 2 seconds.  Neurological:     General: No focal deficit present.     Mental Status: He is alert and oriented to person, place, and time. Mental status is at baseline.     Cranial Nerves: No cranial nerve deficit.  Motor: No abnormal muscle tone.     Coordination:  Coordination normal.     Comments: No ataxia on finger to nose bilaterally. No pronator drift. 5/5 strength throughout. CN 2-12 intact.Equal grip strength. Sensation intact.   Psychiatric:        Behavior: Behavior normal.      ED Treatments / Results  Labs (all labs ordered are listed, but only abnormal results are displayed) Labs Reviewed  COMPREHENSIVE METABOLIC PANEL - Abnormal; Notable for the following components:      Result Value   Glucose, Bld 157 (*)    All other components within normal limits  RAPID URINE DRUG SCREEN, HOSP PERFORMED - Abnormal; Notable for the following components:   Benzodiazepines POSITIVE (*)    All other components within normal limits  ETHANOL  CBC WITH DIFFERENTIAL/PLATELET  LIPASE, BLOOD  CK  TROPONIN I (HIGH SENSITIVITY)  TROPONIN I (HIGH SENSITIVITY)    EKG EKG Interpretation  Date/Time:  Friday February 21 2019 01:25:38 EDT Ventricular Rate:  48 PR Interval:    QRS Duration: 112 QT Interval:  469 QTC Calculation: 419 R Axis:   -52 Text Interpretation:  Sinus bradycardia Incomplete left bundle branch block Abnormal inferior Q waves Borderline ST elevation, anterior leads Baseline wander in lead(s) V2 No significant change was found Confirmed by Glynn Octave [phone]) on 02/21/2019 2:09:11 AM   Radiology Ct Abdomen Pelvis W Contrast  Result Date: 02/21/2019 CLINICAL DATA:  Abdominal pain EXAM: CT ABDOMEN AND PELVIS WITH CONTRAST TECHNIQUE: Multidetector CT imaging of the abdomen and pelvis was performed using the standard protocol following bolus administration of intravenous contrast. CONTRAST:  OMNIPAQUE IOHEXOL 300 MG/ML  SOLN COMPARISON:  01/02/2019, 12/15/2018 FINDINGS: Lower chest: Lung bases demonstrate no acute consolidation or effusion. Coronary vascular calcification. Heart size within normal limits Hepatobiliary: No focal liver abnormality is seen. No gallstones, gallbladder wall thickening, or biliary dilatation. Pancreas:  Unremarkable. No pancreatic ductal dilatation or surrounding inflammatory changes. Spleen: Normal in size without focal abnormality. Adrenals/Urinary Tract: Adrenal glands are unremarkable. Kidneys are normal, without renal calculi, focal lesion, or hydronephrosis. Bladder is slightly thick walled. Stomach/Bowel: Stomach is within normal limits. No evidence of bowel wall thickening, distention, or inflammatory changes. Vascular/Lymphatic: Moderate aortic atherosclerosis. No aneurysm. No significantly enlarged lymph nodes Reproductive: Enlarged prostate mass effect on the bladder Other: Negative for free air or free fluid Musculoskeletal: Posterior spinal rods and fixating screws L3 through S1 with anterior plate and fixating screws at L5-S1. No acute or suspicious osseous abnormality. IMPRESSION: 1. No CT evidence for acute intra-abdominal or pelvic abnormality. 2. Prostatomegaly Electronically Signed   By: Jasmine Pang M.D.   On: 02/21/2019 02:34    Procedures Procedures (including critical care time)  Medications Ordered in ED Medications  ondansetron (ZOFRAN) injection 4 mg (has no administration in time range)  dicyclomine (BENTYL) injection 20 mg (has no administration in time range)     Initial Impression / Assessment and Plan / ED Course  I have reviewed the triage vital signs and the nursing notes.  Pertinent labs & imaging results that were available during my care of the patient were reviewed by me and considered in my medical decision making (see chart for details).       Patient with chronic opiate and benzodiazepine use presenting with concern for wanting detox.  States he is been feeling poorly since trying to detox himself having abdominal pain, nausea, cramps, headache. States he "does not want live like this" but denies  any suicidal plan.  Results Reassuring.  Troponin negative. EKG shows sinus bradycardia which argues Against any kind of withdrawal.  There is no evidence  of acute life-threatening alcohol or benzodiazepine withdrawal at this point.  Patient cautioned that he cannot stop his benzodiazepines as they can precipitate seizure.   Abdominal CT scan is reassuring.  He is able to tolerate p.o. without peritoneal signs. Does have some sinus bradycardia which patient has had during previous ED visits.  He does take a beta-blocker.  He denies any dizziness or lightheadedness.  Patient was seen by TTS given his statements of "not wanting to live like this" but was not actively suicidal and does not have a suicidal plan.  He is not inpatient criteria for TTS.  Outpatient resources given for detox.  Discussed with patient he needs to speak with his prescribed about needing to come off of his chronic medications.  Cautioned that he should not stop his medications cold [place]key as benzodiazepine withdrawal can be life-threatening. We will treat his opiate withdrawal symptomatically with Bentyl, Zofran, Atarax.  Return precautions discussed.  Final Clinical Impressions(s) / ED Diagnoses   Final diagnoses:  Opiate withdrawal [facility](HCC)    ED Discharge Orders    None       Glynn Octaveancour, Lerone Onder, MD 02/21/19 [phone]

## 2019-02-21 NOTE — BH Assessment (Addendum)
Tele Assessment Note   Patient Name: Clayton Foster MRN: [id number] Referring Physician: Glynn Octaveancour, Stephen, MD Location of [facility]: [facility] Location of Provider: Behavioral Health TTS Department  Clayton Foster is an 58 y.o. male who presents to the ED voluntarily. Pt reports he has been feeling pain in his back and has been using oxycodone to manage his back pain for the past 2 years. Pt states he had back surgery in 2018 and he has been using Ativan and oxycodone ever since. Pt states he wants help getting off of the medication because he does not like the way it makes him feel. Pt states he has lost 30 lbs due to loss of appetite. Pt states he does not like the way the medication makes his stomach feel. Pt states he told the EDP that he sometimes wishes he was dead due to the pain and the frustration. Pt states he does get upset when he thinks about everything he has to deal with but he denies SI, HI, or AVH. Pt states he does not want to die and has no plan or intent. Pt states he feels frustrated because the medication makes his back pain feel worse and he does not know what else to do. Pt states his next appointment with his spine and scoliosis doctor is in August. Pt states he is "just ready to be off of the medication." Pt states he does not have a current MH provider but states 25 years ago he struggled with anxiety and saw a psychiatrist due to panic attacks. Pt denies other complaints and stressors.  Per Nira ConnJason Berry, NP pt does not meet criteria for inpt tx. Pt is recommended to f/u with OPT providers to address detox needs and to contact current medical provider to address medication changes. EDP Rancour, Jeannett SeniorStephen, MD has been advised. Pt's nurse unavailable at this time. TTS advised Clayton Foster the pt does not meet criteria for inpt tx and obtained fax number [phone] for referrals.   Diagnosis: Unspecified depressive d/o; Unspecified anxiety d/o  Past Medical History:  Past Medical  History:  Diagnosis Date  . Anxiety   . Arthritis   . Chronic back pain   . Chronic back pain   . Chronic neck pain   . Coronary artery disease    a. 2000: s/p PCI/stenting of prox LAD and RCA @ [facility];  b. 09/2012 Cath/PCI: LM 30d, LAD 40p ISR, 6132m, 3532m/d (2.25x20 Promus Premier DES), LCX 30p, RCA 80p (3.0x24 Promus Premier DES), patent mid stent, 5732m, PDA 2112m (2.75x28 Promus Premier DES), EF 60-65%. c. cath 09/26/2014 EF 65%, patent LAD stents, DES 2.5 x 12 Synergy to mid LAD, 80% RCA stenosis treated with balloon angio  . Essential hypertension   . GERD (gastroesophageal reflux disease)   . Headache   . Hyperlipidemia   . Myocardial infarction (HCC)   . Neuromuscular disorder (HCC)   . Postlaminectomy syndrome   . Type 2 diabetes mellitus (HCC)     Past Surgical History:  Procedure Laterality Date  . APPENDECTOMY    . BACK SURGERY    . CARDIAC CATHETERIZATION    . CORONARY ANGIOPLASTY    . LEFT HEART CATHETERIZATION WITH CORONARY ANGIOGRAM N/A 10/11/2012   Procedure: LEFT HEART CATHETERIZATION WITH CORONARY ANGIOGRAM;  Surgeon: Laurey Moralealton S McLean, MD;  [facility];  Service: Cardiovascular;  Laterality: N/A;  . LEFT HEART CATHETERIZATION WITH CORONARY ANGIOGRAM N/A 09/26/2014   Procedure: LEFT HEART CATHETERIZATION WITH CORONARY ANGIOGRAM;  Surgeon: Donnie CoffinJayadeep S  Eldridge DaceVaranasi, MD;  [facility];  Service: Cardiovascular;  Laterality: N/A;  . MULTIPLE EXTRACTIONS WITH ALVEOLOPLASTY N/A 01/22/2015   Procedure: MULTIPLE EXTRACTIONS # 2, 4, 5, 6, 7, 10, 11, 12, 13, 15, 20, 21, 22, 23, 24, 25, 26, 27, 28, 31, and Alveloplasty;  Surgeon: Ocie DoyneScott Jensen, DDS;  Location: [facility] OR;  Service: Oral Surgery;  Laterality: N/A;  . PERCUTANEOUS CORONARY STENT INTERVENTION (PCI-S) N/A 10/14/2012   Procedure: PERCUTANEOUS CORONARY STENT INTERVENTION (PCI-S);  Surgeon: Tonny BollmanMichael Cooper, MD;  [facility];  Service: Cardiovascular;  Laterality: N/A;  . PERCUTANEOUS CORONARY STENT INTERVENTION  (PCI-S)  09/26/2014   Procedure: PERCUTANEOUS CORONARY STENT INTERVENTION (PCI-S);  Surgeon: Corky CraftsJayadeep S Varanasi, MD;  [facility];  Service: Cardiovascular;;  . TONSILLECTOMY      Family History:  Family History  Problem Relation Age of Onset  . Hypertension Father     Social History:  reports that he has been smoking cigarettes. He started smoking about 41 years ago. He has been smoking about 0.50 packs per day. He has quit using smokeless tobacco. He reports that he does not drink alcohol or use drugs.  Additional Social History:  Alcohol / Drug Use Pain Medications: See MAR Prescriptions: See MAR Over the Counter: See MAR History of alcohol / drug use?: No history of alcohol / drug abuse  CIWA: CIWA-Ar BP: (!) 166/78 Pulse Rate: (!) 45 COWS: Clinical Opiate Withdrawal Scale (COWS) Resting Pulse Rate: Pulse Rate 80 or below Sweating: Subjective report of chills or flushing Restlessness: Able to sit still Pupil Size: Pupils pinned or normal size for room light Bone or Joint Aches: Patient reports sever diffuse aching of joints/muscles Runny Nose or Tearing: Not present GI Upset: Stomach cramps Tremor: No tremor Yawning: No yawning Anxiety or Irritability: Patient reports increasing irritability or anxiousness Gooseflesh Skin: Skin is smooth COWS Total Score: 5  Allergies: No Known Allergies  Home Medications: (Not in a hospital admission)   OB/GYN Status:  No LMP for male patient.  General Assessment Data Location of Assessment: [facility] TTS Assessment: In system Is this a Tele or Face-to-Face Assessment?: Tele Assessment Is this an Initial Assessment or a Re-assessment for this encounter?: Initial Assessment Patient Accompanied by:: N/A Language Other than English: No Living Arrangements: Other (Comment) What gender do you identify as?: Male Marital status: Divorced Pregnancy Status: No Living Arrangements: Parent Can pt return to current living  arrangement?: Yes Admission Status: Voluntary Is patient capable of signing voluntary admission?: Yes Referral Source: Self/Family/Friend Insurance type: MCD     Crisis Care Plan Living Arrangements: Parent Name of Psychiatrist: none Name of Therapist: none  Education Status Is patient currently in school?: No Is the patient employed, unemployed or receiving disability?: Receiving disability income  Risk to self with the past 6 months Suicidal Ideation: No Has patient been a risk to self within the past 6 months prior to admission? : No Suicidal Intent: No Has patient had any suicidal intent within the past 6 months prior to admission? : No Is patient at risk for suicide?: No Suicidal Plan?: No Has patient had any suicidal plan within the past 6 months prior to admission? : No Access to Means: No What has been your use of drugs/alcohol within the last 12 months?: denies  Previous Attempts/Gestures: No Triggers for Past Attempts: None known Intentional Self Injurious Behavior: None Family Suicide History: No Recent stressful life event(s): Recent negative physical changes Persecutory voices/beliefs?: No Depression: Yes Depression  Symptoms: Loss of interest in usual pleasures, Insomnia, Feeling worthless/self pity Substance abuse history and/or treatment for substance abuse?: Yes(20 years ago) Suicide prevention information given to non-admitted patients: Not applicable  Risk to Others within the past 6 months Homicidal Ideation: No Does patient have any lifetime risk of violence toward others beyond the six months prior to admission? : No Thoughts of Harm to Others: No Current Homicidal Intent: No Current Homicidal Plan: No Access to Homicidal Means: No History of harm to others?: No Assessment of Violence: None Noted Does patient have access to weapons?: No Criminal Charges Pending?: No Does patient have a court date: No Is patient on probation?:  No  Psychosis Hallucinations: None noted Delusions: None noted  Mental Status Report Appearance/Hygiene: Unremarkable Eye Contact: Good Motor Activity: Freedom of movement Speech: Logical/coherent Level of Consciousness: Alert Mood: Depressed, Anxious Affect: Anxious, Depressed Anxiety Level: Moderate Thought Processes: Coherent, Relevant Judgement: Unimpaired Orientation: Person, Place, Time, Situation, Appropriate for developmental age Obsessive Compulsive Thoughts/Behaviors: None  Cognitive Functioning Concentration: Normal Memory: Remote Intact, Recent Intact Is patient IDD: No Insight: Good Impulse Control: Good Appetite: Poor Have you had any weight changes? : Loss Amount of the weight change? (lbs): 30 lbs Sleep: Decreased Total Hours of Sleep: 4 Vegetative Symptoms: None  ADLScreening [facility] Assessment Services) Patient's cognitive ability adequate to safely complete daily activities?: Yes Patient able to express need for assistance with ADLs?: Yes Independently performs ADLs?: Yes (appropriate for developmental age)  Prior Inpatient Therapy Prior Inpatient Therapy: No  Prior Outpatient Therapy Prior Outpatient Therapy: Yes Prior Therapy Dates: 30 years ago Prior Therapy Facilty/Provider(s): unable to recall Reason for Treatment: anxiety  Does patient have an ACCT team?: No Does patient have Intensive In-House Services?  : No Does patient have [facility] services? : No Does patient have P4CC services?: No  ADL Screening (condition at time of admission) Patient's cognitive ability adequate to safely complete daily activities?: Yes Is the patient deaf or have difficulty hearing?: No Does the patient have difficulty seeing, even when wearing glasses/contacts?: No Does the patient have difficulty concentrating, remembering, or making decisions?: No Patient able to express need for assistance with ADLs?: Yes Does the patient have difficulty dressing or bathing?:  No Independently performs ADLs?: Yes (appropriate for developmental age) Does the patient have difficulty walking or climbing stairs?: Yes Weakness of Legs: Both Weakness of Arms/Hands: None  Home Assistive Devices/Equipment Home Assistive Devices/Equipment: Cane (specify quad or straight)    Abuse/Neglect Assessment (Assessment to be complete while patient is alone) Abuse/Neglect Assessment Can Be Completed: Yes Physical Abuse: Denies Verbal Abuse: Denies Sexual Abuse: Denies Exploitation of patient/patient's resources: Denies Self-Neglect: Denies     Regulatory affairs officer (For Healthcare) Does Patient Have a Medical Advance Directive?: No Would patient like information on creating a medical advance directive?: No - Patient declined          Disposition: Per Lindon Romp, NP pt does not meet criteria for inpt tx. Pt is recommended to f/u with OPT providers to address detox needs and to contact current medical provider to address medication changes. EDP Rancour, Annie Main, MD has been advised. Pt's nurse unavailable at this time. TTS advised Marjorie Smolder the pt does not meet criteria for inpt tx and obtained fax number [phone] for referrals.   Disposition Initial Assessment Completed for this Encounter: Yes Disposition of Patient: Discharge Patient refused recommended treatment: No Mode of transportation if patient is discharged/movement?: Car Patient referred to: Outpatient clinic referral(OPT providers)  This service was  provided via telemedicine using a 2-way, interactive audio and video technology.  Names of all persons participating in this telemedicine service and their role in this encounter. Name: Adelfa KohRichard W Hershberger Role: Patient  Name: Princess Bruinsquicha Luanne Krzyzanowski Role: TTS          Karolee Ohsquicha R Eliz Nigg 02/21/2019 3:19 AM

## 2019-02-26 ENCOUNTER — Emergency Department (HOSPITAL_COMMUNITY)
Admission: EM | Admit: 2019-02-26 | Discharge: 2019-02-26 | Disposition: A | Discharge status: Home / Self Care | Payer: Medicaid Other | Attending: Emergency Medicine | Admitting: Emergency Medicine

## 2019-02-26 ENCOUNTER — Encounter (HOSPITAL_COMMUNITY): Payer: Self-pay | Admitting: Emergency Medicine

## 2019-02-26 ENCOUNTER — Other Ambulatory Visit: Payer: Self-pay

## 2019-02-26 DIAGNOSIS — Z7984 Long term (current) use of oral hypoglycemic drugs: Secondary | ICD-10-CM | POA: Insufficient documentation

## 2019-02-26 DIAGNOSIS — R1030 Lower abdominal pain, unspecified: Secondary | ICD-10-CM

## 2019-02-26 DIAGNOSIS — Z79899 Other long term (current) drug therapy: Secondary | ICD-10-CM | POA: Diagnosis not present

## 2019-02-26 DIAGNOSIS — I10 Essential (primary) hypertension: Secondary | ICD-10-CM | POA: Diagnosis not present

## 2019-02-26 DIAGNOSIS — Z7982 Long term (current) use of aspirin: Secondary | ICD-10-CM | POA: Insufficient documentation

## 2019-02-26 DIAGNOSIS — E119 Type 2 diabetes mellitus without complications: Secondary | ICD-10-CM | POA: Diagnosis not present

## 2019-02-26 DIAGNOSIS — I252 Old myocardial infarction: Secondary | ICD-10-CM | POA: Insufficient documentation

## 2019-02-26 DIAGNOSIS — I2510 Atherosclerotic heart disease of native coronary artery without angina pectoris: Secondary | ICD-10-CM | POA: Diagnosis not present

## 2019-02-26 DIAGNOSIS — F17210 Nicotine dependence, cigarettes, uncomplicated: Secondary | ICD-10-CM | POA: Diagnosis not present

## 2019-02-26 LAB — URINALYSIS, ROUTINE W REFLEX MICROSCOPIC
Bacteria, UA: NONE SEEN
Bilirubin Urine: NEGATIVE
Glucose, UA: >=500 mg/dL — AB
Hgb urine dipstick: NEGATIVE
Ketones, ur: NEGATIVE mg/dL
Leukocytes,Ua: NEGATIVE
Nitrite: NEGATIVE
Protein, ur: NEGATIVE mg/dL
Specific Gravity, Urine: 1.002 — ABNORMAL LOW (ref 1.005–1.030)
pH: 6 (ref 5.0–8.0)

## 2019-02-26 LAB — CBG MONITORING, ED: Glucose-Capillary: 116 mg/dL — ABNORMAL HIGH (ref 70–99)

## 2019-02-26 MED ORDER — HYDROXYZINE HCL 25 MG PO TABS: ORAL_TABLET | ORAL | 0 refills | Status: DC

## 2019-02-26 MED ORDER — HYDROXYZINE HCL 25 MG PO TABS
25.0000 mg | ORAL_TABLET | Freq: Once | ORAL | Status: AC
  Administered 2019-02-26: 23:00:00 25 mg via ORAL
  Filled 2019-02-26: qty 1

## 2019-02-26 NOTE — Discharge Instructions (Signed)
Follow up with dr. Wonda Amis for stomach cramps and follow up with [facility] for your prostate

## 2019-02-26 NOTE — ED Triage Notes (Signed)
Pt was seen here for same x 2 days ago, pt c/o lower abd pain that radiates thru his abd to lower back, pain radiates down both legs to thighs x 2 days, pt reports pain is chronic and recurrent

## 2019-02-26 NOTE — ED Provider Notes (Signed)
[facility] EMERGENCY DEPARTMENT Provider Note   CSN: [id number] Arrival date & time: 02/26/19  2009     History   Chief Complaint Chief Complaint  Patient presents with  . Abdominal Pain    HPI Clayton Foster is a 58 y.o. male.     Pt complains of lower abd pain.  He was seen recently and had a ct abd which showed enlarged prostate no nausea no vomiting no diarrhea  The history is provided by the patient and medical records. No language interpreter was used.  Abdominal Pain Pain location:  Generalized Pain quality: aching   Pain radiates to:  Does not radiate Pain severity:  Mild Onset quality:  Sudden Timing:  Constant Progression:  Worsening Chronicity:  New Context: not alcohol use   Relieved by:  Nothing Associated symptoms: no chest pain, no cough, no diarrhea, no fatigue and no hematuria     Past Medical History:  Diagnosis Date  . Anxiety   . Arthritis   . Chronic back pain   . Chronic back pain   . Chronic neck pain   . Coronary artery disease    a. 2000: s/p PCI/stenting of prox LAD and RCA @ [facility];  b. 09/2012 Cath/PCI: LM 30d, LAD 40p ISR, 768m, 6132m/d (2.25x20 Promus Premier DES), LCX 30p, RCA 80p (3.0x24 Promus Premier DES), patent mid stent, 468m, PDA 3411m (2.75x28 Promus Premier DES), EF 60-65%. c. cath 09/26/2014 EF 65%, patent LAD stents, DES 2.5 x 12 Synergy to mid LAD, 80% RCA stenosis treated with balloon angio  . Essential hypertension   . GERD (gastroesophageal reflux disease)   . Headache   . Hyperlipidemia   . Myocardial infarction (HCC)   . Neuromuscular disorder (HCC)   . Postlaminectomy syndrome   . Type 2 diabetes mellitus [facility](HCC)     Patient Active Problem List   Diagnosis Date Noted  . Anxiety 10/08/2017  . Arthritis 10/08/2017  . Depression 10/08/2017  . NSTEMI (non-ST elevated myocardial infarction) (HCC) 09/26/2014  . Hyponatremia 10/10/2012  . Chest pain 07/22/2012  . CAD (coronary artery disease) 07/22/2012  . Panic disorder  07/22/2012  . Tobacco use 07/22/2012  . GERD (gastroesophageal reflux disease) 07/22/2012  . Diabetes mellitus, type 2 (HCC) 09/28/2011  . Hypertension 09/28/2011  . High cholesterol 09/28/2011  . Back pain with radiculopathy 09/28/2011    Past Surgical History:  Procedure Laterality Date  . APPENDECTOMY    . BACK SURGERY    . CARDIAC CATHETERIZATION    . CORONARY ANGIOPLASTY    . LEFT HEART CATHETERIZATION WITH CORONARY ANGIOGRAM N/A 10/11/2012   Procedure: LEFT HEART CATHETERIZATION WITH CORONARY ANGIOGRAM;  Surgeon: Laurey Moralealton S McLean, MD;  [facility];  Service: Cardiovascular;  Laterality: N/A;  . LEFT HEART CATHETERIZATION WITH CORONARY ANGIOGRAM N/A 09/26/2014   Procedure: LEFT HEART CATHETERIZATION WITH CORONARY ANGIOGRAM;  Surgeon: Corky CraftsJayadeep S Varanasi, MD;  [facility];  Service: Cardiovascular;  Laterality: N/A;  . MULTIPLE EXTRACTIONS WITH ALVEOLOPLASTY N/A 01/22/2015   Procedure: MULTIPLE EXTRACTIONS # 2, 4, 5, 6, 7, 10, 11, 12, 13, 15, 20, 21, 22, 23, 24, 25, 26, 27, 28, 31, and Alveloplasty;  Surgeon: Ocie DoyneScott Jensen, DDS;  Location: [facility] OR;  Service: Oral Surgery;  Laterality: N/A;  . PERCUTANEOUS CORONARY STENT INTERVENTION (PCI-S) N/A 10/14/2012   Procedure: PERCUTANEOUS CORONARY STENT INTERVENTION (PCI-S);  Surgeon: Tonny BollmanMichael Cooper, MD;  [facility];  Service: Cardiovascular;  Laterality: N/A;  . PERCUTANEOUS CORONARY STENT INTERVENTION (PCI-S)  09/26/2014   Procedure: PERCUTANEOUS CORONARY STENT INTERVENTION (PCI-S);  Surgeon: Corky CraftsJayadeep S Varanasi, MD;  [facility];  Service: Cardiovascular;;  . TONSILLECTOMY          Home Medications    Prior to Admission medications   Medication Sig Start Date End Date Taking? Authorizing Provider  aspirin EC 81 MG tablet Take 81 mg by mouth every morning.    Yes [provider]  atorvastatin (LIPITOR) 80 MG tablet Take 1 tablet (80 mg total) by mouth daily at 6 PM. 09/27/14  Yes Azalee CourseMeng, Hao, PA   cetirizine (ZYRTEC) 10 MG tablet Take 10 mg by mouth daily as needed for allergies.   Yes [provider]  dicyclomine (BENTYL) 20 MG tablet Take 1 tablet (20 mg total) by mouth 3 (three) times daily as needed for spasms. 02/21/19  Yes Rancour, Jeannett SeniorStephen, MD  famotidine (PEPCID) 40 MG tablet Take 1 tablet by mouth daily as needed (for acid reflux).    Yes [provider]  gabapentin (NEURONTIN) 300 MG capsule Take 600 mg by mouth every 6 (six) hours.  11/13/13  Yes Philip AspenHernandez Acosta, Limmie PatriciaEstela Y, MD  glipiZIDE (GLUCOTROL) 10 MG tablet Take 5-10 mg by mouth as needed. Take 1 tablet by mouth daily as needed. Can either take 1/2 or 1 tablet depending on sugar   Yes [provider]  lisinopril-hydrochlorothiazide (PRINZIDE,ZESTORETIC) 20-25 MG per tablet Take 1 tablet by mouth daily.   Yes [provider]  LORazepam (ATIVAN) 1 MG tablet Take 1 mg by mouth 3 (three) times daily.   Yes [provider]  metFORMIN (GLUCOPHAGE) 500 MG tablet Take 500 mg by mouth 2 (two) times daily with a meal.   Yes [provider]  methocarbamol (ROBAXIN) 500 MG tablet Take 1 tablet (500 mg total) by mouth every 8 (eight) hours as needed for muscle spasms. 11/30/18  Yes Jacalyn LefevreHaviland, Julie, MD  metoprolol tartrate (LOPRESSOR) 25 MG tablet Take 1 tablet (25 mg total) by mouth 2 (two) times daily. 09/27/14  Yes Azalee CourseMeng, Hao, PA  nitroGLYCERIN (NITROSTAT) 0.4 MG SL tablet Place 1 tablet (0.4 mg total) under the tongue every 5 (five) minutes as needed for chest pain. 01/29/14  Yes Jodelle GrossLawrence, Kathryn M, NP  ondansetron (ZOFRAN ODT) 4 MG disintegrating tablet Take 1 tablet (4 mg total) by mouth every 8 (eight) hours as needed for nausea or vomiting. 02/21/19  Yes Rancour, Jeannett SeniorStephen, MD  Oxycodone HCl 10 MG TABS Take 10 mg by mouth every 4 (four) hours as needed (for pain). Take 1 tablet by mouth every 4-6 hours as needed for pain   Yes [provider]  pantoprazole (PROTONIX) 40 MG tablet Take  40 mg by mouth daily as needed (acid reflux).    Yes [provider]  hydrOXYzine (ATARAX/VISTARIL) 25 MG tablet Take one at night to help with sleep 02/26/19   Bethann BerkshireZammit, Maripaz Mullan, MD  polyethylene glycol-electrolytes (NULYTELY/GOLYTELY) 420 G solution Take 4,000 mLs by mouth once. Patient not taking: Reported on 01/02/2019 01/19/15   Len BlalockSetzer, Terri L, NP    Family History Family History  Problem Relation Age of Onset  . Hypertension Father     Social History Social History   Tobacco Use  . Smoking status: Current Every Day Smoker    Packs/day: 0.50    Types: Cigarettes    Start date: 07/24/1977  . Smokeless tobacco: Former Engineer, waterUser  Substance Use Topics  . Alcohol use: No    Alcohol/week: 0.0 standard drinks  .  Drug use: No     Allergies   Patient has no known allergies.   Review of Systems Review of Systems  Constitutional: Negative for appetite change and fatigue.  HENT: Negative for congestion, ear discharge and sinus pressure.   Eyes: Negative for discharge.  Respiratory: Negative for cough.   Cardiovascular: Negative for chest pain.  Gastrointestinal: Positive for abdominal pain. Negative for diarrhea.  Genitourinary: Negative for frequency and hematuria.  Musculoskeletal: Negative for back pain.  Skin: Negative for rash.  Neurological: Negative for seizures and headaches.  Psychiatric/Behavioral: Negative for hallucinations.     Physical Exam Updated Vital Signs BP (!) 149/79 (BP Location: Left Arm)   Pulse (!) 59   Temp 98 F (36.7 C) (Oral)   Resp 18   Ht 6\' 3"  (1.905 m)   Wt 95.3 kg   SpO2 100%   BMI 26.25 kg/m   Physical Exam Vitals signs and nursing note reviewed.  Constitutional:      Appearance: He is well-developed.  HENT:     Head: Normocephalic.     Nose: Nose normal.  Eyes:     General: No scleral icterus.    Conjunctiva/sclera: Conjunctivae normal.  Neck:     Musculoskeletal: Neck supple.     Thyroid: No thyromegaly.   Cardiovascular:     Rate and Rhythm: Normal rate and regular rhythm.     Heart sounds: No murmur. No friction rub. No gallop.   Pulmonary:     Breath sounds: No stridor. No wheezing or rales.  Chest:     Chest wall: No tenderness.  Abdominal:     General: There is no distension.     Tenderness: There is abdominal tenderness. There is no rebound.     Comments: Minor suprapubic tenderness  Musculoskeletal: Normal range of motion.  Lymphadenopathy:     Cervical: No cervical adenopathy.  Skin:    Findings: No erythema or rash.  Neurological:     Mental Status: He is oriented to person, place, and time.     Motor: No abnormal muscle tone.     Coordination: Coordination normal.  Psychiatric:        Behavior: Behavior normal.      ED Treatments / Results  Labs (all labs ordered are listed, but only abnormal results are displayed) Labs Reviewed  URINALYSIS, ROUTINE W REFLEX MICROSCOPIC - Abnormal; Notable for the following components:      Result Value   Specific Gravity, Urine 1.002 (*)    Glucose, UA >=500 (*)    All other components within normal limits  CBG MONITORING, ED - Abnormal; Notable for the following components:   Glucose-Capillary 116 (*)    All other components within normal limits    EKG None  Radiology No results found.  Procedures Procedures (including critical care time)  Medications Ordered in ED Medications  hydrOXYzine (ATARAX/VISTARIL) tablet 25 mg (has no administration in time range)     Initial Impression / Assessment and Plan / ED Course  I have reviewed the triage vital signs and the nursing notes.  Pertinent labs & imaging results that were available during my care of the patient were reviewed by me and considered in my medical decision making (see chart for details).       Patient given Vistaril and will follow-up with GI and urology   Final Clinical Impressions(s) / ED Diagnoses   Final diagnoses:  Lower abdominal pain     ED Discharge Orders  Ordered    hydrOXYzine (ATARAX/VISTARIL) 25 MG tablet     02/26/19 2257           Bethann BerkshireZammit, Cloys Vera, MD 02/28/19 1015

## 2019-03-05 ENCOUNTER — Emergency Department (HOSPITAL_COMMUNITY)
Admission: EM | Admit: 2019-03-05 | Discharge: 2019-03-05 | Disposition: A | Discharge status: Home / Self Care | Payer: Medicaid Other | Attending: Emergency Medicine | Admitting: Emergency Medicine

## 2019-03-05 ENCOUNTER — Other Ambulatory Visit: Payer: Self-pay

## 2019-03-05 ENCOUNTER — Encounter (HOSPITAL_COMMUNITY): Payer: Self-pay | Admitting: Emergency Medicine

## 2019-03-05 DIAGNOSIS — I10 Essential (primary) hypertension: Secondary | ICD-10-CM | POA: Insufficient documentation

## 2019-03-05 DIAGNOSIS — Z7982 Long term (current) use of aspirin: Secondary | ICD-10-CM | POA: Insufficient documentation

## 2019-03-05 DIAGNOSIS — E119 Type 2 diabetes mellitus without complications: Secondary | ICD-10-CM | POA: Insufficient documentation

## 2019-03-05 DIAGNOSIS — F1110 Opioid abuse, uncomplicated: Secondary | ICD-10-CM | POA: Insufficient documentation

## 2019-03-05 DIAGNOSIS — I252 Old myocardial infarction: Secondary | ICD-10-CM | POA: Insufficient documentation

## 2019-03-05 DIAGNOSIS — Z7984 Long term (current) use of oral hypoglycemic drugs: Secondary | ICD-10-CM | POA: Insufficient documentation

## 2019-03-05 DIAGNOSIS — F17210 Nicotine dependence, cigarettes, uncomplicated: Secondary | ICD-10-CM | POA: Insufficient documentation

## 2019-03-05 DIAGNOSIS — Z79899 Other long term (current) drug therapy: Secondary | ICD-10-CM | POA: Insufficient documentation

## 2019-03-05 DIAGNOSIS — F329 Major depressive disorder, single episode, unspecified: Secondary | ICD-10-CM | POA: Diagnosis not present

## 2019-03-05 DIAGNOSIS — I2510 Atherosclerotic heart disease of native coronary artery without angina pectoris: Secondary | ICD-10-CM | POA: Insufficient documentation

## 2019-03-05 DIAGNOSIS — F1910 Other psychoactive substance abuse, uncomplicated: Secondary | ICD-10-CM

## 2019-03-05 LAB — RAPID URINE DRUG SCREEN, HOSP PERFORMED
Amphetamines: NOT DETECTED
Barbiturates: NOT DETECTED
Benzodiazepines: NOT DETECTED
Cocaine: NOT DETECTED
Opiates: NOT DETECTED
Tetrahydrocannabinol: NOT DETECTED

## 2019-03-05 LAB — CBC WITH DIFFERENTIAL/PLATELET
Abs Immature Granulocytes: 0.02 K/uL (ref 0.00–0.07)
Basophils Absolute: 0.1 K/uL (ref 0.0–0.1)
Basophils Relative: 1 %
Eosinophils Absolute: 0.2 K/uL (ref 0.0–0.5)
Eosinophils Relative: 3 %
HCT: 39.6 % (ref 39.0–52.0)
Hemoglobin: 14 g/dL (ref 13.0–17.0)
Immature Granulocytes: 0 %
Lymphocytes Relative: 43 %
Lymphs Abs: 3.7 K/uL (ref 0.7–4.0)
MCH: 32.7 pg (ref 26.0–34.0)
MCHC: 35.4 g/dL (ref 30.0–36.0)
MCV: 92.5 fL (ref 80.0–100.0)
Monocytes Absolute: 0.8 K/uL (ref 0.1–1.0)
Monocytes Relative: 9 %
Neutro Abs: 3.8 K/uL (ref 1.7–7.7)
Neutrophils Relative %: 44 %
Platelets: 190 K/uL (ref 150–400)
RBC: 4.28 MIL/uL (ref 4.22–5.81)
RDW: 12.9 % (ref 11.5–15.5)
WBC: 8.6 K/uL (ref 4.0–10.5)
nRBC: 0 % (ref 0.0–0.2)

## 2019-03-05 LAB — COMPREHENSIVE METABOLIC PANEL WITH GFR
ALT: 15 U/L (ref 0–44)
AST: 15 U/L (ref 15–41)
Albumin: 4.1 g/dL (ref 3.5–5.0)
Alkaline Phosphatase: 79 U/L (ref 38–126)
Anion gap: 11 (ref 5–15)
BUN: 7 mg/dL (ref 6–20)
CO2: 23 mmol/L (ref 22–32)
Calcium: 9.1 mg/dL (ref 8.9–10.3)
Chloride: 103 mmol/L (ref 98–111)
Creatinine, Ser: 0.79 mg/dL (ref 0.61–1.24)
GFR calc Af Amer: >60 mL/min
GFR calc non Af Amer: >60 mL/min
Glucose, Bld: 147 mg/dL — ABNORMAL HIGH (ref 70–99)
Potassium: 3.4 mmol/L — ABNORMAL LOW (ref 3.5–5.1)
Sodium: 137 mmol/L (ref 135–145)
Total Bilirubin: 0.5 mg/dL (ref 0.3–1.2)
Total Protein: 6.9 g/dL (ref 6.5–8.1)

## 2019-03-05 LAB — ETHANOL: Alcohol, Ethyl (B): <10 mg/dL (ref <10)

## 2019-03-05 MED ORDER — HYDROXYZINE HCL 25 MG PO TABS: ORAL_TABLET | ORAL | 0 refills | Status: AC

## 2019-03-05 MED ORDER — HYDROXYZINE HCL 50 MG/ML IM SOLN
50.0000 mg | Freq: Once | INTRAMUSCULAR | Status: AC
  Administered 2019-03-05: 50 mg via INTRAMUSCULAR
  Filled 2019-03-05: qty 1

## 2019-03-05 MED ORDER — HYDROXYZINE HCL 25 MG PO TABS: ORAL_TABLET | ORAL | 0 refills | Status: DC

## 2019-03-05 MED ORDER — IBUPROFEN 800 MG PO TABS: 800.0000 mg | ORAL_TABLET | Freq: Three times a day (TID) | ORAL | 0 refills | Status: AC | PRN

## 2019-03-05 NOTE — ED Notes (Signed)
EDP went in to talk with pt about discharge per [facility]. Pt states, "I will kill myself if I go home". EDP wants another TTS assessment for SI and to give Vistaril 50mg  IM. Orders placed.

## 2019-03-05 NOTE — BH Assessment (Signed)
Tele Assessment Note   Patient Name: Clayton Foster MRN: [id number] Referring Physician: Sharmon Leyden, MD Location of Patient: [facility] Location of Provider: [place] is a 58 y.o. male who presented to [facility] on a voluntary basis with expressed desire to stop using Oxycodone.  Pt lives in [place] with his mother, and he is on disability (per his report).  Pt has been assessed twice by TTS -- once in May, and once on February 21, 2019.  In both cases, Pt stated that he wants help in coming off of Oxycodone.  In both cases, Pt was discharged with outpatient resources.  Pt stated that he wants help coming off of Oxycodone.  He reported that he feels terrible physically, that he cannot sleep, and that the Oxycodone is making him ''feel worse.''  He stated that he began using Oxycodone in 2018 after back surgery.  Pt endorsed despondency, insomnia, loss of appetite with accompanying weight loss, tearfulness, and irritability.  Pt denied suicidal ideation, but he expressed passive suicidal ideation -- "I wish I were dead sometimes.''  When asked about following-up with outpatient resources, Pt stated that he does not believe outpatient will help.  During assessment, Pt presented as alert and oriented.  He had good eye contact and was cooperative.  Pt was gowned and appeared appropriately groomed.  Pt's mood was helpful.  Affect was preoccupied and anxious.  Pt's speech was normal in rate, rhythm, and volume.  Thought processes were within normal range, and thought content was logical and goal-oriented.  There was no evidence of delusion or hallucination.  Pt's memory and concentration were intact.  Insight was poor.  Judgment and impulse control were fair.  Consulted with A. Nwoko, NP, who determined that Pt does not meet inpatient criteria.  Diagnosis: Anxiety Disorder, Unspec  Past Medical History:  Past Medical History:  Diagnosis Date  . Anxiety   . Arthritis    . Chronic back pain   . Chronic back pain   . Chronic neck pain   . Coronary artery disease    a. 2000: s/p PCI/stenting of prox LAD and RCA @ [facility];  b. 09/2012 Cath/PCI: LM 30d, LAD 40p ISR, 68m, 73m/d (2.25x20 Promus Premier DES), LCX 30p, RCA 80p (3.0x24 Promus Premier DES), patent mid stent, 29m, PDA 86m (2.75x28 Promus Premier DES), EF 60-65%. c. cath 09/26/2014 EF 65%, patent LAD stents, DES 2.5 x 12 Synergy to mid LAD, 80% RCA stenosis treated with balloon angio  . Essential hypertension   . GERD (gastroesophageal reflux disease)   . Headache   . Hyperlipidemia   . Myocardial infarction ([place])   . Neuromuscular disorder ([place])   . Postlaminectomy syndrome   . Type 2 diabetes mellitus ([place])     Past Surgical History:  Procedure Laterality Date  . APPENDECTOMY    . BACK SURGERY    . CARDIAC CATHETERIZATION    . CORONARY ANGIOPLASTY    . LEFT HEART CATHETERIZATION WITH CORONARY ANGIOGRAM N/A 10/11/2012   Procedure: LEFT HEART CATHETERIZATION WITH CORONARY ANGIOGRAM;  Surgeon: Larey Dresser, MD;  Location: [facility] CATH LAB;  Service: Cardiovascular;  Laterality: N/A;  . LEFT HEART CATHETERIZATION WITH CORONARY ANGIOGRAM N/A 09/26/2014   Procedure: LEFT HEART CATHETERIZATION WITH CORONARY ANGIOGRAM;  Surgeon: Jettie Booze, MD;  Location: [facility] CATH LAB;  Service: Cardiovascular;  Laterality: N/A;  . MULTIPLE EXTRACTIONS WITH ALVEOLOPLASTY N/A 01/22/2015   Procedure: MULTIPLE EXTRACTIONS # 2, 4, 5, 6, 7, 10,  11, 12, 13, 15, 20, 21, 22, 23, 24, 25, 26, 27, 28, 31, and Alveloplasty;  Surgeon: Ocie DoyneScott Jensen, DDS;  Location: [facility] OR;  Service: Oral Surgery;  Laterality: N/A;  . PERCUTANEOUS CORONARY STENT INTERVENTION (PCI-S) N/A 10/14/2012   Procedure: PERCUTANEOUS CORONARY STENT INTERVENTION (PCI-S);  Surgeon: Tonny BollmanMichael Cooper, MD;  [facility];  Service: Cardiovascular;  Laterality: N/A;  . PERCUTANEOUS CORONARY STENT INTERVENTION (PCI-S)  09/26/2014   Procedure: PERCUTANEOUS CORONARY STENT  INTERVENTION (PCI-S);  Surgeon: Corky CraftsJayadeep S Varanasi, MD;  [facility];  Service: Cardiovascular;;  . TONSILLECTOMY      Family History:  Family History  Problem Relation Age of Onset  . Hypertension Father     Social History:  reports that he has been smoking cigarettes. He started smoking about 41 years ago. He has been smoking about 0.50 packs per day. He has quit using smokeless tobacco. He reports that he does not drink alcohol or use drugs.  Additional Social History:  Alcohol / Drug Use Pain Medications: See MAr Prescriptions: See MAR Over the Counter: See MAR History of alcohol / drug use?: No history of alcohol / drug abuse  CIWA: CIWA-Ar BP: (!) 148/82 Pulse Rate: (!) 49 COWS:    Allergies: No Known Allergies  Home Medications: (Not in a hospital admission)   OB/GYN Status:  No LMP for male patient.  General Assessment Data Location of Assessment: [facility] TTS Assessment: In system Is this a Tele or Face-to-Face Assessment?: Tele Assessment Is this an Initial Assessment or a Re-assessment for this encounter?: Initial Assessment Patient Accompanied by:: N/A Living Arrangements: Other (Comment) What gender do you identify as?: Male Marital status: Divorced Pregnancy Status: No Living Arrangements: Parent Can pt return to current living arrangement?: Yes Admission Status: Voluntary Is patient capable of signing voluntary admission?: Yes Referral Source: Self/Family/Friend Insurance type: [place] MCD     Crisis Care Plan Living Arrangements: Parent Name of Psychiatrist: none Name of Therapist: none  Education Status Is patient currently in school?: No Is the patient employed, unemployed or receiving disability?: Receiving disability income  Risk to self with the past 6 months Suicidal Ideation: No Has patient been a risk to self within the past 6 months prior to admission? : No Suicidal Intent: No Has patient had any suicidal intent within the past  6 months prior to admission? : No Is patient at risk for suicide?: No Suicidal Plan?: No Has patient had any suicidal plan within the past 6 months prior to admission? : No Access to Means: No What has been your use of drugs/alcohol within the last 12 months?: Denied Previous Attempts/Gestures: No Triggers for Past Attempts: None known Intentional Self Injurious Behavior: None Family Suicide History: No Recent stressful life event(s): Recent negative physical changes Persecutory voices/beliefs?: No Depression: Yes Depression Symptoms: Insomnia, Tearfulness, Loss of interest in usual pleasures Substance abuse history and/or treatment for substance abuse?: Yes Suicide prevention information given to non-admitted patients: Not applicable  Risk to Others within the past 6 months Homicidal Ideation: No Does patient have any lifetime risk of violence toward others beyond the six months prior to admission? : No Thoughts of Harm to Others: No Current Homicidal Intent: No Current Homicidal Plan: No Access to Homicidal Means: No History of harm to others?: No Assessment of Violence: None Noted Does patient have access to weapons?: No Criminal Charges Pending?: No Does patient have a court date: No Is patient on probation?: No  Psychosis Hallucinations: None noted  Delusions: None noted  Mental Status Report Appearance/Hygiene: Unremarkable Eye Contact: Fair Motor Activity: Freedom of movement, Unremarkable Speech: Logical/coherent Level of Consciousness: Alert Mood: Helpless, Preoccupied Affect: Anxious Anxiety Level: Moderate Thought Processes: Coherent, Relevant Judgement: Partial Orientation: Person, Place, Time, Situation Obsessive Compulsive Thoughts/Behaviors: None  Cognitive Functioning Concentration: Normal Memory: Recent Intact, Remote Intact Is patient IDD: No Insight: Poor Impulse Control: Good Appetite: Poor Have you had any weight changes? : Loss Amount of  the weight change? (lbs): 30 lbs Sleep: Decreased Total Hours of Sleep: 3  ADLScreening [facility] Assessment Services) Patient's cognitive ability adequate to safely complete daily activities?: Yes Patient able to express need for assistance with ADLs?: Yes Independently performs ADLs?: Yes (appropriate for developmental age)  Prior Inpatient Therapy Prior Inpatient Therapy: No  Prior Outpatient Therapy Prior Outpatient Therapy: Yes Prior Therapy Dates: 30 years ago Prior Therapy Facilty/Provider(s): unable to recall Reason for Treatment: anxiety  Does patient have an ACCT team?: No Does patient have Intensive In-House Services?  : No Does patient have [facility] services? : No Does patient have P4CC services?: No  ADL Screening (condition at time of admission) Patient's cognitive ability adequate to safely complete daily activities?: Yes Is the patient deaf or have difficulty hearing?: No Does the patient have difficulty seeing, even when wearing glasses/contacts?: No Does the patient have difficulty concentrating, remembering, or making decisions?: No Patient able to express need for assistance with ADLs?: Yes Does the patient have difficulty dressing or bathing?: No Independently performs ADLs?: Yes (appropriate for developmental age) Does the patient have difficulty walking or climbing stairs?: No Weakness of Legs: None Weakness of Arms/Hands: None  Home Assistive Devices/Equipment Home Assistive Devices/Equipment: None  Therapy Consults (therapy consults require a physician order) PT Evaluation Needed: No OT Evalulation Needed: No SLP Evaluation Needed: No Abuse/Neglect Assessment (Assessment to be complete while patient is alone) Abuse/Neglect Assessment Can Be Completed: Yes Physical Abuse: Denies Verbal Abuse: Denies Sexual Abuse: Denies Exploitation of patient/patient's resources: Denies Self-Neglect: Denies Values / Beliefs Cultural Requests During Hospitalization:  None Spiritual Requests During Hospitalization: None Consults Spiritual Care Consult Needed: No Social Work Consult Needed: No Merchant navy officerAdvance Directives (For Healthcare) Does Patient Have a Medical Advance Directive?: No Would patient like information on creating a medical advance directive?: No - Patient declined          Disposition:  Disposition Initial Assessment Completed for this Encounter: Yes Disposition of Patient: Discharge Mode of transportation if patient is discharged/movement?: Other (comment) Patient referred to: ([facility] Resources)  This service was provided via telemedicine using a 2-way, interactive audio and Immunologistvideo technology.  Names of all persons participating in this telemedicine service and their role in this encounter. Name: Selena Lesserichard Licht Role: Pt             Earline Mayotteugene T Revonda Menter 03/05/2019 9:56 AM

## 2019-03-05 NOTE — Progress Notes (Signed)
Patient is psychiatrically cleared.   Per Agustina Caroli, NP that patient does not meet criteria for inpatient hospitalization at this time.   Disposition CSW will fax additional resources to [facility] at [place], RN notified.    Radonna Ricker, MSW, [place] Social Worker [facility]  Phone: [phone]

## 2019-03-05 NOTE — ED Provider Notes (Signed)
[facility] Provider Note   CSN: [id number] Arrival date & time: 03/05/19  0701     History   Chief Complaint Chief Complaint  Patient presents with  . Opioid Problem    HPI Clayton Foster is a 58 y.o. male.     Patient complains of depression and anxiety since stopping his narcotic  The history is provided by the patient. No language interpreter was used.  Altered Mental Status Presenting symptoms: behavior changes   Severity:  Moderate Most recent episode:  Today Episode history:  Multiple Timing:  Constant Progression:  Worsening Chronicity:  New Context: not alcohol use   Associated symptoms: no abdominal pain, no hallucinations, no headaches, no rash and no seizures     Past Medical History:  Diagnosis Date  . Anxiety   . Arthritis   . Chronic back pain   . Chronic back pain   . Chronic neck pain   . Coronary artery disease    a. 2000: s/p PCI/stenting of prox LAD and RCA @ [facility];  b. 09/2012 Cath/PCI: LM 30d, LAD 40p ISR, 2575m, 6546m/d (2.25x20 Promus Premier DES), LCX 30p, RCA 80p (3.0x24 Promus Premier DES), patent mid stent, 4875m, PDA 5198m (2.75x28 Promus Premier DES), EF 60-65%. c. cath 09/26/2014 EF 65%, patent LAD stents, DES 2.5 x 12 Synergy to mid LAD, 80% RCA stenosis treated with balloon angio  . Essential hypertension   . GERD (gastroesophageal reflux disease)   . Headache   . Hyperlipidemia   . Myocardial infarction (HCC)   . Neuromuscular disorder ([facility])   . Postlaminectomy syndrome   . Type 2 diabetes mellitus [facility](HCC)     Patient Active Problem List   Diagnosis Date Noted  . Anxiety 10/08/2017  . Arthritis 10/08/2017  . Depression 10/08/2017  . NSTEMI (non-ST elevated myocardial infarction) (HCC) 09/26/2014  . Hyponatremia 10/10/2012  . Chest pain 07/22/2012  . CAD (coronary artery disease) 07/22/2012  . Panic disorder 07/22/2012  . Tobacco use 07/22/2012  . GERD (gastroesophageal reflux disease) 07/22/2012  . Diabetes  mellitus, type 2 (HCC) 09/28/2011  . Hypertension 09/28/2011  . High cholesterol 09/28/2011  . Back pain with radiculopathy 09/28/2011    Past Surgical History:  Procedure Laterality Date  . APPENDECTOMY    . BACK SURGERY    . CARDIAC CATHETERIZATION    . CORONARY ANGIOPLASTY    . LEFT HEART CATHETERIZATION WITH CORONARY ANGIOGRAM N/A 10/11/2012   Procedure: LEFT HEART CATHETERIZATION WITH CORONARY ANGIOGRAM;  Surgeon: Laurey Moralealton S McLean, MD;  [facility];  Service: Cardiovascular;  Laterality: N/A;  . LEFT HEART CATHETERIZATION WITH CORONARY ANGIOGRAM N/A 09/26/2014   Procedure: LEFT HEART CATHETERIZATION WITH CORONARY ANGIOGRAM;  Surgeon: Corky CraftsJayadeep S Varanasi, MD;  [facility];  Service: Cardiovascular;  Laterality: N/A;  . MULTIPLE EXTRACTIONS WITH ALVEOLOPLASTY N/A 01/22/2015   Procedure: MULTIPLE EXTRACTIONS # 2, 4, 5, 6, 7, 10, 11, 12, 13, 15, 20, 21, 22, 23, 24, 25, 26, 27, 28, 31, and Alveloplasty;  Surgeon: Ocie DoyneScott Jensen, DDS;  Location: [facility] OR;  Service: Oral Surgery;  Laterality: N/A;  . PERCUTANEOUS CORONARY STENT INTERVENTION (PCI-S) N/A 10/14/2012   Procedure: PERCUTANEOUS CORONARY STENT INTERVENTION (PCI-S);  Surgeon: Tonny BollmanMichael Cooper, MD;  [facility];  Service: Cardiovascular;  Laterality: N/A;  . PERCUTANEOUS CORONARY STENT INTERVENTION (PCI-S)  09/26/2014   Procedure: PERCUTANEOUS CORONARY STENT INTERVENTION (PCI-S);  Surgeon: Corky CraftsJayadeep S Varanasi, MD;  [facility];  Service: Cardiovascular;;  . TONSILLECTOMY  Home Medications    Prior to Admission medications   Medication Sig Start Date End Date Taking? Authorizing Provider  aspirin EC 81 MG tablet Take 81 mg by mouth every morning.     [provider]  atorvastatin (LIPITOR) 80 MG tablet Take 1 tablet (80 mg total) by mouth daily at 6 PM. 09/27/14   Almyra Deforest, PA  cetirizine (ZYRTEC) 10 MG tablet Take 10 mg by mouth daily as needed for allergies.    [provider]   dicyclomine (BENTYL) 20 MG tablet Take 1 tablet (20 mg total) by mouth 3 (three) times daily as needed for spasms. 02/21/19   Rancour, Annie Main, MD  famotidine (PEPCID) 40 MG tablet Take 1 tablet by mouth daily as needed (for acid reflux).     [provider]  gabapentin (NEURONTIN) 300 MG capsule Take 600 mg by mouth every 6 (six) hours.  11/13/13   Isaac Bliss, Rayford Halsted, MD  glipiZIDE (GLUCOTROL) 10 MG tablet Take 5-10 mg by mouth as needed. Take 1 tablet by mouth daily as needed. Can either take 1/2 or 1 tablet depending on sugar    [provider]  hydrOXYzine (ATARAX/VISTARIL) 25 MG tablet Take one at night to help with sleep 02/26/19   Milton Ferguson, MD  lisinopril-hydrochlorothiazide (PRINZIDE,ZESTORETIC) 20-25 MG per tablet Take 1 tablet by mouth daily.    [provider]  LORazepam (ATIVAN) 1 MG tablet Take 1 mg by mouth 3 (three) times daily.    [provider]  metFORMIN (GLUCOPHAGE) 500 MG tablet Take 500 mg by mouth 2 (two) times daily with a meal.    [provider]  methocarbamol (ROBAXIN) 500 MG tablet Take 1 tablet (500 mg total) by mouth every 8 (eight) hours as needed for muscle spasms. 11/30/18   Isla Pence, MD  metoprolol tartrate (LOPRESSOR) 25 MG tablet Take 1 tablet (25 mg total) by mouth 2 (two) times daily. 09/27/14   Almyra Deforest, PA  nitroGLYCERIN (NITROSTAT) 0.4 MG SL tablet Place 1 tablet (0.4 mg total) under the tongue every 5 (five) minutes as needed for chest pain. 01/29/14   Lendon Colonel, NP  ondansetron (ZOFRAN ODT) 4 MG disintegrating tablet Take 1 tablet (4 mg total) by mouth every 8 (eight) hours as needed for nausea or vomiting. 02/21/19   Rancour, Annie Main, MD  Oxycodone HCl 10 MG TABS Take 10 mg by mouth every 4 (four) hours as needed (for pain). Take 1 tablet by mouth every 4-6 hours as needed for pain    [provider]  pantoprazole (PROTONIX) 40 MG tablet Take 40 mg by mouth daily as needed (acid  reflux).     [provider]  polyethylene glycol-electrolytes (NULYTELY/GOLYTELY) 420 G solution Take 4,000 mLs by mouth once. Patient not taking: Reported on 01/02/2019 01/19/15   Butch Penny, NP    Family History Family History  Problem Relation Age of Onset  . Hypertension Father     Social History Social History   Tobacco Use  . Smoking status: Current Every Day Smoker    Packs/day: 0.50    Types: Cigarettes    Start date: 07/24/1977  . Smokeless tobacco: Former Network engineer Use Topics  . Alcohol use: No    Alcohol/week: 0.0 standard drinks  . Drug use: No     Allergies   Patient has no known allergies.   Review of Systems Review of Systems  Constitutional: Negative for appetite change and fatigue.  HENT: Negative  for congestion, ear discharge and sinus pressure.   Eyes: Negative for discharge.  Respiratory: Negative for cough.   Cardiovascular: Negative for chest pain.  Gastrointestinal: Negative for abdominal pain and diarrhea.  Genitourinary: Negative for frequency and hematuria.  Musculoskeletal: Negative for back pain.  Skin: Negative for rash.  Neurological: Negative for seizures and headaches.  Psychiatric/Behavioral: Negative for hallucinations.       Narcotic abuse and depression     Physical Exam Updated Vital Signs BP (!) 175/94 (BP Location: Right Arm)   Pulse 62   Temp 97.7 F (36.5 C) (Oral)   Resp 16   Ht 6\' 3"  (1.905 m)   Wt 95.3 kg   SpO2 100%   BMI 26.25 kg/m   Physical Exam Vitals signs and nursing note reviewed.  Constitutional:      Appearance: He is well-developed.  HENT:     Head: Normocephalic.     Right Ear: Tympanic membrane normal.     Mouth/Throat:     Mouth: Mucous membranes are moist.  Eyes:     General: No scleral icterus.    Conjunctiva/sclera: Conjunctivae normal.  Neck:     Musculoskeletal: Neck supple.     Thyroid: No thyromegaly.  Cardiovascular:     Rate and Rhythm: Normal rate and  regular rhythm.     Heart sounds: No murmur. No friction rub. No gallop.   Pulmonary:     Breath sounds: No stridor. No wheezing or rales.  Chest:     Chest wall: No tenderness.  Abdominal:     General: There is no distension.     Tenderness: There is no abdominal tenderness. There is no rebound.  Musculoskeletal: Normal range of motion.  Lymphadenopathy:     Cervical: No cervical adenopathy.  Skin:    Findings: No erythema or rash.  Neurological:     Mental Status: He is alert and oriented to person, place, and time.     Motor: No abnormal muscle tone.     Coordination: Coordination normal.  Psychiatric:     Comments:   Depressed but not suicidal      ED Treatments / Results  Labs (all labs ordered are listed, but only abnormal results are displayed) Labs Reviewed - No data to display  EKG None  Radiology No results found.  Procedures Procedures (including critical care time)  Medications Ordered in ED Medications - No data to display   Initial Impression / Assessment and Plan / ED Course  I have reviewed the triage vital signs and the nursing notes.  Pertinent labs & imaging results that were available during my care of the patient were reviewed by me and considered in my medical decision making (see chart for details).        Patient no longer suicidal.  He wants to go home.  He will be sent home with Vistaril and Motrin and [facility]  Final Clinical Impressions(s) / ED Diagnoses   Final diagnoses:  None    ED Discharge Orders    None       Bethann BerkshireZammit, Kari Kerth, MD 03/11/19 1321

## 2019-03-05 NOTE — Discharge Instructions (Signed)
Follow-up with [facility] this week ?

## 2019-03-05 NOTE — ED Triage Notes (Signed)
PT states he has been cutting back on his oxycodone dosage at home and c/o withdrawal symptoms with pain/twitching in legs and difficulty sleeping.

## 2019-03-05 NOTE — ED Notes (Signed)
Pt changed into paper scrubs. Clothing and cell phone placed in locker. [employer] set up to monitor pt.

## 2019-03-05 NOTE — ED Notes (Signed)
Pt refuses to sign for discharge. Pt states, "I don't feel like I've received any services here today".

## 2019-03-05 NOTE — ED Notes (Signed)
Pt reports he has been suicidal for a couple of days. Went asked if pt has a plan, pt states, "I don't know. I just don't know." Pt becoming agitated while talking about his reports of being SI. RN used successful with using verbal deescalation with pt.

## 2019-03-27 ENCOUNTER — Encounter (HOSPITAL_COMMUNITY): Payer: Self-pay

## 2019-03-27 ENCOUNTER — Ambulatory Visit (HOSPITAL_COMMUNITY): Payer: Medicaid Other

## 2019-03-27 ENCOUNTER — Telehealth (HOSPITAL_COMMUNITY): Payer: Self-pay | Admitting: Emergency Medicine

## 2019-03-27 NOTE — Telephone Encounter (Signed)
03/27/19  pt was here at 730 this morning... he said he was told to be here then... he couldn't come to the 245 appt because he had a drs appt already scheduled... he was RS for next wee

## 2019-04-03 ENCOUNTER — Telehealth (HOSPITAL_COMMUNITY): Payer: Self-pay | Admitting: Emergency Medicine

## 2019-04-03 ENCOUNTER — Ambulatory Visit (HOSPITAL_COMMUNITY): Payer: Medicaid Other | Admitting: Physical Therapy

## 2019-04-03 NOTE — Telephone Encounter (Signed)
pt called to [facility] said that he is supposed to be getting a CAT scan and will call back once he is ready to do therapy  04/03/19

## 2019-04-08 ENCOUNTER — Other Ambulatory Visit: Payer: Self-pay | Admitting: Orthopaedic Surgery

## 2019-04-08 ENCOUNTER — Telehealth: Payer: Self-pay | Admitting: Nurse Practitioner

## 2019-04-08 DIAGNOSIS — M4326 Fusion of spine, lumbar region: Secondary | ICD-10-CM

## 2019-04-08 NOTE — Telephone Encounter (Signed)
Phone call to patient to verify medication list and allergies for myelogram procedure. Pt aware he will not need to hold any medications for this procedure. Pre and post procedure instructions reviewed with pt. 

## 2019-04-17 ENCOUNTER — Ambulatory Visit
Admission: RE | Admit: 2019-04-17 | Discharge: 2019-04-17 | Disposition: A | Discharge status: Home / Self Care | Payer: Medicaid Other | Source: Ambulatory Visit | Attending: Orthopaedic Surgery | Admitting: Orthopaedic Surgery

## 2019-04-17 ENCOUNTER — Other Ambulatory Visit: Payer: Self-pay

## 2019-04-17 DIAGNOSIS — M4326 Fusion of spine, lumbar region: Secondary | ICD-10-CM

## 2019-04-17 MED ORDER — IOPAMIDOL (ISOVUE-M 200) INJECTION 41%
15.0000 mL | Freq: Once | INTRAMUSCULAR | Status: AC
  Administered 2019-04-17: 14:00:00 15 mL via INTRATHECAL

## 2019-04-17 MED ORDER — DIAZEPAM 5 MG PO TABS
10.0000 mg | ORAL_TABLET | Freq: Once | ORAL | Status: AC
  Administered 2019-04-17: 13:00:00 5 mg via ORAL

## 2019-04-17 NOTE — Discharge Instructions (Signed)

## 2019-05-01 ENCOUNTER — Ambulatory Visit: Payer: Medicaid Other | Admitting: Nurse Practitioner

## 2019-06-28 ENCOUNTER — Emergency Department (HOSPITAL_COMMUNITY): Payer: Medicaid Other

## 2019-06-28 ENCOUNTER — Inpatient Hospital Stay (HOSPITAL_COMMUNITY)
Admission: EM | Admit: 2019-06-28 | Discharge: 2019-07-25 | DRG: 308 | Disposition: E | Payer: Medicaid Other | Attending: Pulmonary Disease | Admitting: Pulmonary Disease

## 2019-06-28 ENCOUNTER — Encounter (HOSPITAL_COMMUNITY): Payer: Self-pay | Admitting: Anesthesiology

## 2019-06-28 DIAGNOSIS — R579 Shock, unspecified: Secondary | ICD-10-CM | POA: Diagnosis not present

## 2019-06-28 DIAGNOSIS — Z955 Presence of coronary angioplasty implant and graft: Secondary | ICD-10-CM | POA: Diagnosis not present

## 2019-06-28 DIAGNOSIS — K219 Gastro-esophageal reflux disease without esophagitis: Secondary | ICD-10-CM | POA: Diagnosis present

## 2019-06-28 DIAGNOSIS — I2510 Atherosclerotic heart disease of native coronary artery without angina pectoris: Secondary | ICD-10-CM | POA: Diagnosis present

## 2019-06-28 DIAGNOSIS — Z452 Encounter for adjustment and management of vascular access device: Secondary | ICD-10-CM

## 2019-06-28 DIAGNOSIS — I472 Ventricular tachycardia: Secondary | ICD-10-CM | POA: Diagnosis not present

## 2019-06-28 DIAGNOSIS — F329 Major depressive disorder, single episode, unspecified: Secondary | ICD-10-CM | POA: Diagnosis present

## 2019-06-28 DIAGNOSIS — T68XXXA Hypothermia, initial encounter: Secondary | ICD-10-CM | POA: Diagnosis present

## 2019-06-28 DIAGNOSIS — F1320 Sedative, hypnotic or anxiolytic dependence, uncomplicated: Secondary | ICD-10-CM | POA: Diagnosis present

## 2019-06-28 DIAGNOSIS — M549 Dorsalgia, unspecified: Secondary | ICD-10-CM | POA: Diagnosis present

## 2019-06-28 DIAGNOSIS — Z20828 Contact with and (suspected) exposure to other viral communicable diseases: Secondary | ICD-10-CM | POA: Diagnosis present

## 2019-06-28 DIAGNOSIS — E1165 Type 2 diabetes mellitus with hyperglycemia: Secondary | ICD-10-CM | POA: Diagnosis not present

## 2019-06-28 DIAGNOSIS — I4901 Ventricular fibrillation: Principal | ICD-10-CM | POA: Diagnosis present

## 2019-06-28 DIAGNOSIS — Z66 Do not resuscitate: Secondary | ICD-10-CM | POA: Diagnosis not present

## 2019-06-28 DIAGNOSIS — I10 Essential (primary) hypertension: Secondary | ICD-10-CM | POA: Diagnosis present

## 2019-06-28 DIAGNOSIS — G40A01 Absence epileptic syndrome, not intractable, with status epilepticus: Secondary | ICD-10-CM | POA: Diagnosis present

## 2019-06-28 DIAGNOSIS — G40901 Epilepsy, unspecified, not intractable, with status epilepticus: Secondary | ICD-10-CM | POA: Diagnosis not present

## 2019-06-28 DIAGNOSIS — Z515 Encounter for palliative care: Secondary | ICD-10-CM | POA: Diagnosis not present

## 2019-06-28 DIAGNOSIS — G931 Anoxic brain damage, not elsewhere classified: Secondary | ICD-10-CM | POA: Diagnosis present

## 2019-06-28 DIAGNOSIS — F17200 Nicotine dependence, unspecified, uncomplicated: Secondary | ICD-10-CM | POA: Diagnosis present

## 2019-06-28 DIAGNOSIS — E785 Hyperlipidemia, unspecified: Secondary | ICD-10-CM | POA: Diagnosis present

## 2019-06-28 DIAGNOSIS — J9600 Acute respiratory failure, unspecified whether with hypoxia or hypercapnia: Secondary | ICD-10-CM

## 2019-06-28 DIAGNOSIS — R404 Transient alteration of awareness: Secondary | ICD-10-CM | POA: Diagnosis present

## 2019-06-28 DIAGNOSIS — R509 Fever, unspecified: Secondary | ICD-10-CM | POA: Diagnosis not present

## 2019-06-28 DIAGNOSIS — F1120 Opioid dependence, uncomplicated: Secondary | ICD-10-CM | POA: Diagnosis present

## 2019-06-28 DIAGNOSIS — G8929 Other chronic pain: Secondary | ICD-10-CM | POA: Diagnosis present

## 2019-06-28 DIAGNOSIS — I429 Cardiomyopathy, unspecified: Secondary | ICD-10-CM | POA: Diagnosis present

## 2019-06-28 DIAGNOSIS — E8881 Metabolic syndrome: Secondary | ICD-10-CM | POA: Diagnosis present

## 2019-06-28 DIAGNOSIS — I469 Cardiac arrest, cause unspecified: Secondary | ICD-10-CM | POA: Diagnosis present

## 2019-06-28 DIAGNOSIS — M5410 Radiculopathy, site unspecified: Secondary | ICD-10-CM | POA: Diagnosis present

## 2019-06-28 DIAGNOSIS — J9601 Acute respiratory failure with hypoxia: Secondary | ICD-10-CM | POA: Diagnosis present

## 2019-06-28 DIAGNOSIS — E870 Hyperosmolality and hypernatremia: Secondary | ICD-10-CM | POA: Diagnosis not present

## 2019-06-28 DIAGNOSIS — F410 Panic disorder [episodic paroxysmal anxiety] without agoraphobia: Secondary | ICD-10-CM | POA: Diagnosis present

## 2019-06-28 DIAGNOSIS — D696 Thrombocytopenia, unspecified: Secondary | ICD-10-CM | POA: Diagnosis not present

## 2019-06-28 HISTORY — PX: CENTRAL VENOUS CATHETER INSERTION: SHX401

## 2019-06-28 LAB — POCT I-STAT 7, (LYTES, BLD GAS, ICA,H+H)
Acid-base deficit: 5 mmol/L — ABNORMAL HIGH (ref 0.0–2.0)
Acid-base deficit: 3 mmol/L — ABNORMAL HIGH (ref 0.0–2.0)
Bicarbonate: 20.8 mmol/L (ref 20.0–28.0)
Bicarbonate: 20.5 mmol/L (ref 20.0–28.0)
Calcium, Ion: 1.16 mmol/L (ref 1.15–1.40)
Calcium, Ion: 1.15 mmol/L (ref 1.15–1.40)
HCT: 40 % (ref 39.0–52.0)
HCT: 41 % (ref 39.0–52.0)
Hemoglobin: 13.6 g/dL (ref 13.0–17.0)
Hemoglobin: 13.9 g/dL (ref 13.0–17.0)
O2 Saturation: 99 %
O2 Saturation: 100 %
Patient temperature: 96.2
Patient temperature: 34.2
Potassium: 4.1 mmol/L (ref 3.5–5.1)
Potassium: 4.9 mmol/L (ref 3.5–5.1)
Sodium: 137 mmol/L (ref 135–145)
Sodium: 133 mmol/L — ABNORMAL LOW (ref 135–145)
TCO2: 22 mmol/L (ref 22–32)
TCO2: 21 mmol/L — ABNORMAL LOW (ref 22–32)
pCO2 arterial: 39 mmHg (ref 32.0–48.0)
pCO2 arterial: 27.1 mmHg — ABNORMAL LOW (ref 32.0–48.0)
pH, Arterial: 7.329 — ABNORMAL LOW (ref 7.350–7.450)
pH, Arterial: 7.474 — ABNORMAL HIGH (ref 7.350–7.450)
pO2, Arterial: 135 mmHg — ABNORMAL HIGH (ref 83.0–108.0)
pO2, Arterial: 347 mmHg — ABNORMAL HIGH (ref 83.0–108.0)

## 2019-06-28 LAB — APTT
aPTT: 29 seconds (ref 24–36)
aPTT: 29 seconds (ref 24–36)

## 2019-06-28 LAB — CBC WITH DIFFERENTIAL/PLATELET
Abs Immature Granulocytes: 0.38 10*3/uL — ABNORMAL HIGH (ref 0.00–0.07)
Basophils Absolute: 0.1 10*3/uL (ref 0.0–0.1)
Basophils Relative: 1 %
Eosinophils Absolute: 0.1 10*3/uL (ref 0.0–0.5)
Eosinophils Relative: 1 %
HCT: 43.8 % (ref 39.0–52.0)
Hemoglobin: 15 g/dL (ref 13.0–17.0)
Immature Granulocytes: 3 %
Lymphocytes Relative: 45 %
Lymphs Abs: 5.2 10*3/uL — ABNORMAL HIGH (ref 0.7–4.0)
MCH: 32.7 pg (ref 26.0–34.0)
MCHC: 34.2 g/dL (ref 30.0–36.0)
MCV: 95.4 fL (ref 80.0–100.0)
Monocytes Absolute: 0.5 10*3/uL (ref 0.1–1.0)
Monocytes Relative: 5 %
Neutro Abs: 5.3 10*3/uL (ref 1.7–7.7)
Neutrophils Relative %: 45 %
Platelets: 211 10*3/uL (ref 150–400)
RBC: 4.59 MIL/uL (ref 4.22–5.81)
RDW: 12.8 % (ref 11.5–15.5)
WBC: 11.7 10*3/uL — ABNORMAL HIGH (ref 4.0–10.5)
nRBC: 0 % (ref 0.0–0.2)

## 2019-06-28 LAB — GLUCOSE, CAPILLARY
Glucose-Capillary: 157 mg/dL — ABNORMAL HIGH (ref 70–99)
Glucose-Capillary: 186 mg/dL — ABNORMAL HIGH (ref 70–99)
Glucose-Capillary: 180 mg/dL — ABNORMAL HIGH (ref 70–99)
Glucose-Capillary: 189 mg/dL — ABNORMAL HIGH (ref 70–99)
Glucose-Capillary: 175 mg/dL — ABNORMAL HIGH (ref 70–99)

## 2019-06-28 LAB — POCT I-STAT, CHEM 8
BUN: 17 mg/dL (ref 6–20)
BUN: 18 mg/dL (ref 6–20)
Calcium, Ion: 1.16 mmol/L (ref 1.15–1.40)
Calcium, Ion: 1.17 mmol/L (ref 1.15–1.40)
Chloride: 105 mmol/L (ref 98–111)
Chloride: 107 mmol/L (ref 98–111)
Creatinine, Ser: 0.7 mg/dL (ref 0.61–1.24)
Creatinine, Ser: 0.7 mg/dL (ref 0.61–1.24)
Glucose, Bld: 192 mg/dL — ABNORMAL HIGH (ref 70–99)
Glucose, Bld: 199 mg/dL — ABNORMAL HIGH (ref 70–99)
HCT: 42 % (ref 39.0–52.0)
HCT: 47 % (ref 39.0–52.0)
Hemoglobin: 14.3 g/dL (ref 13.0–17.0)
Hemoglobin: 16 g/dL (ref 13.0–17.0)
Potassium: 4.8 mmol/L (ref 3.5–5.1)
Potassium: 4.4 mmol/L (ref 3.5–5.1)
Sodium: 137 mmol/L (ref 135–145)
Sodium: 138 mmol/L (ref 135–145)
TCO2: 24 mmol/L (ref 22–32)
TCO2: 19 mmol/L — ABNORMAL LOW (ref 22–32)

## 2019-06-28 LAB — CBC
HCT: 39.8 % (ref 39.0–52.0)
Hemoglobin: 14.7 g/dL (ref 13.0–17.0)
MCH: 33.5 pg (ref 26.0–34.0)
MCHC: 36.9 g/dL — ABNORMAL HIGH (ref 30.0–36.0)
MCV: 90.7 fL (ref 80.0–100.0)
Platelets: 178 10*3/uL (ref 150–400)
RBC: 4.39 MIL/uL (ref 4.22–5.81)
RDW: 12.6 % (ref 11.5–15.5)
WBC: 12.3 10*3/uL — ABNORMAL HIGH (ref 4.0–10.5)
nRBC: 0 % (ref 0.0–0.2)

## 2019-06-28 LAB — LACTIC ACID, PLASMA: Lactic Acid, Venous: 1.8 mmol/L (ref 0.5–1.9)

## 2019-06-28 LAB — BASIC METABOLIC PANEL
Anion gap: 9 (ref 5–15)
BUN: 15 mg/dL (ref 6–20)
CO2: 18 mmol/L — ABNORMAL LOW (ref 22–32)
Calcium: 8.2 mg/dL — ABNORMAL LOW (ref 8.9–10.3)
Chloride: 106 mmol/L (ref 98–111)
Creatinine, Ser: 0.93 mg/dL (ref 0.61–1.24)
GFR calc Af Amer: >60 mL/min (ref >60)
GFR calc non Af Amer: >60 mL/min (ref >60)
Glucose, Bld: 194 mg/dL — ABNORMAL HIGH (ref 70–99)
Potassium: 4.6 mmol/L (ref 3.5–5.1)
Sodium: 133 mmol/L — ABNORMAL LOW (ref 135–145)

## 2019-06-28 LAB — LIPID PANEL
Cholesterol: 149 mg/dL (ref 0–200)
Cholesterol: 143 mg/dL (ref 0–200)
HDL: 37 mg/dL — ABNORMAL LOW (ref >40)
HDL: 41 mg/dL (ref >40)
LDL Cholesterol: 88 mg/dL (ref 0–99)
LDL Cholesterol: 87 mg/dL (ref 0–99)
Total CHOL/HDL Ratio: 4 RATIO
Total CHOL/HDL Ratio: 3.5 RATIO
Triglycerides: 118 mg/dL (ref <150)
Triglycerides: 76 mg/dL (ref <150)
VLDL: 24 mg/dL (ref 0–40)
VLDL: 15 mg/dL (ref 0–40)

## 2019-06-28 LAB — COMPREHENSIVE METABOLIC PANEL
ALT: 61 U/L — ABNORMAL HIGH (ref 0–44)
AST: 80 U/L — ABNORMAL HIGH (ref 15–41)
Albumin: 3.7 g/dL (ref 3.5–5.0)
Alkaline Phosphatase: 94 U/L (ref 38–126)
Anion gap: 16 — ABNORMAL HIGH (ref 5–15)
BUN: 12 mg/dL (ref 6–20)
CO2: 17 mmol/L — ABNORMAL LOW (ref 22–32)
Calcium: 8.7 mg/dL — ABNORMAL LOW (ref 8.9–10.3)
Chloride: 104 mmol/L (ref 98–111)
Creatinine, Ser: 1.08 mg/dL (ref 0.61–1.24)
GFR calc Af Amer: >60 mL/min (ref >60)
GFR calc non Af Amer: >60 mL/min (ref >60)
Glucose, Bld: 297 mg/dL — ABNORMAL HIGH (ref 70–99)
Potassium: 4.1 mmol/L (ref 3.5–5.1)
Sodium: 137 mmol/L (ref 135–145)
Total Bilirubin: 0.8 mg/dL (ref 0.3–1.2)
Total Protein: 6.2 g/dL — ABNORMAL LOW (ref 6.5–8.1)

## 2019-06-28 LAB — TROPONIN I (HIGH SENSITIVITY)
Troponin I (High Sensitivity): 81 ng/L — ABNORMAL HIGH (ref <18)
Troponin I (High Sensitivity): 6196 ng/L (ref <18)
Troponin I (High Sensitivity): 12349 ng/L (ref <18)

## 2019-06-28 LAB — POC SARS CORONAVIRUS 2 AG -  ED: SARS Coronavirus 2 Ag: NEGATIVE

## 2019-06-28 LAB — SALICYLATE LEVEL: Salicylate Lvl: <7 mg/dL (ref 2.8–30.0)

## 2019-06-28 LAB — PROCALCITONIN: Procalcitonin: <0.1 ng/mL

## 2019-06-28 LAB — PROTIME-INR
INR: 1.2 (ref 0.8–1.2)
INR: 1.2 (ref 0.8–1.2)
Prothrombin Time: 15 seconds (ref 11.4–15.2)
Prothrombin Time: 15.1 seconds (ref 11.4–15.2)

## 2019-06-28 LAB — MRSA PCR SCREENING: MRSA by PCR: NEGATIVE

## 2019-06-28 LAB — ETHANOL: Alcohol, Ethyl (B): <10 mg/dL (ref <10)

## 2019-06-28 LAB — RAPID URINE DRUG SCREEN, HOSP PERFORMED
Amphetamines: NOT DETECTED
Barbiturates: NOT DETECTED
Benzodiazepines: POSITIVE — AB
Cocaine: NOT DETECTED
Opiates: NOT DETECTED
Tetrahydrocannabinol: NOT DETECTED

## 2019-06-28 LAB — SARS CORONAVIRUS 2 BY RT PCR (HOSPITAL ORDER, PERFORMED IN ~~LOC~~ HOSPITAL LAB): SARS Coronavirus 2: NEGATIVE

## 2019-06-28 LAB — TRIGLYCERIDES: Triglycerides: 80 mg/dL (ref <150)

## 2019-06-28 LAB — ACETAMINOPHEN LEVEL: Acetaminophen (Tylenol), Serum: <10 ug/mL — ABNORMAL LOW (ref 10–30)

## 2019-06-28 MED ORDER — PANTOPRAZOLE SODIUM 40 MG IV SOLR
40.0000 mg | Freq: Every day | INTRAVENOUS | Status: DC
  Administered 2019-06-28 – 2019-06-29 (×2): 40 mg via INTRAVENOUS
  Filled 2019-06-28 (×2): qty 40

## 2019-06-28 MED ORDER — INSULIN ASPART 100 UNIT/ML ~~LOC~~ SOLN
2.0000 [IU] | SUBCUTANEOUS | Status: DC
  Administered 2019-06-28 – 2019-06-29 (×5): 4 [IU] via SUBCUTANEOUS
  Administered 2019-06-29: 2 [IU] via SUBCUTANEOUS
  Administered 2019-06-29 – 2019-06-30 (×2): 4 [IU] via SUBCUTANEOUS
  Administered 2019-06-30 (×2): 2 [IU] via SUBCUTANEOUS
  Administered 2019-06-30: 4 [IU] via SUBCUTANEOUS
  Administered 2019-06-30 (×2): 2 [IU] via SUBCUTANEOUS
  Administered 2019-07-01 (×2): 4 [IU] via SUBCUTANEOUS
  Administered 2019-07-01 (×2): 2 [IU] via SUBCUTANEOUS
  Administered 2019-07-01: 12:00:00 4 [IU] via SUBCUTANEOUS
  Administered 2019-07-01: 2 [IU] via SUBCUTANEOUS
  Administered 2019-07-01: 4 [IU] via SUBCUTANEOUS
  Administered 2019-07-02 (×2): 2 [IU] via SUBCUTANEOUS
  Administered 2019-07-02: 4 [IU] via SUBCUTANEOUS
  Administered 2019-07-02 – 2019-07-03 (×3): 2 [IU] via SUBCUTANEOUS
  Administered 2019-07-03 (×4): 4 [IU] via SUBCUTANEOUS
  Administered 2019-07-04 – 2019-07-05 (×7): 2 [IU] via SUBCUTANEOUS

## 2019-06-28 MED ORDER — SODIUM CHLORIDE 0.9 % IV SOLN
INTRAVENOUS | Status: DC
  Administered 2019-06-28 – 2019-06-30 (×3): via INTRAVENOUS
  Administered 2019-07-02: 250 mL via INTRAVENOUS
  Administered 2019-07-03 – 2019-07-04 (×2): via INTRAVENOUS

## 2019-06-28 MED ORDER — SODIUM CHLORIDE 0.9 % IV SOLN
1.0000 ug/kg/min | INTRAVENOUS | Status: DC
  Administered 2019-06-28: 1 ug/kg/min via INTRAVENOUS
  Filled 2019-06-28 (×2): qty 20

## 2019-06-28 MED ORDER — MIDAZOLAM BOLUS VIA INFUSION
2.0000 mg | INTRAVENOUS | Status: DC | PRN
  Administered 2019-06-29: 2 mg via INTRAVENOUS
  Filled 2019-06-28: qty 2

## 2019-06-28 MED ORDER — ETOMIDATE 2 MG/ML IV SOLN
INTRAVENOUS | Status: DC | PRN
  Administered 2019-06-28: 20 mg via INTRAVENOUS

## 2019-06-28 MED ORDER — CHLORHEXIDINE GLUCONATE 0.12% ORAL RINSE (MEDLINE KIT)
15.0000 mL | Freq: Two times a day (BID) | OROMUCOSAL | Status: DC
  Administered 2019-06-28 – 2019-07-05 (×13): 15 mL via OROMUCOSAL

## 2019-06-28 MED ORDER — HEPARIN (PORCINE) 25000 UT/250ML-% IV SOLN
2100.0000 [IU]/h | INTRAVENOUS | Status: DC
  Administered 2019-06-28: 700 [IU]/h via INTRAVENOUS
  Administered 2019-06-29 – 2019-07-01 (×2): 1000 [IU]/h via INTRAVENOUS
  Administered 2019-07-01: 1500 [IU]/h via INTRAVENOUS
  Administered 2019-07-02 – 2019-07-04 (×5): 1700 [IU]/h via INTRAVENOUS
  Administered 2019-07-05: 2000 [IU]/h via INTRAVENOUS
  Filled 2019-06-28 (×10): qty 250

## 2019-06-28 MED ORDER — ASPIRIN 300 MG RE SUPP
300.0000 mg | RECTAL | Status: AC
  Administered 2019-06-28: 300 mg via RECTAL
  Filled 2019-06-28: qty 1

## 2019-06-28 MED ORDER — PROPOFOL 1000 MG/100ML IV EMUL
INTRAVENOUS | Status: AC
  Filled 2019-06-28: qty 100

## 2019-06-28 MED ORDER — FENTANYL BOLUS VIA INFUSION
50.0000 ug | INTRAVENOUS | Status: DC | PRN
  Administered 2019-06-28 – 2019-07-03 (×6): 50 ug via INTRAVENOUS
  Filled 2019-06-28: qty 50

## 2019-06-28 MED ORDER — FENTANYL 2500MCG IN NS 250ML (10MCG/ML) PREMIX INFUSION
100.0000 ug/h | INTRAVENOUS | Status: DC
  Administered 2019-06-28: 100 ug/h via INTRAVENOUS
  Administered 2019-06-29 – 2019-06-30 (×3): 300 ug/h via INTRAVENOUS
  Administered 2019-06-30: 75 ug/h via INTRAVENOUS
  Administered 2019-07-02: 16:00:00 100 ug/h via INTRAVENOUS
  Administered 2019-07-03: 01:00:00 300 ug/h via INTRAVENOUS
  Administered 2019-07-03: 12:00:00 150 ug/h via INTRAVENOUS
  Administered 2019-07-03 – 2019-07-05 (×7): 400 ug/h via INTRAVENOUS
  Filled 2019-06-28 (×16): qty 250

## 2019-06-28 MED ORDER — SODIUM CHLORIDE 0.9% FLUSH: 10.0000 mL | INTRAVENOUS | Status: DC | PRN

## 2019-06-28 MED ORDER — SODIUM CHLORIDE 0.9% FLUSH
10.0000 mL | Freq: Two times a day (BID) | INTRAVENOUS | Status: DC
  Administered 2019-06-28 – 2019-07-03 (×10): 10 mL

## 2019-06-28 MED ORDER — PROPOFOL 1000 MG/100ML IV EMUL
0.0000 ug/kg/min | INTRAVENOUS | Status: DC
  Administered 2019-06-28: 5 ug/kg/min via INTRAVENOUS

## 2019-06-28 MED ORDER — NOREPINEPHRINE 4 MG/250ML-% IV SOLN
0.0000 ug/min | INTRAVENOUS | Status: DC
  Administered 2019-06-28: 10 ug/min via INTRAVENOUS
  Administered 2019-06-29: 9 ug/min via INTRAVENOUS
  Administered 2019-06-29: 14 ug/min via INTRAVENOUS
  Administered 2019-06-30: 20 ug/min via INTRAVENOUS
  Administered 2019-06-30: 19 ug/min via INTRAVENOUS
  Administered 2019-06-30 (×2): 24 ug/min via INTRAVENOUS
  Administered 2019-06-30: 25 ug/min via INTRAVENOUS
  Administered 2019-06-30: 20 ug/min via INTRAVENOUS
  Administered 2019-07-01: 22 ug/min via INTRAVENOUS
  Administered 2019-07-01: 21 ug/min via INTRAVENOUS
  Filled 2019-06-28 (×4): qty 250
  Filled 2019-06-28: qty 500
  Filled 2019-06-28: qty 250
  Filled 2019-06-28: qty 500
  Filled 2019-06-28 (×4): qty 250

## 2019-06-28 MED ORDER — CISATRACURIUM BOLUS VIA INFUSION
0.0500 mg/kg | INTRAVENOUS | Status: AC | PRN
  Administered 2019-06-29: 2.5 mg via INTRAVENOUS
  Filled 2019-06-28: qty 5

## 2019-06-28 MED ORDER — ROCURONIUM BROMIDE 50 MG/5ML IV SOLN
INTRAVENOUS | Status: DC | PRN
  Administered 2019-06-28: 100 mg via INTRAVENOUS

## 2019-06-28 MED ORDER — HEPARIN BOLUS VIA INFUSION
2500.0000 [IU] | Freq: Once | INTRAVENOUS | Status: AC
  Administered 2019-06-28: 2500 [IU] via INTRAVENOUS
  Filled 2019-06-28: qty 2500

## 2019-06-28 MED ORDER — HEPARIN SODIUM (PORCINE) 5000 UNIT/ML IJ SOLN
5000.0000 [IU] | Freq: Three times a day (TID) | INTRAMUSCULAR | Status: DC
  Filled 2019-06-28: qty 1

## 2019-06-28 MED ORDER — ARTIFICIAL TEARS OPHTHALMIC OINT
1.0000 "application " | TOPICAL_OINTMENT | Freq: Three times a day (TID) | OPHTHALMIC | Status: DC
  Administered 2019-06-28 – 2019-07-03 (×14): 1 via OPHTHALMIC
  Filled 2019-06-28 (×2): qty 3.5

## 2019-06-28 MED ORDER — MIDAZOLAM HCL 2 MG/2ML IJ SOLN: 2.0000 mg | Freq: Once | INTRAMUSCULAR | Status: DC

## 2019-06-28 MED ORDER — SODIUM CHLORIDE 0.9 % IV SOLN
2.0000 g | INTRAVENOUS | Status: DC
  Administered 2019-06-28: 2 g via INTRAVENOUS
  Filled 2019-06-28: qty 2
  Filled 2019-06-28: qty 20

## 2019-06-28 MED ORDER — ORAL CARE MOUTH RINSE
15.0000 mL | OROMUCOSAL | Status: DC
  Administered 2019-06-28 – 2019-07-05 (×61): 15 mL via OROMUCOSAL

## 2019-06-28 MED ORDER — FENTANYL CITRATE (PF) 100 MCG/2ML IJ SOLN: 100.0000 ug | Freq: Once | INTRAMUSCULAR | Status: DC

## 2019-06-28 MED ORDER — CHLORHEXIDINE GLUCONATE CLOTH 2 % EX PADS
6.0000 | MEDICATED_PAD | Freq: Every day | CUTANEOUS | Status: DC
  Administered 2019-06-28 – 2019-07-04 (×7): 6 via TOPICAL

## 2019-06-28 MED ORDER — SODIUM CHLORIDE 0.9 % IV SOLN: INTRAVENOUS | Status: DC | PRN

## 2019-06-28 MED ORDER — FENTANYL CITRATE (PF) 100 MCG/2ML IJ SOLN
100.0000 ug | INTRAMUSCULAR | Status: DC | PRN
  Administered 2019-07-02 (×2): 100 ug via INTRAVENOUS
  Filled 2019-06-28 (×2): qty 2

## 2019-06-28 MED ORDER — CISATRACURIUM BOLUS VIA INFUSION
0.1000 mg/kg | Freq: Once | INTRAVENOUS | Status: AC
  Administered 2019-06-28: 9 mg via INTRAVENOUS
  Filled 2019-06-28: qty 9

## 2019-06-28 MED ORDER — FENTANYL CITRATE (PF) 100 MCG/2ML IJ SOLN
100.0000 ug | INTRAMUSCULAR | Status: AC | PRN
  Administered 2019-06-28 – 2019-07-01 (×3): 100 ug via INTRAVENOUS
  Filled 2019-06-28 (×2): qty 2

## 2019-06-28 MED ORDER — SODIUM CHLORIDE 0.9 % IV SOLN
INTRAVENOUS | Status: DC
  Administered 2019-06-30 – 2019-07-02 (×2): via INTRAVENOUS
  Administered 2019-07-02: 250 mL via INTRAVENOUS
  Administered 2019-07-03: 10 mL/h via INTRAVENOUS

## 2019-06-28 MED ORDER — MIDAZOLAM 50MG/50ML (1MG/ML) PREMIX INFUSION
2.0000 mg/h | INTRAVENOUS | Status: DC
  Administered 2019-06-28: 8 mg/h via INTRAVENOUS
  Administered 2019-06-28: 2 mg/h via INTRAVENOUS
  Administered 2019-06-29 (×2): 10 mg/h via INTRAVENOUS
  Filled 2019-06-28 (×5): qty 50

## 2019-06-28 NOTE — ED Provider Notes (Signed)
[facility] EMERGENCY DEPARTMENT Provider Note   CSN: [id number] Arrival date & time: 17-Jul-2019  1555     History   Chief Complaint Chief Complaint  Patient presents with  . post CPR    HPI Clayton Foster is a 58 y.o. male.     58 year old male with past medical history including CAD status post stenting who presents with collapse.  EMS reports that the patient went for a walk today and then when he walked back inside, he collapsed in front of his mother who immediately called EMS.  Fire arrived quickly within 3 minutes and noted him to be pulseless and unresponsive.  They report that he received 2 defibrillations by AED prior to EMS arrival.  EMS delivered another defibrillation for what they report was ventricular fibrillation.  They obtained ROSC.   Wife later notes that patient was in his usual state of health this week with no recent illness or complaints.  He is compliant with medications.  LEVEL 5 CAVEAT DUE TO UNRESPONSIVENESS  The history is provided by the EMS personnel and the spouse.    No past medical history on file.  Patient Active Problem List   Diagnosis Date Noted  . Cardiac arrest (HCC) 07/17/19    ** The histories are not reviewed yet. Please review them in the "History" navigator section and refresh this SmartLink.    PMH: CAD s/p stenting   Home Medications    Prior to Admission medications   Not on File    Family History No family history on file.  Social History Social History   Tobacco Use  . Smoking status: Not on file  Substance Use Topics  . Alcohol use: Not on file  . Drug use: Not on file     Allergies   Patient has no allergy information on record.   Review of Systems Review of Systems  Unable to perform ROS: Patient unresponsive     Physical Exam Updated Vital Signs BP (!) 141/96   Pulse 88   Temp (!) 95.9 F (35.5 C)   Resp (!) 27   Ht 6\' 2"  (1.88 m)   Wt 90 kg   SpO2 100%   BMI  25.47 kg/m   Physical Exam Vitals signs and nursing note reviewed.  Constitutional:      Appearance: He is well-developed. He is ill-appearing.     Comments: Unresponsive, being bagged by BVM  HENT:     Head: Normocephalic and atraumatic.  Eyes:     Comments: B/l conjunctival injection  Neck:     Musculoskeletal: Neck supple.  Cardiovascular:     Rate and Rhythm: Regular rhythm. Tachycardia present.     Pulses: Normal pulses.     Heart sounds: Normal heart sounds. No murmur.  Pulmonary:     Comments: Some spontaneous respirations w/ equal air entry, occasional pauses Abdominal:     General: Bowel sounds are normal. There is distension (mild).     Palpations: Abdomen is soft.     Tenderness: There is no abdominal tenderness.  Musculoskeletal:     Right lower leg: No edema.     Left lower leg: No edema.  Skin:    General: Skin is dry.  Neurological:     Comments: Eyes open, not following commands, non-verbal, spontaneous movements of extremities but not withdrawing to pain; occasional jerking/shaking movements of extremities      ED Treatments / Results  Labs (all labs ordered are listed, but only  abnormal results are displayed) Labs Reviewed  CBC WITH DIFFERENTIAL/PLATELET - Abnormal; Notable for the following components:      Result Value   WBC 11.7 (*)    Lymphs Abs 5.2 (*)    Abs Immature Granulocytes 0.38 (*)    All other components within normal limits  COMPREHENSIVE METABOLIC PANEL - Abnormal; Notable for the following components:   CO2 17 (*)    Glucose, Bld 297 (*)    Calcium 8.7 (*)    Total Protein 6.2 (*)    AST 80 (*)    ALT 61 (*)    Anion gap 16 (*)    All other components within normal limits  LIPID PANEL - Abnormal; Notable for the following components:   HDL 37 (*)    All other components within normal limits  POCT I-STAT 7, (LYTES, BLD GAS, ICA,H+H) - Abnormal; Notable for the following components:   pH, Arterial 7.329 (*)    pO2, Arterial  135.0 (*)    Acid-base deficit 5.0 (*)    All other components within normal limits  TROPONIN I (HIGH SENSITIVITY) - Abnormal; Notable for the following components:   Troponin I (High Sensitivity) 81 (*)    All other components within normal limits  SARS CORONAVIRUS 2 BY RT PCR (HOSPITAL ORDER, PERFORMED IN [facility])  RESPIRATORY PANEL BY PCR  PROTIME-INR  APTT  RAPID URINE DRUG SCREEN, HOSP PERFORMED  ACETAMINOPHEN LEVEL  LACTIC ACID, PLASMA  ETHANOL  LIPID PANEL  SALICYLATE LEVEL  TRIGLYCERIDES  BASIC METABOLIC PANEL  PROTIME-INR  PROTIME-INR  APTT  APTT  BLOOD GAS, ARTERIAL  BLOOD GAS, ARTERIAL  CBC  CBC  BASIC METABOLIC PANEL  BLOOD GAS, ARTERIAL  MAGNESIUM  PHOSPHORUS  URINE DRUGS OF ABUSE SCREEN W ALC, ROUTINE (REF LAB)  LEGIONELLA PNEUMOPHILA SEROGP 1 UR AG  STREP PNEUMONIAE URINARY ANTIGEN  PROCALCITONIN  PROCALCITONIN  I-STAT ARTERIAL BLOOD GAS, ED  POC SARS CORONAVIRUS 2 AG -  ED  TROPONIN I (HIGH SENSITIVITY)  TROPONIN I (HIGH SENSITIVITY)    EKG EKG Interpretation  Date/Time:  Saturday June 28 2019 15:59:48 EST Ventricular Rate:  84 PR Interval:    QRS Duration: 109 QT Interval:  366 QTC Calculation: 433 R Axis:   -62 Text Interpretation: Sinus rhythm Abnormal R-wave progression, late transition Inferior infarct, old Borderline ST elevation, anterolateral leads No previous ECGs available Confirmed by Frederick PeersLittle, Rachel [phone]([id number]) on 07/16/2019 4:23:18 PM   Radiology Ct Head Wo Contrast  Result Date: 07/04/2019 CLINICAL DATA:  Altered level of consciousness. EXAM: CT HEAD WITHOUT CONTRAST TECHNIQUE: Contiguous axial images were obtained from the base of the skull through the vertex without intravenous contrast. COMPARISON:  None. FINDINGS: Brain: No evidence of acute infarction, hemorrhage, hydrocephalus, extra-axial collection or mass lesion/mass effect. Vascular: No hyperdense vessel or unexpected calcification. Skull: Normal. Negative  for fracture or focal lesion. Sinuses/Orbits: Fluid in the nasal cavity in the setting of NG tube and endotracheal intubation. Other: None. IMPRESSION: No acute intracranial finding. Electronically Signed   By: Donzetta KohutGeoffrey  Wile M.D.   On: 07/10/2019 16:53   Dg Chest Portable 1 View  Result Date: 07/16/2019 CLINICAL DATA:  Tube placement EXAM: PORTABLE CHEST 1 VIEW COMPARISON:  None. FINDINGS: ET tube is approximately 6 cm above carina. There is a nasogastric tube with tip below the field of view. Mild cardiac enlargement. Asymmetric elevation of right hemidiaphragm. Atelectasis noted in the left base. IMPRESSION: 1. Endotracheal tube tip is approximately 6 cm above  carina. Tip of the NG tube is below the field of view. 2. Left base atelectasis and asymmetric elevation of right hemidiaphragm. Electronically Signed   By: Kerby Moors M.D.   On: 07/17/2019 16:30    Procedures .Critical Care Performed by: Sharlett Iles, MD Authorized by: Sharlett Iles, MD   Critical care provider statement:    Critical care time (minutes):  60   Critical care time was exclusive of:  Separately billable procedures and treating other patients   Critical care was necessary to treat or prevent imminent or life-threatening deterioration of the following conditions:  Cardiac failure and respiratory failure   Critical care was time spent personally by me on the following activities:  Development of treatment plan with patient or surrogate, discussions with consultants, discussions with primary provider, obtaining history from patient or surrogate, ordering and performing treatments and interventions, ordering and review of laboratory studies, ordering and review of radiographic studies and re-evaluation of patient's condition Date/Time: 07/17/2019 11:25 PM Performed by: Sharlett Iles, MD Pre-anesthesia Checklist: Patient identified, Emergency Drugs available, Suction available and Patient being  monitored Oxygen Delivery Method: Ambu bag Preoxygenation: Pre-oxygenation with 100% oxygen Induction Type: Rapid sequence Laryngoscope Size: Glidescope and 4 Grade View: Grade I Tube size: 7.5 mm Number of attempts: 1 Placement Confirmation: ETT inserted through vocal cords under direct vision,  Breath sounds checked- equal and bilateral,  Positive ETCO2 and CO2 detector Secured at: 24 cm Tube secured with: ETT holder Dental Injury: Teeth and Oropharynx as per pre-operative assessment  Future Recommendations: Recommend- induction with short-acting agent, and alternative techniques readily available      (including critical care time)  Medications Ordered in ED Medications  0.9 %  sodium chloride infusion (has no administration in time range)  propofol (DIPRIVAN) 1000 MG/100ML infusion (has no administration in time range)  0.9 %  sodium chloride infusion (has no administration in time range)  propofol (DIPRIVAN) 1000 MG/100ML infusion (50 mcg/kg/min  90 kg Intravenous Rate/Dose Change 07/24/2019 1808)  fentaNYL (SUBLIMAZE) injection 100 mcg (100 mcg Intravenous Given 07/12/2019 1805)  fentaNYL (SUBLIMAZE) injection 100 mcg (has no administration in time range)  etomidate (AMIDATE) injection (20 mg Intravenous Given 07/24/2019 1559)  rocuronium (ZEMURON) injection (100 mg Intravenous Given 07/19/2019 1559)  norepinephrine (LEVOPHED) 4mg  in 210mL premix infusion (has no administration in time range)  aspirin suppository 300 mg (has no administration in time range)  cisatracurium (NIMBEX) bolus via infusion 9 mg (has no administration in time range)    And  cisatracurium (NIMBEX) 200 mg in sodium chloride 0.9 % 200 mL (1 mg/mL) infusion (has no administration in time range)    And  cisatracurium (NIMBEX) bolus via infusion 4.5 mg (has no administration in time range)  artificial tears (LACRILUBE) ophthalmic ointment 1 application (has no administration in time range)  heparin injection 5,000  Units (has no administration in time range)  0.9 %  sodium chloride infusion (has no administration in time range)  fentaNYL (SUBLIMAZE) injection 100 mcg (has no administration in time range)  fentaNYL 2526mcg in NS 214mL (57mcg/ml) infusion-PREMIX (has no administration in time range)  fentaNYL (SUBLIMAZE) bolus via infusion 50 mcg (has no administration in time range)  midazolam (VERSED) injection 2 mg (has no administration in time range)  midazolam (VERSED) 50 mg/50 mL (1 mg/mL) premix infusion (has no administration in time range)  midazolam (VERSED) bolus via infusion 2 mg (has no administration in time range)  pantoprazole (PROTONIX) injection 40 mg (has  no administration in time range)     Initial Impression / Assessment and Plan / ED Course  I have reviewed the triage vital signs and the nursing notes.  Pertinent labs & imaging results that were available during my care of the patient were reviewed by me and considered in my medical decision making (see chart for details).       On arrival, patient was unresponsive, being bagged but spontaneously breathing.  He was not answering questions or following commands.  He had occasional short periods of apnea.  EKG showed sinus tachycardia, discussed with STEMI cardiologist Dr. Eldridge Dace who felt that he had no EKG changes warranting emergent catheterization right now.  He recommended continued medical work-up for the time being and agreed with plan for cooling.  I reviewed rhythm strips from EMS which actually did not appear to be ventricular fibrillation.  Patient was started on propofol for sedation.  Head CT negative acute.  Portable chest x-ray confirms tube placement.  Discussed with CCM, Dr. Marchelle Gearing, and pt admitted in critical condition. FAmily updated on plan w/ chaplain present.  Final Clinical Impressions(s) / ED Diagnoses   Final diagnoses:  Cardiopulmonary arrest  [facility])    ED Discharge Orders    None       Little,  Ambrose Finland, MD 07/20/2019 2326

## 2019-06-28 NOTE — ED Notes (Signed)
ICE packs applied

## 2019-06-28 NOTE — ED Notes (Signed)
Wife at bedside.

## 2019-06-28 NOTE — Progress Notes (Signed)
This RN began 33 degree Celsius therapy using Artic Sun Machine @1855 . Confirmed by Caron Presume RN.

## 2019-06-28 NOTE — Progress Notes (Signed)
RT note: RT advanced et tube to 26cm per cxr results. RT and RN transported vent patient from ED to 2H07. Vital signs stable through out.

## 2019-06-28 NOTE — ED Notes (Signed)
Wife is coming for pt her name is STMHD [phone]

## 2019-06-28 NOTE — Progress Notes (Signed)
eLink Physician-Brief Progress Note Patient Name: FITZROY MIKAMI DOB: [birth year] MRN: [id number]   Date of Service  22-Jul-2019  HPI/Events of Note  Troponin = 6,196 in setting of cardiac arrest and CPR. Initial EKG: Sinus rhythm. Abnormal R-wave progression, late transition. Inferior infarct, old. Borderline ST elevation, anterolateral leads. Head CT Scan - no intracranial pathology. Clinical picture c/w NSTEMI. Already on ASA. On Norepinephrine IV infusion and HR = 54. Therefore, will not B-Block.  eICU Interventions  Will order: 1. Heparin IV infusion per pharmacy. 2. D/C Heparin . 3. Repeat EKG in AM. 4. Continue to trend troponin.     Intervention Category Major Interventions: Other:  Sommer,Steven Cornelia Copa 2019-07-22, 10:00 PM

## 2019-06-28 NOTE — Procedures (Signed)
[facility] Procedure Note TRE SANKER [id number] [birth year]  Procedure: RIJ triple lumen central line Indications: cardiopulmonary arrest, need for multiple infusions  Procedure Details Consent: Risks of procedure as well as the alternatives and risks of each were explained to the (patient/caregiver).  Consent for procedure obtained. Time Out: Verified patient identification, verified procedure, site/side was marked, verified correct patient position, special equipment/implants available, medications/allergies/relevent history reviewed, required imaging and test results available.  Performed  Drugs:  100 mcg Fentanyl,  After sterile prep with chloraprep, drape including full barrier precautions, U/S in sterile sleeve, hat, mask, sterile gown and gloves, R IJ indentified with u/s and under direct u/s visualization, the IJ cannulated. Seldinger technique used to place line. All 3  Ports aspirated with easy blood return and flushed with sterile saline, sterile caps placed. Line sutured in place and biopatch and sterile dressing applied.   Evaluation Hemodynamic Status: BP stable throughout; no ectopy or unstable rhythm during placement O2 sats: stable throughout Patient's Current Condition: stable Complications: No apparent complications Patient did tolerate procedure well. Chest X-ray ordered to verify placement.  CXR: pending, but my personal review is that the line is in the superior SVC in good position. ETT advanced and even so with the pts neck in extension the tip is at the manubrium.   Bonna Gains, MD PhD 2019/07/28 6:39 PM

## 2019-06-28 NOTE — ED Notes (Signed)
Pt to CT with Primary RN, EMT and RT

## 2019-06-28 NOTE — Procedures (Signed)
Arterial Catheter Insertion Procedure Note JESSELEE POTH [id number] 04/21/1961  Procedure: Insertion of Arterial Catheter  Indications: Blood pressure monitoring and Frequent blood sampling  Procedure Details Consent: Unable to obtain consent because of emergent medical necessity. Time Out: Verified patient identification, verified procedure, site/side was marked, verified correct patient position, special equipment/implants available, medications/allergies/relevent history reviewed, required imaging and test results available.  Performed  Maximum sterile technique was used including antiseptics, cap, gloves, gown, hand hygiene, mask and sheet. Skin prep: Chlorhexidine; local anesthetic administered 20 gauge catheter was inserted into left radial artery using the Seldinger technique. ULTRASOUND GUIDANCE USED: NO Evaluation Blood flow good; BP tracing good. Complications: No apparent complications. RT placed arrow catheter on first attempt.   Dimple Nanas 14-Jul-2019

## 2019-06-28 NOTE — Progress Notes (Signed)
ANTICOAGULATION CONSULT NOTE - Initial Consult  Pharmacy Consult for heparin  Indication: chest pain/ACS  Not on File  Patient Measurements: Height: 6' 2" (188 cm) Weight: 220 lb 3.8 oz (99.9 kg) IBW/kg (Calculated) : 82.2 Heparin Dosing Weight: 90  Vital Signs: Temp: 90.5 F (32.5 C) (12/05 2200) Temp Source: Bladder (12/05 2152) BP: 94/82 (12/05 2100) Pulse Rate: 50 (12/05 2130)  Labs: Recent Labs    07/16/2019 1555  07/14/2019 1959 07/15/2019 2015 07/01/2019 2106  HGB 15.0   < > 14.3 14.7 13.9  HCT 43.8   < > 42.0 39.8 41.0  PLT 211  --   --  178  --   APTT 29  --   --  29  --   LABPROT 15.0  --   --  15.1  --   INR 1.2  --   --  1.2  --   CREATININE 1.08  --  0.70 0.93  --   TROPONINIHS 81*  --   --  6,196*  --    < > = values in this interval not displayed.    Estimated Creatinine Clearance: 110.7 mL/min (by C-G formula based on SCr of 0.93 mg/dL).   Medical History: No past medical history on file.  Medications:  No medications prior to admission.   Scheduled:  . artificial tears  1 application Both Eyes Q8H  . chlorhexidine gluconate (MEDLINE KIT)  15 mL Mouth Rinse BID  . Chlorhexidine Gluconate Cloth  6 each Topical Daily  . fentaNYL (SUBLIMAZE) injection  100 mcg Intravenous Once  . insulin aspart  2-6 Units Subcutaneous Q4H  . mouth rinse  15 mL Mouth Rinse 10 times per day  . midazolam  2 mg Intravenous Once  . pantoprazole (PROTONIX) IV  40 mg Intravenous QHS  . sodium chloride flush  10-40 mL Intracatheter Q12H    Assessment: 58 yo male s/p cardiac arrest on hypothermia protocol. History of CAD with multiple stents Pharmacy consulted to dose heparin. -hg= 13.9, plt= 178   Goal of Therapy:  Heparin level 0.3-0.7 units/ml Monitor platelets by anticoagulation protocol: Yes   Plan:   -heparin bolus 2500 units then begin infusion at 700 units/hr -Heparin level in 6 hours and daily wth CBC daily  Andrew Meyer, PharmD Clinical  Pharmacist **Pharmacist phone directory can now be found on [email or web address] ([email or web address]).  Listed under [facility].    

## 2019-06-28 NOTE — H&P (Signed)
NAME:  Clayton Foster, MRN:  [id number], DOB:  [birth year], LOS: 0 ADMISSION DATE:  July 09, 2019, CONSULTATION DATE:  09-Jul-2019  REFERRING MD:  ER, CHIEF COMPLAINT:  Cardiac arrest   Also same patient ->  Associated Patient - MRN  Clayton Foster - [id number]     Brief History   Cardiac arrest  History of present illness   History is provided by the wife, and the ETT.  The EDP is Dr. Theotis Burrow.  Patient has a new medical record number but is also known by the medical record [phone].  The other medical record number he has a history of type 2 diabetes, coronary artery disease status post 7 stents, back pain chronic with radiculopathy, diabetes,, anxiety, depression, panic disorder and ongoing smoking associate with hypertension, hyperlipidemia and acid reflux disease  According to the wife he was in his usual state of health.  He had a normal breakfast.  He left for work.  Patient lives with his mother.  He went for his usual walk.  Uncertain what distance.  Uncertain if he had chest pain.  He came back home had some water and then he gasp for air and then became unresponsive.  The mother called 11.  Fire department was there within a few minutes.  Onsite shock was delivered x2 associated with CPR.  The wife was not a witness but the mother was a witness.  Then the EMS came and delivered 1 more shock.  After which he attained ROSC but was unresponsive.  He had a King airway but this removed by the time he got to the ER.  In the ER he was unresponsive.  No obvious reports of posturing.  He got intubated.  Unclear if he got sedation.  He did have a CT head that is normal.  He has been normotensive but requiring 70% oxygen on the ventilator.  Critical care medicine called to admit the patient.  At the time of critical care medicine evaluation around 6 PM 2019-07-09 blood was being suctioned from his oral cavity and this was new.  Patient was not on any anticoagulation.  After consent a  central line was placed.  Critical care medicine admitting the patient.   Pulmonary review shows patient on Robaxin, oxycodone, ibuprofen, gabapentin and then standard medications for his coronary artery disease that include aspirin statin, statin and beta-blocker.  In addition he is on ACE inhibitor and a benzodiazepine for anxiety.  Past Medical History    has no past medical history on file.   has no history on file for tobacco.   The histories are not reviewed yet. Please review them in the "History" navigator section and refresh this [place].  Not on File   There is no immunization history on file for this patient.  No family history on file.   Current Facility-Administered Medications:    0.9 %  sodium chloride infusion, , Intravenous, Continuous, Little, Wenda Overland, MD   Place/Maintain arterial line, , , Until Discontinued **AND** 0.9 %  sodium chloride infusion, , Intra-arterial, PRN, Little, Wenda Overland, MD   etomidate (AMIDATE) injection, , Intravenous, PRN, Little, Wenda Overland, MD, 20 mg at July 09, 2019 1559   fentaNYL (SUBLIMAZE) injection 100 mcg, 100 mcg, Intravenous, Q15 min PRN, Little, Wenda Overland, MD   fentaNYL (SUBLIMAZE) injection 100 mcg, 100 mcg, Intravenous, Q2H PRN, Little, Wenda Overland, MD   propofol (DIPRIVAN) 1000 MG/100ML infusion, , , ,    propofol (DIPRIVAN) 1000 MG/100ML infusion,  0-50 mcg/kg/min, Intravenous, Continuous, Little, Ambrose Finland, MD, Last Rate: 8.1 mL/hr at 07/02/2019 1724, 15 mcg/kg/min at 07/09/2019 1724   rocuronium (ZEMURON) injection, , Intravenous, PRN, Little, Ambrose Finland, MD, 100 mg at 07/09/2019 1559 No current outpatient medications on file.    Significant Hospital Events   07/12/2019 - admit  Consults:  07/21/2019 -cards Dr Eldridge Dace  Procedures:  07/03/2019 - intubation  Significant Diagnostic Tests:  07/21/2019  - CT head  Micro Data:  Blood Urine Bal PCT RVP Covid rapid antigen  negative  Antimicrobials:  Ceftriaxone empiric    Interim history/subjective:  07/24/2019  - seen in ER  Objective   Blood pressure (!) 141/96, pulse 83, temperature (!) 95.8 F (35.4 C), resp. rate 20, height 6\' 2"  (1.88 m), weight 90 kg, SpO2 99 %.    Vent Mode: PRVC FiO2 (%):  [100 %] 100 % Set Rate:  [20 bmp] 20 bmp Vt Set:  [620 mL] 620 mL PEEP:  [5 cmH20] 5 cmH20 Plateau Pressure:  [18 cmH20] 18 cmH20  No intake or output data in the 24 hours ending 07/01/2019 1740 Filed Weights   07/16/2019 1600 07/18/2019 1612  Weight: 90 kg 90 kg    Examination: General: Well-built male intubated and lying in the bed in the emergency department resuscitation room HENT: Endotracheal tube present.  26 cm.  Respiratory therapy suctioning blood from the oral cavity Lungs: Clear to auscultation bilaterally equal breath sounds.  70% oxygen respiratory rate 20 Cardiovascular: Regular rate and rhythm Abdomen: Soft no organomegaly Extremities: No cyanosis no clubbing no edema Neuro: Unresponsive.  No obvious seizures reported by the ER nurse GU: Looks normal  Resolved Hospital Problem list   X  Assessment & Plan:  ASSESSMENT / PLAN:  PULMONARY A:  Acute respiratory failure following cardiac arrest intubated in the ER June 28, 2019    P:   PRVC Bundle VAP   NEUROLOGIC A:   Unresponsive following cardiac arrest. P:   Induced hypothermia protocol -targeted temperature management 33 Celsius    VASCULAR A:   Maintaining blood pressure postarrest  P:  Mean artery pressure goal greater than 85 Place central line  CARDIAC STRUCTURAL A: History of coronary artery disease with metabolic syndrome.  Status post 7 stents.  Last echocardiogram 2015 with results not known    P: Cardiology consult by Dr. 2016 Cycle cardiac enzymes Get echo  CARDIAC ELECTRICAL A: Shockable rhythm cardiac arrest -witnessed arrest June 28, 2019  P: Cardiology  consult  INFECTIOUS A:   Covid negative.  No antecedent infectious symptoms P:   Check respiratory virus panel [place] Empiric ceftriaxone but low threshold to discontinue depending on procalcitonin  RENAL A:  At risk for acute kidney injury P:  Maintain hemodynamics  ELECTROLYTES A:  At risk for significant electrolyte imbalance during induced hypothermia P: Monitor and correct as needed   GASTROINTESTINAL A:   History of acid reflux disease  P:   PPI  HEMATOLOGIC A:  Oral cavity bleeding and at risk for anemia of critical illness   P:  - PRBC for hgb </= 8.0 gm%    -  active bleeding with hemodynamic instability, then transfuse regardless of hemoglobin value   At at all times try to transfuse 1 unit prbc as possible with exception of active hemorrhage     ENDOCRINE A:   History of diabetes P:   Sliding scale insulin  MSK/DERM At risk for sacral decub   Best practice:  Diet: Tube feeds  Pain/Anxiety/Delirium protocol (if indicated): Deep sedation with paralysis during induced hypothermia VAP protocol (if indicated): Bundle DVT prophylaxis: Heparin subcutaneous unless cardiology wants to do IV anticoagulation GI prophylaxis: PPI Glucose control: SSI Mobility: Bedrest Code Status: Full code Family Communication: Wife and daughter in the consultation chamber in the emergency department Disposition: Moved from emergency department to 2 heart cardiac ICU      ATTESTATION & SIGNATURE   The patient Clayton Foster is critically ill with multiple organ systems failure and requires high complexity decision making for assessment and support, frequent evaluation and titration of therapies, application of advanced monitoring technologies and extensive interpretation of multiple databases.   Critical Care Time devoted to patient care services described in this note is  60  Minutes. This time reflects time of care of this signee Dr Kalman ShanMurali Prudie Guthridge. This  critical care time does not reflect procedure time, or teaching time or supervisory time of PA/NP/Med student/Med Resident etc but could involve care discussion time     Dr. Kalman ShanMurali Briscoe Daniello, M.D., [facility]C.P Pulmonary and Critical Care Medicine Staff Physician [facility] Pager: [phone], If no answer or between  15:00h - 7:00h: call [phone]  07/06/2019 5:40 PM     LABS    PULMONARY Recent Labs  Lab 07/24/2019 1714  PHART 7.329*  PCO2ART 39.0  PO2ART 135.0*  HCO3 20.8  TCO2 22  O2SAT 99.0    CBC Recent Labs  Lab 07/08/2019 1555 07/10/2019 1714  HGB 15.0 13.6  HCT 43.8 40.0  WBC 11.7*  --   PLT 211  --     COAGULATION Recent Labs  Lab 07/03/2019 1555  INR 1.2    CARDIAC  No results for input(s): TROPONINI in the last 168 hours. No results for input(s): PROBNP in the last 168 hours.   CHEMISTRY Recent Labs  Lab 07/04/2019 1555 07/04/2019 1714  NA 137 137  K 4.1 4.1  CL 104  --   CO2 17*  --   GLUCOSE 297*  --   BUN 12  --   CREATININE 1.08  --   CALCIUM 8.7*  --    Estimated Creatinine Clearance: 87.7 mL/min (by C-G formula based on SCr of 1.08 mg/dL).   LIVER Recent Labs  Lab 07/04/2019 1555  AST 80*  ALT 61*  ALKPHOS 94  BILITOT 0.8  PROT 6.2*  ALBUMIN 3.7  INR 1.2     INFECTIOUS No results for input(s): LATICACIDVEN, PROCALCITON in the last 168 hours.   ENDOCRINE CBG (last 3)  No results for input(s): GLUCAP in the last 72 hours.       IMAGING x48h  - image(s) personally visualized  -   highlighted in bold Ct Head Wo Contrast  Result Date: 07/04/2019 CLINICAL DATA:  Altered level of consciousness. EXAM: CT HEAD WITHOUT CONTRAST TECHNIQUE: Contiguous axial images were obtained from the base of the skull through the vertex without intravenous contrast. COMPARISON:  None. FINDINGS: Brain: No evidence of acute infarction, hemorrhage, hydrocephalus, extra-axial collection or mass  lesion/mass effect. Vascular: No hyperdense vessel or unexpected calcification. Skull: Normal. Negative for fracture or focal lesion. Sinuses/Orbits: Fluid in the nasal cavity in the setting of NG tube and endotracheal intubation. Other: None. IMPRESSION: No acute intracranial finding. Electronically Signed   By: Donzetta KohutGeoffrey  Wile M.D.   On: 07/17/2019 16:53   Dg Chest Portable 1 View  Result Date: 06/26/2019 CLINICAL DATA:  Tube placement EXAM: PORTABLE  CHEST 1 VIEW COMPARISON:  None. FINDINGS: ET tube is approximately 6 cm above carina. There is a nasogastric tube with tip below the field of view. Mild cardiac enlargement. Asymmetric elevation of right hemidiaphragm. Atelectasis noted in the left base. IMPRESSION: 1. Endotracheal tube tip is approximately 6 cm above carina. Tip of the NG tube is below the field of view. 2. Left base atelectasis and asymmetric elevation of right hemidiaphragm. Electronically Signed   By: Signa Kell M.D.   On: 07-21-19 16:30

## 2019-06-28 NOTE — ED Notes (Addendum)
Wife -- Garfield Coiner -- [phone].--- on the way

## 2019-06-28 NOTE — Progress Notes (Signed)
Chaplain responded for post cpr family support. Chaplain spent 2 hours with family, Che[place], and ED staff back and forth offering support and information/assistance to the family. Chaplain escorted wife Santiago Glad and daughter to the [facility] waiting area and notified nursing staff of their arrival. Bonney Roussel remains available for support as needs arise.   Chaplain Resident, Evelene Croon, [place]. [phone]

## 2019-06-29 ENCOUNTER — Inpatient Hospital Stay (HOSPITAL_COMMUNITY): Payer: Medicaid Other

## 2019-06-29 DIAGNOSIS — G40901 Epilepsy, unspecified, not intractable, with status epilepticus: Secondary | ICD-10-CM

## 2019-06-29 DIAGNOSIS — I469 Cardiac arrest, cause unspecified: Secondary | ICD-10-CM

## 2019-06-29 DIAGNOSIS — J9601 Acute respiratory failure with hypoxia: Secondary | ICD-10-CM

## 2019-06-29 DIAGNOSIS — I2510 Atherosclerotic heart disease of native coronary artery without angina pectoris: Secondary | ICD-10-CM

## 2019-06-29 LAB — BASIC METABOLIC PANEL
Anion gap: 10 (ref 5–15)
Anion gap: 9 (ref 5–15)
Anion gap: 6 (ref 5–15)
Anion gap: 8 (ref 5–15)
BUN: 18 mg/dL (ref 6–20)
BUN: 14 mg/dL (ref 6–20)
BUN: 13 mg/dL (ref 6–20)
BUN: 16 mg/dL (ref 6–20)
CO2: 18 mmol/L — ABNORMAL LOW (ref 22–32)
CO2: 21 mmol/L — ABNORMAL LOW (ref 22–32)
CO2: 22 mmol/L (ref 22–32)
CO2: 19 mmol/L — ABNORMAL LOW (ref 22–32)
Calcium: 8.4 mg/dL — ABNORMAL LOW (ref 8.9–10.3)
Calcium: 9 mg/dL (ref 8.9–10.3)
Calcium: 8.5 mg/dL — ABNORMAL LOW (ref 8.9–10.3)
Calcium: 8.2 mg/dL — ABNORMAL LOW (ref 8.9–10.3)
Chloride: 110 mmol/L (ref 98–111)
Chloride: 110 mmol/L (ref 98–111)
Chloride: 113 mmol/L — ABNORMAL HIGH (ref 98–111)
Chloride: 111 mmol/L (ref 98–111)
Creatinine, Ser: 0.83 mg/dL (ref 0.61–1.24)
Creatinine, Ser: 0.68 mg/dL (ref 0.61–1.24)
Creatinine, Ser: 0.7 mg/dL (ref 0.61–1.24)
Creatinine, Ser: 1.07 mg/dL (ref 0.61–1.24)
GFR calc Af Amer: >60 mL/min (ref >60)
GFR calc Af Amer: >60 mL/min (ref >60)
GFR calc Af Amer: >60 mL/min (ref >60)
GFR calc Af Amer: >60 mL/min (ref >60)
GFR calc non Af Amer: >60 mL/min (ref >60)
GFR calc non Af Amer: >60 mL/min (ref >60)
GFR calc non Af Amer: >60 mL/min (ref >60)
GFR calc non Af Amer: >60 mL/min (ref >60)
Glucose, Bld: 156 mg/dL — ABNORMAL HIGH (ref 70–99)
Glucose, Bld: 186 mg/dL — ABNORMAL HIGH (ref 70–99)
Glucose, Bld: 167 mg/dL — ABNORMAL HIGH (ref 70–99)
Glucose, Bld: 147 mg/dL — ABNORMAL HIGH (ref 70–99)
Potassium: 3.5 mmol/L (ref 3.5–5.1)
Potassium: 2.7 mmol/L — CL (ref 3.5–5.1)
Potassium: 4 mmol/L (ref 3.5–5.1)
Potassium: 5.3 mmol/L — ABNORMAL HIGH (ref 3.5–5.1)
Sodium: 138 mmol/L (ref 135–145)
Sodium: 140 mmol/L (ref 135–145)
Sodium: 141 mmol/L (ref 135–145)
Sodium: 138 mmol/L (ref 135–145)

## 2019-06-29 LAB — RESPIRATORY PANEL BY PCR

## 2019-06-29 LAB — POCT I-STAT 7, (LYTES, BLD GAS, ICA,H+H)
Acid-base deficit: 3 mmol/L — ABNORMAL HIGH (ref 0.0–2.0)
Acid-base deficit: 4 mmol/L — ABNORMAL HIGH (ref 0.0–2.0)
Acid-base deficit: 5 mmol/L — ABNORMAL HIGH (ref 0.0–2.0)
Acid-base deficit: 5 mmol/L — ABNORMAL HIGH (ref 0.0–2.0)
Bicarbonate: 19.3 mmol/L — ABNORMAL LOW (ref 20.0–28.0)
Bicarbonate: 18.5 mmol/L — ABNORMAL LOW (ref 20.0–28.0)
Bicarbonate: 19.9 mmol/L — ABNORMAL LOW (ref 20.0–28.0)
Bicarbonate: 22.9 mmol/L (ref 20.0–28.0)
Calcium, Ion: 1.17 mmol/L (ref 1.15–1.40)
Calcium, Ion: 1.17 mmol/L (ref 1.15–1.40)
Calcium, Ion: 1.21 mmol/L (ref 1.15–1.40)
Calcium, Ion: 1.2 mmol/L (ref 1.15–1.40)
HCT: 41 % (ref 39.0–52.0)
HCT: 42 % (ref 39.0–52.0)
HCT: 41 % (ref 39.0–52.0)
HCT: 42 % (ref 39.0–52.0)
Hemoglobin: 13.9 g/dL (ref 13.0–17.0)
Hemoglobin: 14.3 g/dL (ref 13.0–17.0)
Hemoglobin: 13.9 g/dL (ref 13.0–17.0)
Hemoglobin: 14.3 g/dL (ref 13.0–17.0)
O2 Saturation: 98 %
O2 Saturation: 99 %
O2 Saturation: 98 %
O2 Saturation: 99 %
Patient temperature: 32.2
Patient temperature: 32.4
Patient temperature: 91.4
Potassium: 3.5 mmol/L (ref 3.5–5.1)
Potassium: 3.5 mmol/L (ref 3.5–5.1)
Potassium: 3 mmol/L — ABNORMAL LOW (ref 3.5–5.1)
Potassium: 5.3 mmol/L — ABNORMAL HIGH (ref 3.5–5.1)
Sodium: 137 mmol/L (ref 135–145)
Sodium: 138 mmol/L (ref 135–145)
Sodium: 143 mmol/L (ref 135–145)
Sodium: 140 mmol/L (ref 135–145)
TCO2: 20 mmol/L — ABNORMAL LOW (ref 22–32)
TCO2: 19 mmol/L — ABNORMAL LOW (ref 22–32)
TCO2: 21 mmol/L — ABNORMAL LOW (ref 22–32)
TCO2: 24 mmol/L (ref 22–32)
pCO2 arterial: 25.5 mmHg — ABNORMAL LOW (ref 32.0–48.0)
pCO2 arterial: 21.6 mmHg — ABNORMAL LOW (ref 32.0–48.0)
pCO2 arterial: 28.8 mmHg — ABNORMAL LOW (ref 32.0–48.0)
pCO2 arterial: 42.3 mmHg (ref 32.0–48.0)
pH, Arterial: 7.486 — ABNORMAL HIGH (ref 7.350–7.450)
pH, Arterial: 7.522 — ABNORMAL HIGH (ref 7.350–7.450)
pH, Arterial: 7.426 (ref 7.350–7.450)
pH, Arterial: 7.32 — ABNORMAL LOW (ref 7.350–7.450)
pO2, Arterial: 102 mmHg (ref 83.0–108.0)
pO2, Arterial: 95 mmHg (ref 83.0–108.0)
pO2, Arterial: 76 mmHg — ABNORMAL LOW (ref 83.0–108.0)
pO2, Arterial: 159 mmHg — ABNORMAL HIGH (ref 83.0–108.0)

## 2019-06-29 LAB — GLUCOSE, CAPILLARY
Glucose-Capillary: 137 mg/dL — ABNORMAL HIGH (ref 70–99)
Glucose-Capillary: 141 mg/dL — ABNORMAL HIGH (ref 70–99)
Glucose-Capillary: 144 mg/dL — ABNORMAL HIGH (ref 70–99)
Glucose-Capillary: 145 mg/dL — ABNORMAL HIGH (ref 70–99)
Glucose-Capillary: 152 mg/dL — ABNORMAL HIGH (ref 70–99)
Glucose-Capillary: 153 mg/dL — ABNORMAL HIGH (ref 70–99)
Glucose-Capillary: 157 mg/dL — ABNORMAL HIGH (ref 70–99)
Glucose-Capillary: 158 mg/dL — ABNORMAL HIGH (ref 70–99)
Glucose-Capillary: 162 mg/dL — ABNORMAL HIGH (ref 70–99)
Glucose-Capillary: 169 mg/dL — ABNORMAL HIGH (ref 70–99)
Glucose-Capillary: 178 mg/dL — ABNORMAL HIGH (ref 70–99)
Glucose-Capillary: 179 mg/dL — ABNORMAL HIGH (ref 70–99)
Glucose-Capillary: 186 mg/dL — ABNORMAL HIGH (ref 70–99)
Glucose-Capillary: 189 mg/dL — ABNORMAL HIGH (ref 70–99)

## 2019-06-29 LAB — VALPROIC ACID LEVEL: Valproic Acid Lvl: 63 ug/mL (ref 50.0–100.0)

## 2019-06-29 LAB — POCT I-STAT, CHEM 8
BUN: 17 mg/dL (ref 6–20)
BUN: 17 mg/dL (ref 6–20)
BUN: 18 mg/dL (ref 6–20)
BUN: 18 mg/dL (ref 6–20)
Calcium, Ion: 1.12 mmol/L — ABNORMAL LOW (ref 1.15–1.40)
Calcium, Ion: 1.12 mmol/L — ABNORMAL LOW (ref 1.15–1.40)
Calcium, Ion: 1.18 mmol/L (ref 1.15–1.40)
Calcium, Ion: 1.23 mmol/L (ref 1.15–1.40)
Chloride: 106 mmol/L (ref 98–111)
Chloride: 108 mmol/L (ref 98–111)
Chloride: 111 mmol/L (ref 98–111)
Chloride: 112 mmol/L — ABNORMAL HIGH (ref 98–111)
Creatinine, Ser: 0.6 mg/dL — ABNORMAL LOW (ref 0.61–1.24)
Creatinine, Ser: 0.7 mg/dL (ref 0.61–1.24)
Creatinine, Ser: 0.8 mg/dL (ref 0.61–1.24)
Creatinine, Ser: 0.9 mg/dL (ref 0.61–1.24)
Glucose, Bld: 144 mg/dL — ABNORMAL HIGH (ref 70–99)
Glucose, Bld: 149 mg/dL — ABNORMAL HIGH (ref 70–99)
Glucose, Bld: 159 mg/dL — ABNORMAL HIGH (ref 70–99)
Glucose, Bld: 176 mg/dL — ABNORMAL HIGH (ref 70–99)
HCT: 41 % (ref 39.0–52.0)
HCT: 42 % (ref 39.0–52.0)
HCT: 43 % (ref 39.0–52.0)
HCT: 43 % (ref 39.0–52.0)
Hemoglobin: 13.9 g/dL (ref 13.0–17.0)
Hemoglobin: 14.3 g/dL (ref 13.0–17.0)
Hemoglobin: 14.6 g/dL (ref 13.0–17.0)
Hemoglobin: 14.6 g/dL (ref 13.0–17.0)
Potassium: 3.4 mmol/L — ABNORMAL LOW (ref 3.5–5.1)
Potassium: 3.6 mmol/L (ref 3.5–5.1)
Potassium: 5.3 mmol/L — ABNORMAL HIGH (ref 3.5–5.1)
Potassium: 5.6 mmol/L — ABNORMAL HIGH (ref 3.5–5.1)
Sodium: 140 mmol/L (ref 135–145)
Sodium: 140 mmol/L (ref 135–145)
Sodium: 141 mmol/L (ref 135–145)
Sodium: 142 mmol/L (ref 135–145)
TCO2: 19 mmol/L — ABNORMAL LOW (ref 22–32)
TCO2: 21 mmol/L — ABNORMAL LOW (ref 22–32)
TCO2: 22 mmol/L (ref 22–32)
TCO2: 22 mmol/L (ref 22–32)

## 2019-06-29 LAB — TROPONIN I (HIGH SENSITIVITY)
Troponin I (High Sensitivity): 7866 ng/L (ref <18)
Troponin I (High Sensitivity): 6742 ng/L (ref <18)

## 2019-06-29 LAB — APTT
aPTT: 52 seconds — ABNORMAL HIGH (ref 24–36)
aPTT: 65 seconds — ABNORMAL HIGH (ref 24–36)

## 2019-06-29 LAB — ECHOCARDIOGRAM COMPLETE
Height: 74 in
Weight: 3523.83 oz

## 2019-06-29 LAB — PROCALCITONIN: Procalcitonin: 0.23 ng/mL

## 2019-06-29 LAB — CBC
HCT: 41.8 % (ref 39.0–52.0)
Hemoglobin: 15.2 g/dL (ref 13.0–17.0)
MCH: 33.2 pg (ref 26.0–34.0)
MCHC: 36.4 g/dL — ABNORMAL HIGH (ref 30.0–36.0)
MCV: 91.3 fL (ref 80.0–100.0)
Platelets: 206 10*3/uL (ref 150–400)
RBC: 4.58 MIL/uL (ref 4.22–5.81)
RDW: 12.7 % (ref 11.5–15.5)
WBC: 12.2 10*3/uL — ABNORMAL HIGH (ref 4.0–10.5)
nRBC: 0 % (ref 0.0–0.2)

## 2019-06-29 LAB — HEMOGLOBIN A1C
Hgb A1c MFr Bld: 7.1 % — ABNORMAL HIGH (ref 4.8–5.6)
Mean Plasma Glucose: 157.07 mg/dL

## 2019-06-29 LAB — PROTIME-INR
INR: 1.2 (ref 0.8–1.2)
INR: 1.1 (ref 0.8–1.2)
Prothrombin Time: 14.7 seconds (ref 11.4–15.2)
Prothrombin Time: 14.3 seconds (ref 11.4–15.2)

## 2019-06-29 LAB — STREP PNEUMONIAE URINARY ANTIGEN: Strep Pneumo Urinary Antigen: NEGATIVE

## 2019-06-29 LAB — HEPARIN LEVEL (UNFRACTIONATED)
Heparin Unfractionated: 0.15 IU/mL — ABNORMAL LOW (ref 0.30–0.70)
Heparin Unfractionated: 0.28 IU/mL — ABNORMAL LOW (ref 0.30–0.70)

## 2019-06-29 LAB — PHOSPHORUS: Phosphorus: 2.3 mg/dL — ABNORMAL LOW (ref 2.5–4.6)

## 2019-06-29 LAB — MAGNESIUM: Magnesium: 1.7 mg/dL (ref 1.7–2.4)

## 2019-06-29 MED ORDER — PRO-STAT SUGAR FREE PO LIQD
30.0000 mL | Freq: Two times a day (BID) | ORAL | Status: DC
  Administered 2019-06-29 – 2019-06-30 (×3): 30 mL
  Filled 2019-06-29 (×3): qty 30

## 2019-06-29 MED ORDER — VALPROATE SODIUM 500 MG/5ML IV SOLN
1500.0000 mg | Freq: Once | INTRAVENOUS | Status: AC
  Administered 2019-06-29: 1500 mg via INTRAVENOUS
  Filled 2019-06-29: qty 15

## 2019-06-29 MED ORDER — MIDAZOLAM HCL-SODIUM CHLORIDE 100-0.9 MG/100ML-% IV SOLN: 35.0000 mg/h | INTRAVENOUS | Status: DC

## 2019-06-29 MED ORDER — SODIUM CHLORIDE 0.9 % IV SOLN
1.0000 ug/kg/min | INTRAVENOUS | Status: DC
  Administered 2019-06-29: 1.5 ug/kg/min via INTRAVENOUS
  Filled 2019-06-29 (×2): qty 20

## 2019-06-29 MED ORDER — SODIUM CHLORIDE 0.9 % IV SOLN
0.0000 mg/h | INTRAVENOUS | Status: DC
  Administered 2019-06-29 – 2019-06-30 (×3): 40 mg/h via INTRAVENOUS
  Administered 2019-06-30: 8 mg/h via INTRAVENOUS
  Filled 2019-06-29 (×4): qty 50
  Filled 2019-06-29: qty 40
  Filled 2019-06-29: qty 50

## 2019-06-29 MED ORDER — MIDAZOLAM 50MG/50ML (1MG/ML) PREMIX INFUSION: 35.0000 mg/h | INTRAVENOUS | Status: DC

## 2019-06-29 MED ORDER — MIDAZOLAM BOLUS VIA INFUSION
0.2000 mg/kg | INTRAVENOUS | Status: DC | PRN
  Filled 2019-06-29: qty 20

## 2019-06-29 MED ORDER — PROPOFOL BOLUS VIA INFUSION
1.0000 mg/kg | Freq: Once | INTRAVENOUS | Status: AC
  Administered 2019-06-29: 99.9 mg via INTRAVENOUS
  Filled 2019-06-29: qty 100

## 2019-06-29 MED ORDER — VITAL HIGH PROTEIN PO LIQD
1000.0000 mL | ORAL | Status: DC
  Administered 2019-06-29: 14:00:00 1000 mL

## 2019-06-29 MED ORDER — SODIUM CHLORIDE 0.9 % IV SOLN
2000.0000 mg | Freq: Once | INTRAVENOUS | Status: AC
  Administered 2019-06-29: 2000 mg via INTRAVENOUS
  Filled 2019-06-29: qty 20

## 2019-06-29 MED ORDER — MIDAZOLAM 50MG/50ML (1MG/ML) PREMIX INFUSION: 30.0000 mg/h | INTRAVENOUS | Status: DC

## 2019-06-29 MED ORDER — MIDAZOLAM HCL (PF) 5 MG/ML IJ SOLN
20.0000 mg | INTRAMUSCULAR | Status: DC | PRN
  Administered 2019-06-29 (×5): 20 mg via INTRAVENOUS
  Filled 2019-06-29 (×5): qty 4

## 2019-06-29 MED ORDER — POTASSIUM CHLORIDE 20 MEQ/15ML (10%) PO SOLN
40.0000 meq | ORAL | Status: AC
  Administered 2019-06-29 (×2): 40 meq
  Filled 2019-06-29 (×4): qty 30

## 2019-06-29 MED ORDER — VALPROATE SODIUM 500 MG/5ML IV SOLN
500.0000 mg | Freq: Three times a day (TID) | INTRAVENOUS | Status: DC
  Administered 2019-06-29 – 2019-07-05 (×17): 500 mg via INTRAVENOUS
  Filled 2019-06-29 (×22): qty 5

## 2019-06-29 MED ORDER — POTASSIUM CHLORIDE 10 MEQ/50ML IV SOLN
10.0000 meq | INTRAVENOUS | Status: AC
  Administered 2019-06-29 (×4): 10 meq via INTRAVENOUS
  Filled 2019-06-29 (×4): qty 50

## 2019-06-29 MED ORDER — POTASSIUM PHOSPHATES 15 MMOLE/5ML IV SOLN
20.0000 mmol | Freq: Once | INTRAVENOUS | Status: AC
  Administered 2019-06-29: 20 mmol via INTRAVENOUS
  Filled 2019-06-29: qty 6.67

## 2019-06-29 MED ORDER — PROPOFOL 1000 MG/100ML IV EMUL
5.0000 ug/kg/min | INTRAVENOUS | Status: DC
  Administered 2019-06-29: 40 ug/kg/min via INTRAVENOUS
  Administered 2019-06-29: 20 ug/kg/min via INTRAVENOUS
  Administered 2019-06-29: 40 ug/kg/min via INTRAVENOUS
  Administered 2019-06-30: 13:00:00 60 ug/kg/min via INTRAVENOUS
  Administered 2019-06-30: 40 ug/kg/min via INTRAVENOUS
  Administered 2019-06-30 (×3): 60 ug/kg/min via INTRAVENOUS
  Administered 2019-06-30: 35 ug/kg/min via INTRAVENOUS
  Administered 2019-07-01: 5 ug/kg/min via INTRAVENOUS
  Administered 2019-07-03: 15 ug/kg/min via INTRAVENOUS
  Administered 2019-07-04: 10 ug/kg/min via INTRAVENOUS
  Administered 2019-07-04 (×2): 15 ug/kg/min via INTRAVENOUS
  Administered 2019-07-05: 8 ug/kg/min via INTRAVENOUS
  Filled 2019-06-29: qty 100
  Filled 2019-06-29: qty 200
  Filled 2019-06-29 (×14): qty 100

## 2019-06-29 MED ORDER — SODIUM CHLORIDE 0.9 % IV SOLN
750.0000 mg | Freq: Two times a day (BID) | INTRAVENOUS | Status: DC
  Administered 2019-06-29 – 2019-07-05 (×12): 750 mg via INTRAVENOUS
  Filled 2019-06-29 (×13): qty 7.5

## 2019-06-29 MED ORDER — CALCIUM GLUCONATE-NACL 1-0.675 GM/50ML-% IV SOLN
1.0000 g | Freq: Once | INTRAVENOUS | Status: AC
  Administered 2019-06-29: 07:00:00 1000 mg via INTRAVENOUS
  Filled 2019-06-29: qty 50

## 2019-06-29 MED ORDER — MIDAZOLAM 50MG/50ML (1MG/ML) PREMIX INFUSION
30.0000 mg/h | INTRAVENOUS | Status: DC
  Administered 2019-06-29: 30 mg/h via INTRAVENOUS
  Administered 2019-06-29 (×2): 20 mg/h via INTRAVENOUS
  Filled 2019-06-29: qty 50

## 2019-06-29 MED ORDER — MIDAZOLAM 100 MG/100ML IV SOLN: 35.0000 mg/h | INTRAVENOUS | Status: DC

## 2019-06-29 MED ORDER — SODIUM CHLORIDE 0.9 % IV SOLN
40.0000 mg/h | INTRAVENOUS | Status: DC
  Administered 2019-06-29: 40 mg/h via INTRAVENOUS
  Administered 2019-06-29: 35 mg/h via INTRAVENOUS
  Filled 2019-06-29 (×4): qty 20

## 2019-06-29 MED ORDER — SODIUM CHLORIDE 0.9 % IV SOLN
30.0000 mg/h | INTRAVENOUS | Status: DC
  Administered 2019-06-29: 30 mg/h via INTRAVENOUS
  Filled 2019-06-29 (×2): qty 20

## 2019-06-29 MED ORDER — DOPAMINE-DEXTROSE 3.2-5 MG/ML-% IV SOLN
0.0000 ug/kg/min | INTRAVENOUS | Status: DC
  Administered 2019-06-29: 5 ug/kg/min via INTRAVENOUS
  Filled 2019-06-29: qty 250

## 2019-06-29 MED ORDER — MAGNESIUM SULFATE IN D5W 1-5 GM/100ML-% IV SOLN
1.0000 g | Freq: Once | INTRAVENOUS | Status: AC
  Administered 2019-06-29: 1 g via INTRAVENOUS
  Filled 2019-06-29: qty 100

## 2019-06-29 NOTE — Progress Notes (Signed)
ANTICOAGULATION CONSULT NOTE   Pharmacy Consult for Heparin  Indication: chest pain/ACS  No Known Allergies  Patient Measurements: Height: '6\' 2"'  (188 cm) Weight: 220 lb 3.8 oz (99.9 kg) IBW/kg (Calculated) : 82.2 Heparin Dosing Weight: 90  Vital Signs: Temp: 89.2 F (31.8 C) (12/06 0500) Temp Source: Bladder (12/06 0500) BP: 71/52 (12/06 0530) Pulse Rate: 66 (12/06 0530)  Labs: Recent Labs    07/06/2019 1555  07/12/2019 2015  06/30/2019 2207  06/29/19 0005 06/29/19 0153 06/29/19 0354 06/29/19 0406 06/29/19 0434  HGB 15.0   < > 14.7   < >  --    < > 14.3 13.9 15.2 13.9 14.3  HCT 43.8   < > 39.8   < >  --    < > 42.0 41.0 41.8 41.0 42.0  PLT 211  --  178  --   --   --   --   --  206  --   --   APTT 29  --  29  --   --   --   --   --  52*  --   --   LABPROT 15.0  --  15.1  --   --   --   --   --  14.7  --   --   INR 1.2  --  1.2  --   --   --   --   --  1.2  --   --   HEPARINUNFRC  --   --   --   --   --   --   --   --  0.15*  --   --   CREATININE 1.08   < > 0.93  --   --    < > 0.60* 0.70 0.83  --   --   TROPONINIHS 81*  --  6,196*  --  12,349*  --   --   --   --   --   --    < > = values in this interval not displayed.    Estimated Creatinine Clearance: 124 mL/min (by C-G formula based on SCr of 0.83 mg/dL).   Medical History: No past medical history on file.  Medications:  No medications prior to admission.   Scheduled:  . artificial tears  1 application Both Eyes J0K  . chlorhexidine gluconate (MEDLINE KIT)  15 mL Mouth Rinse BID  . Chlorhexidine Gluconate Cloth  6 each Topical Daily  . fentaNYL (SUBLIMAZE) injection  100 mcg Intravenous Once  . insulin aspart  2-6 Units Subcutaneous Q4H  . mouth rinse  15 mL Mouth Rinse 10 times per day  . midazolam  2 mg Intravenous Once  . pantoprazole (PROTONIX) IV  40 mg Intravenous QHS  . sodium chloride flush  10-40 mL Intracatheter Q12H    Assessment: 58 yo male s/p cardiac arrest on hypothermia protocol. History of  CAD with multiple stents Pharmacy consulted to dose heparin. -hg= 13.9, plt= 178  12/6 AM update:  Heparin level low Still in cooling phase Scant amount of bleeding from mouth (watch)   Goal of Therapy:  Heparin level 0.3-0.7 units/ml Monitor platelets by anticoagulation protocol: Yes   Plan:   -Inc heparin to 850 units/hr -Re-check heparin level at [place], PharmD, [place] Pharmacist Phone: [phone]

## 2019-06-29 NOTE — Progress Notes (Signed)
CRITICAL VALUE ALERT  Critical Value:  Troponin 6,196  Date & Time Notied:  07/23/2019 2135  Provider Notified: MD Oletta Darter  Orders Received/Actions taken: heparin gtt ordered, AM EKG

## 2019-06-29 NOTE — Consult Note (Addendum)
NEURO HOSPITALIST CONSULT NOTE   Requestig physician: Dr. Celine Mans   Reason for Consult:Status Epilepticus on EEG, status post cardiac arrest on TTM   History obtained from:  Chart  HPI:                                                                                                                                          Clayton Foster is an 58 y.o. male with a history of type 2 diabetes, coronary artery disease status post 7 stents, chronic back pain with radiculopathy, chronic benzodiazepine and opioid dependency, anxiety, depression, panic disorder, smoking, hypertension, hyperlipidemia and acid reflux.  According to previous notes, the patient lives in [place] with his mother and is on disability. The patient has presented multiple times to [facility] with requests to help quit oxycodone, which he started in 2018 for his back surgery. The patient has expressed on multiple ED visits that he did not want to go on living; however, it was determined he was not a suicide risk. He did not meet criteria for admission and the plan was for outpatient management. His most recent [facility] ED visit was 03/05/19 and he was discharged home with Vistaril and Motrin and a plan for follow-up with [facility].   On 07/16/2019, after going for a walk he had an observed collapse in front of his mother. Fire arrived within 3 minutes and he received defibrillation by an AED twice. EMS arrived and another defibrillation was delivered. They obtained return of spontaneous circulation. Upon arrival to the ED, the patient was not answering questions or following commands. Intubation was required. Cardiology reviewed EKG changes and he did not require emergent catheterization. The plan was for cooling. Acute head CT was negative. He was admitted to [facility] with PCCM. He is currently on Target Temperature Management with an arctic sun to 47 with plans to rewarm at 10pm on 12/6.  Cultures were negative and antibiotics were stopped. Troponin remains elevated (12,349 yesterday and most recently 6,742).The patient remains on a heparin drip as recommended by cardiology.  The patient was placed on continuous EEG monitoring, following the protocol for cardiac patients being cooled with an arctic sun. Status epilepticus was identified and neurology was consulted for management recommendations.   No Known Allergies  MEDICATIONS:  I have reviewed the patient's current medications.    ROS:                                                                                                                                       History obtained from unobtainable from patient due to mental status. Intubated, sedated and paralyzed.  History obtained from chart   Blood pressure 93/65, pulse (!) 58, temperature (!) 91.4 F (33 C), temperature source Bladder, resp. rate 12, height 6\' 2"  (1.88 m), weight 99.9 kg, SpO2 100 %.   General Examination:                                                                                                       Physical Exam  HEENT-  Normocephalic, no lesions, without obvious abnormality.  Normal external eye and conjunctiva.   Cardiovascular- bedside telemetry in cardiac ICU Lungs- Ventilator  Extremities & Skin- Arctic sun in place for cooling protocol  Neurological Examination  Patient is on ventilator and being cooled with an arctic sun. He is on sedation and paralyzed with nimbex.  No spontaneous eye opening, no attempts at speech, no spontaneous movement and not following any commands. Pupils fixed. No corneal response. No response to visual threat. No oculocephalic reflex. No response to noxious stimuli.   Lab Results: Basic Metabolic Panel: Recent Labs  Lab July 23, 2019 1555  07-23-19 2015  23-Jul-2019 2213 06/29/19 0005  06/29/19 0153 06/29/19 0354 06/29/19 0406 06/29/19 0434 06/29/19 0555 06/29/19 0756  NA 137   < > 133*   < > 138 140 140 138 137 138 143 140  K 4.1   < > 4.6   < > 4.4 3.6 3.4* 3.5 3.5 3.5 3.0* 2.7*  CL 104   < > 106  --  107 106 108 110  --   --   --  110  CO2 17*  --  18*  --   --   --   --  18*  --   --   --  21*  GLUCOSE 297*   < > 194*  --  199* 176* 159* 156*  --   --   --  186*  BUN 12   < > 15  --  18 18 17 18   --   --   --  14  CREATININE 1.08   < > 0.93  --  0.70 0.60* 0.70 0.83  --   --   --  0.68  CALCIUM 8.7*  --  8.2*  --   --   --   --  8.4*  --   --   --  9.0  MG  --   --   --   --   --   --   --  1.7  --   --   --   --   PHOS  --   --   --   --   --   --   --  2.3*  --   --   --   --    < > = values in this interval not displayed.    CBC: Recent Labs  Lab Jul 09, 2019 1555  2019/07/09 2015  06/29/19 0153 06/29/19 0354 06/29/19 0406 06/29/19 0434 06/29/19 0555  WBC 11.7*  --  12.3*  --   --  12.2*  --   --   --   NEUTROABS 5.3  --   --   --   --   --   --   --   --   HGB 15.0   < > 14.7   < > 13.9 15.2 13.9 14.3 13.9  HCT 43.8   < > 39.8   < > 41.0 41.8 41.0 42.0 41.0  MCV 95.4  --  90.7  --   --  91.3  --   --   --   PLT 211  --  178  --   --  206  --   --   --    < > = values in this interval not displayed.    Cardiac Enzymes: No results for input(s): CKTOTAL, CKMB, CKMBINDEX, TROPONINI in the last 168 hours.  Lipid Panel: Recent Labs  Lab Jul 09, 2019 1555 07/09/2019 2013  CHOL 149 143  TRIG 118 76  80  HDL 37* 41  CHOLHDL 4.0 3.5  VLDL 24 15  LDLCALC 88 87    Imaging: Ct Head Wo Contrast  Result Date: 2019-07-09 CLINICAL DATA:  Altered level of consciousness. EXAM: CT HEAD WITHOUT CONTRAST TECHNIQUE: Contiguous axial images were obtained from the base of the skull through the vertex without intravenous contrast. COMPARISON:  None. FINDINGS: Brain: No evidence of acute infarction, hemorrhage, hydrocephalus, extra-axial collection or mass lesion/mass  effect. Vascular: No hyperdense vessel or unexpected calcification. Skull: Normal. Negative for fracture or focal lesion. Sinuses/Orbits: Fluid in the nasal cavity in the setting of NG tube and endotracheal intubation. Other: None. IMPRESSION: No acute intracranial finding. Electronically Signed   By: Donzetta Kohut M.D.   On: 07/09/19 16:53   Dg Chest Portable 1 View  Result Date: 2019/07/09 CLINICAL DATA:  Line placement, ET tube placement. EXAM: PORTABLE CHEST 1 VIEW COMPARISON:  2019/07/09 at 4:14 p.m. FINDINGS: Endotracheal tube remains in place approximately 5-1/2 cm above the level of the carina. A gastric tube courses through off the field of the radiograph. Interval placement of right IJ central venous catheter terminating at the upper right atrium. Pacer defibrillator pads overlie the left chest. Cardiomediastinal contours are stable. Lungs are clear. No acute bone finding. IMPRESSION: 1. Endotracheal tube 5-1/2 cm above the level of the carina. 2. Interval placement of right IJ central venous catheter with tip terminating at the upper right atrium. 3. No visible pneumothorax. Electronically Signed   By: Donzetta Kohut M.D.   On: 07-09-2019 18:38   Dg Chest Port 1 View  Result Date: 2019-07-09 Odette Fraction, MD     07-09-19  6:39 PM CVC Procedure Note Tawni LevyRichard W Barcellos [id number] Apr 05, 1961 Procedure: RIJ triple lumen central line Indications: cardiopulmonary arrest, need for multiple infusions Procedure Details Consent: Risks of procedure as well as the alternatives and risks of each were explained to the (patient/caregiver).  Consent for procedure obtained. Time Out: Verified patient identification, verified procedure, site/side was marked, verified correct patient position, special equipment/implants available, medications/allergies/relevent history reviewed, required imaging and test results available.  Performed Drugs:  100 mcg Fentanyl, After sterile prep with chloraprep, drape including  full barrier precautions, U/S in sterile sleeve, hat, mask, sterile gown and gloves, R IJ indentified with u/s and under direct u/s visualization, the IJ cannulated. Seldinger technique used to place line. All 3  Ports aspirated with easy blood return and flushed with sterile saline, sterile caps placed. Line sutured in place and biopatch and sterile dressing applied. Evaluation Hemodynamic Status: BP stable throughout; no ectopy or unstable rhythm during placement O2 sats: stable throughout Patient's Current Condition: stable Complications: No apparent complications Patient did tolerate procedure well. Chest X-ray ordered to verify placement.  CXR: pending, but my personal review is that the line is in the superior SVC in good position. ETT advanced and even so with the pts neck in extension the tip is at the manubrium. Gwynne EdingerPaul C. Harkins, MD PhD 2018-08-23 6:39 PM   Dg Chest Portable 1 View  Result Date: 06/26/2019 CLINICAL DATA:  Tube placement EXAM: PORTABLE CHEST 1 VIEW COMPARISON:  None. FINDINGS: ET tube is approximately 6 cm above carina. There is a nasogastric tube with tip below the field of view. Mild cardiac enlargement. Asymmetric elevation of right hemidiaphragm. Atelectasis noted in the left base. IMPRESSION: 1. Endotracheal tube tip is approximately 6 cm above carina. Tip of the NG tube is below the field of view. 2. Left base atelectasis and asymmetric elevation of right hemidiaphragm. Electronically Signed   By: Signa Kellaylor  Stroud M.D.   On: 2020-07-3118 16:30    Assessment: 58 y.o. male with a history of type 2 diabetes, coronary artery disease status post 7 stents, chronic back pain with radiculopathy, chronic benzodiazepine and opioid dependency, anxiety, depression, panic disorder, smoking, hypertension, hyperlipidemia and acid reflux.  He was admitted yesterday for cardiogenic shock following a witnessed VT/VF out of hospital arrest. He did not require emergent catheterization. He is currently  being cooled with an [place] sun with plans to rewarm at 10pm tonight. Troponin remains elevated and he is on a heparin drip per cardiology. He is admitted under PCCM.   Continuous EEG monitoring revealed status epilepticus. Head CT yesterday on admission had no acute findings. He was treated with 20mg  versed boluses x4 and placed on 30mg /hr versed continuous. A one time dose of Keppra 2,000mg  IV was given. He remains on continuous EEG monitoring.    At 12:15pm his neurologic exam shows no spontaneous eye opening, no attempts at speech, no spontaneous movement and not following any commands. Pupils fixed. No corneal response. No response to visual threat. No oculocephalic reflex. No response to noxious stimuli. Expected findings given he is on sedation and paralyzed with nimbex. He currently remains on ventilator and is still being cooled with an arctic sun.   At 14:50 neurology returned to bedside and patient still in status epilepticus on continuous EEG monitoring. Propofol and depakote added.   Impression: Status Epilepticus in the setting of possible anoxic brain injury secondary to cardiac arrest.   Typically poor prognosis with prologed status epilepticus following cardiac arrest. Possibility the history of  narcotic/benzo use is contributing to withdrawal seizures.  Recommendations: -- 20mg  Versed IV bolus given x4 with Dr. at bedside.  -- After 4th bolus, versed infusion rate increased from 20mg /hr to 30mg /hr -- Kepra 2,000mg  IV once given. Starting Keppra 750mg  BID -- Propofol 122mcg/kg/min bolus started, followed by 30mcg/kg/min continuous drip.  -- Depakote 1,500mg  load, followed by 500mg  TID starting at 2300 -- Continuous EEG monitoring in place.  -- Neurohospitalist today is Dr. and he will continue to follow.  -- Epileptologist Dr. following continous EEG.   80m DNP, FNP-C [facility] Nurse Practitioner     Additional recommendations to  follow from attending neurologist.     Attending Neurohospitalist Addendum Patient seen and examined with APP/Resident. Agree with the history and physical as documented above. Agree with the plan as documented, which I helped formulate. Required multiple boluses of Versed and Versed drip.  Keppra load.  Depakote load, followed by propofol drip, which is currently being titrated.  Remains in status epilepticus. I have independently reviewed the chart, obtained history, review of systems and examined the patient.I have personally reviewed pertinent head/neck/spine imaging (CT/MRI). Please feel free to call with any questions. --- 30m, MD [facility] Pager: [phone] If 7pm to 7am, please call on call as listed on AMION.  CRITICAL CARE ATTESTATION Performed by: Wilford Corner, MD Total critical care time: 75 minutes Critical care time was exclusive of separately billable procedures and treating other patients and/or supervising APPs/Residents/Students Critical care was necessary to treat or prevent imminent or life-threatening deterioration due to status epilepticus This patient is critically ill and at significant risk for neurological worsening and/or death and care requires constant monitoring. Critical care was time spent personally by me on the following activities: development of treatment plan with patient and/or surrogate as well as nursing, discussions with consultants, evaluation of patient's response to treatment, examination of patient, obtaining history from patient or surrogate, ordering and performing treatments and interventions, ordering and review of laboratory studies, ordering and review of radiographic studies, pulse oximetry, re-evaluation of patient's condition, participation in multidisciplinary rounds and medical decision making of high complexity in the care of this patient.

## 2019-06-29 NOTE — Progress Notes (Signed)
eLink Physician-Brief Progress Note Patient Name: TEON HUDNALL DOB: Jul 20, 1961 MRN: [id number]   Date of Service  06/29/2019  HPI/Events of Note  K+ 3.6 and Creatinine = 0.6.   eICU Interventions  Will replace K+.     Intervention Category Major Interventions: Electrolyte abnormality - evaluation and management  Sommer,Steven Eugene 06/29/2019, 12:53 AM

## 2019-06-29 NOTE — Progress Notes (Signed)
CRITICAL VALUE ALERT  Critical Value:  K 2.7  Date & Time Notied:  06/29/2019 0900  Provider Notified: Shearon Stalls, MD  Orders Received/Actions taken: K replacements - see orders

## 2019-06-29 NOTE — Progress Notes (Signed)
  Echocardiogram 2D Echocardiogram has been performed.  Clayton Foster 06/29/2019, 3:46 PM

## 2019-06-29 NOTE — Progress Notes (Signed)
ANTICOAGULATION CONSULT NOTE   Pharmacy Consult for Heparin  Indication: chest pain/ACS  No Known Allergies  Patient Measurements: Height: '6\' 2"'  (188 cm) Weight: 220 lb 3.8 oz (99.9 kg) IBW/kg (Calculated) : 82.2 Heparin Dosing Weight: 90  Vital Signs: Temp: 91.4 F (33 C) (12/06 1200) Temp Source: Bladder (12/06 1200) BP: 93/65 (12/06 1200) Pulse Rate: 58 (12/06 1200)  Labs: Recent Labs    07/21/2019 1555  07/14/2019 2015  07/07/2019 2207  06/29/19 0153 06/29/19 0354 06/29/19 0406 06/29/19 0434 06/29/19 0555 06/29/19 0756 06/29/19 1015 06/29/19 1143  HGB 15.0   < > 14.7   < >  --    < > 13.9 15.2 13.9 14.3 13.9  --   --   --   HCT 43.8   < > 39.8   < >  --    < > 41.0 41.8 41.0 42.0 41.0  --   --   --   PLT 211  --  178  --   --   --   --  206  --   --   --   --   --   --   APTT 29  --  29  --   --   --   --  52*  --   --   --   --   --  65*  LABPROT 15.0  --  15.1  --   --   --   --  14.7  --   --   --   --   --  14.3  INR 1.2  --  1.2  --   --   --   --  1.2  --   --   --   --   --  1.1  HEPARINUNFRC  --   --   --   --   --   --   --  0.15*  --   --   --   --   --  0.28*  CREATININE 1.08   < > 0.93  --   --    < > 0.70 0.83  --   --   --  0.68  --   --   TROPONINIHS 81*  --  6,196*  --  69,678*  --   --   --   --   --   --   --  7,866*  --    < > = values in this interval not displayed.    Estimated Creatinine Clearance: 128.7 mL/min (by C-G formula based on SCr of 0.68 mg/dL).   Medical History: No past medical history on file.  Medications:  No medications prior to admission.   Scheduled:  . artificial tears  1 application Both Eyes L3Y  . chlorhexidine gluconate (MEDLINE KIT)  15 mL Mouth Rinse BID  . Chlorhexidine Gluconate Cloth  6 each Topical Daily  . feeding supplement (PRO-STAT SUGAR FREE 64)  30 mL Per Tube BID  . feeding supplement (VITAL HIGH PROTEIN)  1,000 mL Per Tube Q24H  . fentaNYL (SUBLIMAZE) injection  100 mcg Intravenous Once  . insulin  aspart  2-6 Units Subcutaneous Q4H  . mouth rinse  15 mL Mouth Rinse 10 times per day  . pantoprazole (PROTONIX) IV  40 mg Intravenous QHS  . potassium chloride  40 mEq Per Tube Q4H  . sodium chloride flush  10-40 mL Intracatheter Q12H    Assessment: 58 yo male s/p cardiac  arrest on hypothermia protocol. History of CAD with multiple stents Pharmacy consulted to dose heparin. -hg= 13.9, plt= 206  12/6 update:  Heparin level just below goal Still in cooling phase  No overt bleeding noted.    Goal of Therapy:  Heparin level 0.3-0.7 units/ml Monitor platelets by anticoagulation protocol: Yes   Plan:   Increase heparin to 1000 units/hr Recheck heparin level in am  Erin Hearing PharmD., BCPS Clinical Pharmacist 06/29/2019 1:06 PM

## 2019-06-29 NOTE — Progress Notes (Signed)
vLTM EEG started following spot EEG.  

## 2019-06-29 NOTE — Progress Notes (Signed)
CRITICAL VALUE ALERT  Critical Value: potassium 3.6  Date & Time Notied:  06/29/19 0000  Provider Notified: MD Oletta Darter  Orders Received/Actions taken: IV K ordered

## 2019-06-29 NOTE — Progress Notes (Signed)
Issues with shivering and low HR. Pt HR sustaining at 35 with BP increasing to 180s/100s. Levo off. Dopamine ordered per MD Oletta Darter. ST with PVCs occurred after initiating starting dose of Dopamine, RN then titrated dose to 2.38mcg. HR then resolved to 60s with no ectopy.  Sedation titrated. Counter warming measures taken. Pt shivering resolved.

## 2019-06-29 NOTE — Progress Notes (Signed)
eLink Physician-Brief Progress Note Patient Name: Clayton Foster DOB: 09-09-1960 MRN: [id number]   Date of Service  06/29/2019  HPI/Events of Note  40%/PRVC 20/TV 620/P 5 = 7.522/21.6/95.0  eICU Interventions  Will order: 1. Decrease PRVC rate to 12. 2. Repeat ABG at 6 AM.     Intervention Category Major Interventions: Acid-Base disturbance - evaluation and management;Respiratory failure - evaluation and management  Sommer,Steven Eugene 06/29/2019, 5:01 AM

## 2019-06-29 NOTE — Progress Notes (Signed)
LTM EEG reviewed from 1212 to 1448. Patient continues to be in non convulsive status epilepticus. Notified Dr Rory Percy. Please review final report for details.  Nea Gittens Barbra Sarks

## 2019-06-29 NOTE — Progress Notes (Addendum)
NAME:  Clayton Foster, MRN:  [id number], DOB:  1961-04-10, LOS: 1 ADMISSION DATE:  07/22/2019,  CHIEF COMPLAINT:  Cardiac arrest  Brief History   The patient is a 58 year old gentleman with a history of coronary artery disease with multiple stent placements, chronic back pain, tobacco use disorder, anxiety and depression as well as chronic benzodiazepine and opioid dependency.  He presented with a witnessed out of hospital cardiac arrest which was on 06/24/2019.  He received CPR by the fire department within a few minutes, and multiple shocks were delivered in the field.  He underwent targeted temperature management on the evening of 12/5, and was admitted to heart for further management.  PCCM to admit.  Consults:  Neurology Cardiology  Procedures:  12/5 central line 12/5 arterial line 12/5 intubation  Significant Diagnostic Tests:  12/6 EEG demonstrating status epilepticus  Micro Data:  12/5 respiratory virus panel is negative Sputum culture shows normal flora Urine culture pending 12/5 Covid test negative 12/5 MRSA PCR negative 12/5 blood cultures negative to date.  Antimicrobials:  Ceftriaxone 12/5>>12/6  Interim history/subjective:   This morning Clayton Foster is intubated, deeply sedated, and an EEG was placed results were concerning for status epilepticus. Objective   Blood pressure (!) 141/72, pulse (!) 57, temperature (!) 91.2 F (32.9 C), temperature source Bladder, resp. rate 13, height 6\' 2"  (1.88 m), weight 99.9 kg, SpO2 100 %. CVP:  [2 mmHg-9 mmHg] 3 mmHg  Vent Mode: PRVC FiO2 (%):  [40 %-100 %] 50 % Set Rate:  [12 bmp-20 bmp] 12 bmp Vt Set:  [782[620 mL] 620 mL PEEP:  [5 cmH20] 5 cmH20 Plateau Pressure:  [17 cmH20-19 cmH20] 17 cmH20   Intake/Output Summary (Last 24 hours) at 06/29/2019 0953 Last data filed at 06/29/2019 0930 Gross per 24 hour  Intake 3555.6 ml  Output 4150 ml  Net -594.4 ml   Filed Weights   07/20/2019 1600 07/19/2019 1612 07/04/2019 1854   Weight: 90 kg 90 kg 99.9 kg    Examination: General: Intubated, sedated HENT: ET tube in place, sclera anicteric Lungs: Clear to auscultation bilaterally, sounds with mechanical ventilation are auscultated no wheezes or crackles Cardiovascular: Tachycardic, regular, no murmurs rubs or gallops Abdomen: Soft, nondistended Extremities: No edema Neuro: No purposeful movements, he is deeply sedated MSK: No rashes Lines: Right IJ CVC, left radial arterial line  Assessment & Plan:  Patient is 58 year old gentleman with a history of coronary artery disease who presents with a witnessed out-of-hospital cardiac arrest.  VT/VF witnessed OOH arrest Cardiogenic Shock Titrated offhis dopamine.  Continue with norepinephrine for pressor support. On TTM to 33.  Goal at 10pm 12/5. Plans to rewarm at 10pm 12/6.  ON heparin gtt per cardiology Trending troponins - at 12,000 and haven't peaked yet.  Empiric antibiotics were started in the setting of cardiac arrest. given the cultures are negative, Stop ceftriaxone procalcitonin is also negative.  Status epilepticus Acute encephalopathy Is in status epilepticus, while on continuous EEG.  Plan is for deep sedation with fentanyl and Versed.  He is getting Versed boluses, as well as getting started on Keppra.  Neurology is following appreciate recommendations. Concerned that withdrawal from benzodiazepines, and multiple other prescription substances may be lowering his seizure threshold and precipitating status epilepticus.   Acute Hypoxemic Respiratory Failure Maintain lung protective ventilation I have adjusted his Vt to 7cc/kg Best practice:  Diet: NPO for now Pain/Anxiety/Delirium protocol (if indicated): deeply sedated on fentanyl and versed VAP protocol (if indicated): ordered DVT prophylaxis:  heparin gtt GI prophylaxis: PPI Glucose control: yes Foley Yes Mobility: Bed rest Code Status: Full Family Communication: updated wife at bedside  today.   Disposition: needs ICU   Labs   CBC: Recent Labs  Lab 07/09/2019 1555  07/24/2019 2015  06/29/19 0153 06/29/19 0354 06/29/19 0406 06/29/19 0434 06/29/19 0555  WBC 11.7*  --  12.3*  --   --  12.2*  --   --   --   NEUTROABS 5.3  --   --   --   --   --   --   --   --   HGB 15.0   < > 14.7   < > 13.9 15.2 13.9 14.3 13.9  HCT 43.8   < > 39.8   < > 41.0 41.8 41.0 42.0 41.0  MCV 95.4  --  90.7  --   --  91.3  --   --   --   PLT 211  --  178  --   --  206  --   --   --    < > = values in this interval not displayed.    Basic Metabolic Panel: Recent Labs  Lab 07/01/2019 1555  07/18/2019 2015  07/24/2019 2213 06/29/19 0005 06/29/19 0153 06/29/19 0354 06/29/19 0406 06/29/19 0434 06/29/19 0555 06/29/19 0756  NA 137   < > 133*   < > 138 140 140 138 137 138 143 140  K 4.1   < > 4.6   < > 4.4 3.6 3.4* 3.5 3.5 3.5 3.0* 2.7*  CL 104   < > 106  --  107 106 108 110  --   --   --  110  CO2 17*  --  18*  --   --   --   --  18*  --   --   --  21*  GLUCOSE 297*   < > 194*  --  199* 176* 159* 156*  --   --   --  186*  BUN 12   < > 15  --  18 18 17 18   --   --   --  14  CREATININE 1.08   < > 0.93  --  0.70 0.60* 0.70 0.83  --   --   --  0.68  CALCIUM 8.7*  --  8.2*  --   --   --   --  8.4*  --   --   --  9.0  MG  --   --   --   --   --   --   --  1.7  --   --   --   --   PHOS  --   --   --   --   --   --   --  2.3*  --   --   --   --    < > = values in this interval not displayed.   GFR: Estimated Creatinine Clearance: 128.7 mL/min (by C-G formula based on SCr of 0.68 mg/dL). Recent Labs  Lab 07/03/2019 1555 07/18/2019 2012 07/06/2019 2015 06/29/19 0354  PROCALCITON  --   --  <0.10 0.23  WBC 11.7*  --  12.3* 12.2*  LATICACIDVEN  --  1.8  --   --     Liver Function Tests: Recent Labs  Lab 06/24/2019 1555  AST 80*  ALT 61*  ALKPHOS 94  BILITOT 0.8  PROT 6.2*  ALBUMIN 3.7  No results for input(s): LIPASE, AMYLASE in the last 168 hours. No results for input(s): AMMONIA in the  last 168 hours.  ABG    Component Value Date/Time   PHART 7.426 06/29/2019 0555   PCO2ART 28.8 (L) 06/29/2019 0555   PO2ART 76.0 (L) 06/29/2019 0555   HCO3 19.9 (L) 06/29/2019 0555   TCO2 21 (L) 06/29/2019 0555   ACIDBASEDEF 5.0 (H) 06/29/2019 0555   O2SAT 98.0 06/29/2019 0555     Coagulation Profile: Recent Labs  Lab 07-26-19 1555 July 26, 2019 2015 06/29/19 0354  INR 1.2 1.2 1.2    Cardiac Enzymes: No results for input(s): CKTOTAL, CKMB, CKMBINDEX, TROPONINI in the last 168 hours.  HbA1C: No results found for: HGBA1C  CBG: Recent Labs  Lab 06/29/19 0401 06/29/19 0518 06/29/19 0550 06/29/19 0651 06/29/19 0800  GLUCAP 158* 141* 144* 137* 189*    Critical care time:   The patient is critically ill with multiple organ systems failure and requires high complexity decision making for assessment and support, frequent evaluation and titration of therapies, application of advanced monitoring technologies and extensive interpretation of multiple databases.   Critical Care Time devoted to patient care services described in this note is 57 minutes. This time reflects the time of my personal involvement. This critical care time does not reflect separately billable procedures or procedure time, teaching time or supervisory time of PA/NP/Med student/Med Resident etc but could involve care discussion time.  Leone [facility] 06/29/2019 9:53 AM  Pager: [phone] After hours pager: [phone]

## 2019-06-29 NOTE — Plan of Care (Signed)
EEG with some improvement, but angry looking bursts. Now about 5 sec apart.  Would increase Propofol to 30. Will follow.  -- Keiera Strathman, MD [facility] Pager: [phone] If 7pm to 7am, please call on call as listed on AMION.  

## 2019-06-29 NOTE — Progress Notes (Signed)
eLink Physician-Brief Progress Note Patient Name: Clayton Foster DOB: 1961-06-10 MRN: [id number]   Date of Service  06/29/2019  HPI/Events of Note  K+ = 3.5, PO4--- = 2.3, Mg++ = 1.7, Ca++ = 8.4 which corrects to 8.64 (low) given albumin = 3.7 and Creatinine = 0.83.  eICU Interventions  Will replace K+, PO4---, Mg++ and Ca++.     Intervention Category Major Interventions: Electrolyte abnormality - evaluation and management  Sommer,Steven Eugene 06/29/2019, 5:36 AM

## 2019-06-29 NOTE — Progress Notes (Signed)
Notified Elink of pt lab values as follows: K of 3.5; phosphorus of 2.3; Mg of 1.7. MD Oletta Darter ordered calcium gluconate, magnesium sulfate and potassium phosphate.

## 2019-06-29 NOTE — Procedures (Signed)
ELECTROENCEPHALOGRAM REPORT   Patient: Clayton Foster       Room #: 5Q00Q EEG No. ID: [id number] Age: 58 y.o.        Sex: male Referring Physician: Shearon Stalls Report Date:  06/29/2019        Interpreting Physician: Alexis Goodell  History: Clayton Foster is an 58 y.o. male s/p arrest  Medications:  Nimbex, Fentanyl, Versed, Heparin, Levophed, Insulin,   Conditions of Recording:  This is a 21 channel routine scalp EEG performed with bipolar and monopolar montages arranged in accordance to the international 10/20 system of electrode placement. One channel was dedicated to EKG recording.  The patient is in the intubated, sedated and paralyzed state.   Description:  The background activity is discontinuous. It consists of bursts of generalized polyspike, spike and slow wave activity alternating with periods of attenuation. There are nine episodes of electrographic seizure activity noted.  These periods are characterized by sharp waves developing over the left hemisphere for a few seconds before the sharp waves become more generalized and the generalized sharp activity lasting 30 seconds to a minute per episode before returning to a burst suppression rhythm.  There is no change in clinical activity noted.  Hyperventilation and intermittent photic stimulation were not performed.  IMPRESSION: This is an abnormal electroencephalogram secondary to a burst suppression rhythm with intermittent electrographic seizure activity noted, consisting of buildup of sharp waves over the left hemisphere leading to generalization lasting 30-60 seconds.  No change in clinical activity noted.      Alexis Goodell, MD Neurology [phone] 06/29/2019, 11:55 AM

## 2019-06-29 NOTE — Progress Notes (Signed)
LTM EEG reviewed from 1112 to 1212. Showed patient in non convulsive status epilepticus. Notified Dr Rory Percy. Please review final report for details.   Greg Cratty Barbra Sarks

## 2019-06-29 NOTE — Progress Notes (Signed)
Brief Nutrition Note RD working remotely.  Consult received for enteral/tube feeding initiation and management.  Adult Enteral Nutrition Protocol initiated. Full assessment to follow.  Admitting Dx: Cardiopulmonary arrest ([place]) [I46.9] Encounter for central line placement [Z45.2]  Body mass index is 28.28 kg/m. Pt meets criteria for overweight based on current BMI.  Labs:  Recent Labs  Lab 07/21/2019 2015  06/29/19 0153 06/29/19 0354  06/29/19 0434 06/29/19 0555 06/29/19 0756  NA 133*   < > 140 138   < > 138 143 140  K 4.6   < > 3.4* 3.5   < > 3.5 3.0* 2.7*  CL 106   < > 108 110  --   --   --  110  CO2 18*  --   --  18*  --   --   --  21*  BUN 15   < > 17 18  --   --   --  14  CREATININE 0.93   < > 0.70 0.83  --   --   --  0.68  CALCIUM 8.2*  --   --  8.4*  --   --   --  9.0  MG  --   --   --  1.7  --   --   --   --   PHOS  --   --   --  2.3*  --   --   --   --   GLUCOSE 194*   < > 159* 156*  --   --   --  186*   < > = values in this interval not displayed.    Jacklynn Barnacle, MS, RD, LDN Office: [phone] Pager: [phone] After Hours/Weekend Pager: [phone]

## 2019-06-29 NOTE — Progress Notes (Signed)
eLink Physician-Brief Progress Note Patient Name: Clayton Foster DOB: [birth year] MRN: [id number]   Date of Service  06/29/2019  HPI/Events of Note  Bradycardia - HR = 30's to 50's. BP = 142/70. DDx: 1. Hypothermia vs 2. Nimbex vs 3. Propofol IV infusion (which been off for several hours) vs 4. Combination of several of above.   eICU Interventions  Will order: 1. Dopamine IV infusio. Titrate to MAP >= 65.  2. Wean Norepinephrine IV infusion off as tolerated.  If not able to keep HR > 50 with Dopamine may need to change NMB from Nimbex to another agent.      Intervention Category Major Interventions: Arrhythmia - evaluation and management  Sommer,Steven Eugene 06/29/2019, 4:07 AM

## 2019-06-29 NOTE — Progress Notes (Signed)
EEG completed, results pending. 

## 2019-06-30 ENCOUNTER — Encounter (HOSPITAL_COMMUNITY): Payer: Self-pay

## 2019-06-30 DIAGNOSIS — G40901 Epilepsy, unspecified, not intractable, with status epilepticus: Secondary | ICD-10-CM | POA: Diagnosis not present

## 2019-06-30 DIAGNOSIS — G931 Anoxic brain damage, not elsewhere classified: Secondary | ICD-10-CM | POA: Diagnosis not present

## 2019-06-30 DIAGNOSIS — R579 Shock, unspecified: Secondary | ICD-10-CM

## 2019-06-30 DIAGNOSIS — I469 Cardiac arrest, cause unspecified: Secondary | ICD-10-CM | POA: Diagnosis not present

## 2019-06-30 LAB — GLUCOSE, CAPILLARY
Glucose-Capillary: 142 mg/dL — ABNORMAL HIGH (ref 70–99)
Glucose-Capillary: 144 mg/dL — ABNORMAL HIGH (ref 70–99)
Glucose-Capillary: 148 mg/dL — ABNORMAL HIGH (ref 70–99)
Glucose-Capillary: 148 mg/dL — ABNORMAL HIGH (ref 70–99)
Glucose-Capillary: 156 mg/dL — ABNORMAL HIGH (ref 70–99)
Glucose-Capillary: 159 mg/dL — ABNORMAL HIGH (ref 70–99)
Glucose-Capillary: 162 mg/dL — ABNORMAL HIGH (ref 70–99)

## 2019-06-30 LAB — BASIC METABOLIC PANEL
Anion gap: 10 (ref 5–15)
BUN: 18 mg/dL (ref 6–20)
CO2: 19 mmol/L — ABNORMAL LOW (ref 22–32)
Calcium: 8 mg/dL — ABNORMAL LOW (ref 8.9–10.3)
Chloride: 112 mmol/L — ABNORMAL HIGH (ref 98–111)
Creatinine, Ser: 1.09 mg/dL (ref 0.61–1.24)
GFR calc Af Amer: >60 mL/min (ref >60)
GFR calc non Af Amer: >60 mL/min (ref >60)
Glucose, Bld: 152 mg/dL — ABNORMAL HIGH (ref 70–99)
Potassium: 5.3 mmol/L — ABNORMAL HIGH (ref 3.5–5.1)
Sodium: 141 mmol/L (ref 135–145)

## 2019-06-30 LAB — POCT I-STAT, CHEM 8
BUN: 15 mg/dL (ref 6–20)
BUN: 17 mg/dL (ref 6–20)
BUN: 17 mg/dL (ref 6–20)
BUN: 17 mg/dL (ref 6–20)
BUN: 18 mg/dL (ref 6–20)
BUN: 18 mg/dL (ref 6–20)
BUN: 19 mg/dL (ref 6–20)
Calcium, Ion: 1.16 mmol/L (ref 1.15–1.40)
Calcium, Ion: 1.18 mmol/L (ref 1.15–1.40)
Calcium, Ion: 1.19 mmol/L (ref 1.15–1.40)
Calcium, Ion: 1.19 mmol/L (ref 1.15–1.40)
Calcium, Ion: 1.19 mmol/L (ref 1.15–1.40)
Calcium, Ion: 1.21 mmol/L (ref 1.15–1.40)
Calcium, Ion: 1.22 mmol/L (ref 1.15–1.40)
Chloride: 111 mmol/L (ref 98–111)
Chloride: 111 mmol/L (ref 98–111)
Chloride: 111 mmol/L (ref 98–111)
Chloride: 113 mmol/L — ABNORMAL HIGH (ref 98–111)
Chloride: 113 mmol/L — ABNORMAL HIGH (ref 98–111)
Chloride: 114 mmol/L — ABNORMAL HIGH (ref 98–111)
Chloride: 115 mmol/L — ABNORMAL HIGH (ref 98–111)
Creatinine, Ser: 0.9 mg/dL (ref 0.61–1.24)
Creatinine, Ser: 0.9 mg/dL (ref 0.61–1.24)
Creatinine, Ser: 0.9 mg/dL (ref 0.61–1.24)
Creatinine, Ser: 0.9 mg/dL (ref 0.61–1.24)
Creatinine, Ser: 0.9 mg/dL (ref 0.61–1.24)
Creatinine, Ser: 1 mg/dL (ref 0.61–1.24)
Creatinine, Ser: 1 mg/dL (ref 0.61–1.24)
Glucose, Bld: 142 mg/dL — ABNORMAL HIGH (ref 70–99)
Glucose, Bld: 144 mg/dL — ABNORMAL HIGH (ref 70–99)
Glucose, Bld: 150 mg/dL — ABNORMAL HIGH (ref 70–99)
Glucose, Bld: 152 mg/dL — ABNORMAL HIGH (ref 70–99)
Glucose, Bld: 155 mg/dL — ABNORMAL HIGH (ref 70–99)
Glucose, Bld: 155 mg/dL — ABNORMAL HIGH (ref 70–99)
Glucose, Bld: 157 mg/dL — ABNORMAL HIGH (ref 70–99)
HCT: 39 % (ref 39.0–52.0)
HCT: 42 % (ref 39.0–52.0)
HCT: 42 % (ref 39.0–52.0)
HCT: 42 % (ref 39.0–52.0)
HCT: 43 % (ref 39.0–52.0)
HCT: 43 % (ref 39.0–52.0)
HCT: 43 % (ref 39.0–52.0)
Hemoglobin: 13.3 g/dL (ref 13.0–17.0)
Hemoglobin: 14.3 g/dL (ref 13.0–17.0)
Hemoglobin: 14.3 g/dL (ref 13.0–17.0)
Hemoglobin: 14.3 g/dL (ref 13.0–17.0)
Hemoglobin: 14.6 g/dL (ref 13.0–17.0)
Hemoglobin: 14.6 g/dL (ref 13.0–17.0)
Hemoglobin: 14.6 g/dL (ref 13.0–17.0)
Potassium: 5.1 mmol/L (ref 3.5–5.1)
Potassium: 5.2 mmol/L — ABNORMAL HIGH (ref 3.5–5.1)
Potassium: 5.2 mmol/L — ABNORMAL HIGH (ref 3.5–5.1)
Potassium: 5.2 mmol/L — ABNORMAL HIGH (ref 3.5–5.1)
Potassium: 5.3 mmol/L — ABNORMAL HIGH (ref 3.5–5.1)
Potassium: 5.4 mmol/L — ABNORMAL HIGH (ref 3.5–5.1)
Potassium: 5.4 mmol/L — ABNORMAL HIGH (ref 3.5–5.1)
Sodium: 142 mmol/L (ref 135–145)
Sodium: 142 mmol/L (ref 135–145)
Sodium: 142 mmol/L (ref 135–145)
Sodium: 143 mmol/L (ref 135–145)
Sodium: 144 mmol/L (ref 135–145)
Sodium: 144 mmol/L (ref 135–145)
Sodium: 144 mmol/L (ref 135–145)
TCO2: 19 mmol/L — ABNORMAL LOW (ref 22–32)
TCO2: 21 mmol/L — ABNORMAL LOW (ref 22–32)
TCO2: 21 mmol/L — ABNORMAL LOW (ref 22–32)
TCO2: 21 mmol/L — ABNORMAL LOW (ref 22–32)
TCO2: 21 mmol/L — ABNORMAL LOW (ref 22–32)
TCO2: 22 mmol/L (ref 22–32)
TCO2: 22 mmol/L (ref 22–32)

## 2019-06-30 LAB — CBC
HCT: 43.4 % (ref 39.0–52.0)
Hemoglobin: 14.7 g/dL (ref 13.0–17.0)
MCH: 32.7 pg (ref 26.0–34.0)
MCHC: 33.9 g/dL (ref 30.0–36.0)
MCV: 96.4 fL (ref 80.0–100.0)
Platelets: 201 10*3/uL (ref 150–400)
RBC: 4.5 MIL/uL (ref 4.22–5.81)
RDW: 13.6 % (ref 11.5–15.5)
WBC: 17.6 10*3/uL — ABNORMAL HIGH (ref 4.0–10.5)
nRBC: 0 % (ref 0.0–0.2)

## 2019-06-30 LAB — LEGIONELLA PNEUMOPHILA SEROGP 1 UR AG: L. pneumophila Serogp 1 Ur Ag: NEGATIVE

## 2019-06-30 LAB — URINE CULTURE: Culture: NO GROWTH

## 2019-06-30 LAB — HEPARIN LEVEL (UNFRACTIONATED): Heparin Unfractionated: 0.52 IU/mL (ref 0.30–0.70)

## 2019-06-30 LAB — PHOSPHORUS
Phosphorus: 5 mg/dL — ABNORMAL HIGH (ref 2.5–4.6)
Phosphorus: 3.3 mg/dL (ref 2.5–4.6)

## 2019-06-30 LAB — MAGNESIUM
Magnesium: 1.8 mg/dL (ref 1.7–2.4)
Magnesium: 1.8 mg/dL (ref 1.7–2.4)

## 2019-06-30 LAB — PROCALCITONIN: Procalcitonin: 0.42 ng/mL

## 2019-06-30 MED ORDER — PANTOPRAZOLE SODIUM 40 MG PO PACK
40.0000 mg | PACK | Freq: Every day | ORAL | Status: DC
  Administered 2019-06-30 – 2019-07-04 (×5): 40 mg
  Filled 2019-06-30 (×5): qty 20

## 2019-06-30 MED ORDER — PRO-STAT SUGAR FREE PO LIQD
60.0000 mL | Freq: Three times a day (TID) | ORAL | Status: DC
  Administered 2019-06-30 – 2019-07-05 (×15): 60 mL
  Filled 2019-06-30 (×14): qty 60

## 2019-06-30 MED ORDER — MIDAZOLAM BOLUS VIA INFUSION
10.0000 mg | Freq: Once | INTRAVENOUS | Status: AC
  Administered 2019-06-30: 10 mg via INTRAVENOUS
  Filled 2019-06-30: qty 10

## 2019-06-30 MED ORDER — VITAL HIGH PROTEIN PO LIQD
1000.0000 mL | ORAL | Status: DC
  Administered 2019-07-01 – 2019-07-05 (×5): 1000 mL

## 2019-06-30 NOTE — Progress Notes (Signed)
RT note: patient does not meet SBT criteria this AM due to patient currently on continuous EEG at this time.  Tolerating current ventilator settings well.  Will continue to monitor.

## 2019-06-30 NOTE — Progress Notes (Signed)
ANTICOAGULATION CONSULT NOTE   Pharmacy Consult for Heparin  Indication: chest pain/ACS  No Known Allergies  Patient Measurements: Height: _0  (188 cm) Weight: 230 lb 2.6 oz (104.4 kg) IBW/kg (Calculated) : 82.2 Heparin Dosing Weight: 90  Vital Signs: Temp: 99 F (37.2 C) (12/07 1400) Temp Source: Bladder (12/07 1400) BP: 124/60 (12/07 1400) Pulse Rate: 61 (12/07 1400)  Labs: Recent Labs    07/04/2019 2015  07/08/2019 2207  06/29/19 0354  06/29/19 1015 06/29/19 1143  06/30/19 0412  06/30/19 0747 06/30/19 1009 06/30/19 1210  HGB 14.7   < >  --    < > 15.2   < >  --   --    < > 14.7   < > 14.3 14.6 13.3  HCT 39.8   < >  --    < > 41.8   < >  --   --    < > 43.4   < > 42.0 43.0 39.0  PLT 178  --   --   --  206  --   --   --   --  201  --   --   --   --   APTT 29  --   --   --  52*  --   --  65*  --   --   --   --   --   --   LABPROT 15.1  --   --   --  14.7  --   --  14.3  --   --   --   --   --   --   INR 1.2  --   --   --  1.2  --   --  1.1  --   --   --   --   --   --   HEPARINUNFRC  --   --   --   --  0.15*  --   --  0.28*  --  0.52  --   --   --   --   CREATININE 0.93  --   --    < > 0.83   < >  --   --    < > 1.09   < > 0.90 1.00 1.00  TROPONINIHS 6,196*  --  79,150*  --   --   --  5,697* 9,480*  --   --   --   --   --   --    < > = values in this interval not displayed.    Estimated Creatinine Clearance: 105 mL/min (by C-G formula based on SCr of 1 mg/dL).   Medical History: History reviewed. No pertinent past medical history.  Medications:  Medications Prior to Admission  Medication Sig Dispense Refill Last Dose  . atorvastatin (LIPITOR) 80 MG tablet Take 80 mg by mouth daily.     . cetirizine (ZYRTEC) 10 MG tablet Take 10 mg by mouth daily.     . famotidine (PEPCID) 20 MG tablet Take 20 mg by mouth 2 (two) times daily as needed for indigestion.     Marland Kitchen FLUoxetine (PROZAC) 20 MG capsule Take 20 mg by mouth daily.     Marland Kitchen gabapentin (NEURONTIN) 300 MG capsule  Take 600-900 mg by mouth 3 (three) times daily.     . hydrOXYzine (ATARAX/VISTARIL) 25 MG tablet Take 25 mg by mouth at bedtime.     Marland Kitchen lisinopril-hydrochlorothiazide (ZESTORETIC) 20-25 MG tablet Take 1  tablet by mouth daily.     Marland Kitchen LORazepam (ATIVAN) 1 MG tablet Take 1 mg by mouth every 6 (six) hours as needed for anxiety.     . metFORMIN (GLUCOPHAGE) 1000 MG tablet Take 1,000 mg by mouth 2 (two) times daily.     . methocarbamol (ROBAXIN) 500 MG tablet Take 500 mg by mouth 4 (four) times daily.     . metoprolol tartrate (LOPRESSOR) 25 MG tablet Take 25 mg by mouth 2 (two) times daily.     . Oxycodone HCl 10 MG TABS Take 10 mg by mouth every 6 (six) hours as needed.     . pantoprazole (PROTONIX) 40 MG tablet Take 40 mg by mouth daily.      Scheduled:  . artificial tears  1 application Both Eyes P5Q  . chlorhexidine gluconate (MEDLINE KIT)  15 mL Mouth Rinse BID  . Chlorhexidine Gluconate Cloth  6 each Topical Daily  . feeding supplement (PRO-STAT SUGAR FREE 64)  60 mL Per Tube TID  . [START ON 07/01/2019] feeding supplement (VITAL HIGH PROTEIN)  1,000 mL Per Tube Q24H  . fentaNYL (SUBLIMAZE) injection  100 mcg Intravenous Once  . insulin aspart  2-6 Units Subcutaneous Q4H  . mouth rinse  15 mL Mouth Rinse 10 times per day  . pantoprazole (PROTONIX) IV  40 mg Intravenous QHS  . sodium chloride flush  10-40 mL Intracatheter Q12H    Assessment: 58 yo male s/p cardiac arrest on hypothermia protocol. History of CAD with multiple stents Pharmacy consulted to dose heparin.  Heparin at goal this morning on 1000 units/hr. CBC stable overnight, no overt bleeding noted.  Goal of Therapy:  Heparin level 0.3-0.7 units/ml Monitor platelets by anticoagulation protocol: Yes   Plan:   Continue heparin to 1000 units/hr Recheck heparin level in am  Erin Hearing PharmD., BCPS Clinical Pharmacist 06/30/2019 2:35 PM

## 2019-06-30 NOTE — Progress Notes (Addendum)
Reason for consult: seizure  Subjective: No acute events overnight.  Patient started rewarming yesterday evening around 9 PM.   ROS: Unable to obtain due to poor mental status  Examination  Vital signs in last 24 hours: Temp:  [90.3 F (32.4 C)-97 F (36.1 C)] 97 F (36.1 C) (12/07 1000) Pulse Rate:  [41-74] 58 (12/07 1000) Resp:  [7-19] 19 (12/07 1000) BP: (84-145)/(49-80) 123/62 (12/07 1000) SpO2:  [97 %-100 %] 98 % (12/07 1000) Arterial Line BP: (88-169)/(44-71) 133/54 (12/07 1000) FiO2 (%):  [40 %-50 %] 40 % (12/07 0800) Weight:  [104.4 kg] 104.4 kg (12/07 0500)  General: lying in bed, not in apparent distress CVS: pulse-normal rate and rhythm RS: breathing comfortably, intubated Extremities: normal   Neuro: On Nimbex, propofol, Versed MS: Comatose, does not open eyes to noxious stimuli  CN: Pupils not reacting to light, corneal reflex absent, gag reflex absent, rest of the cranial nerves difficult to assess secondary to intubation  Motor: Does not withdraw to noxious stimuli in all extremities  Basic Metabolic Panel: Recent Labs  Lab 06/29/19 0354  06/29/19 0756 06/29/19 1411  06/29/19 1951  06/30/19 0009 06/30/19 0208 06/30/19 0412 06/30/19 0414 06/30/19 0600  NA 138   < > 140 141   < > 138   < > 142 142 141 142 143  K 3.5   < > 2.7* 4.0   < > 5.3*   < > 5.4* 5.2* 5.3* 5.4* 5.2*  CL 110  --  110 113*  --  111   < > 111 111 112* 111 113*  CO2 18*  --  21* 22  --  19*  --   --   --  19*  --   --   GLUCOSE 156*  --  186* 167*  --  147*   < > 152* 144* 152* 150* 155*  BUN 18  --  14 13  --  16   < > 18 19 18 18 17   CREATININE 0.83  --  0.68 0.70  --  1.07   < > 0.90 0.90 1.09 0.90 0.90  CALCIUM 8.4*  --  9.0 8.5*  --  8.2*  --   --   --  8.0*  --   --   MG 1.7  --   --   --   --   --   --   --   --  1.8  --   --   PHOS 2.3*  --   --   --   --   --   --   --   --  5.0*  --   --    < > = values in this interval not displayed.    CBC: Recent Labs  Lab  07/03/2019 1555  03-Jul-2019 2015  06/29/19 0354  06/30/19 0009 06/30/19 0208 06/30/19 0412 06/30/19 0414 06/30/19 0600  WBC 11.7*  --  12.3*  --  12.2*  --   --   --  17.6*  --   --   NEUTROABS 5.3  --   --   --   --   --   --   --   --   --   --   HGB 15.0   < > 14.7   < > 15.2   < > 14.6 14.3 14.7 14.6 14.3  HCT 43.8   < > 39.8   < > 41.8   < > 43.0 42.0  43.4 43.0 42.0  MCV 95.4  --  90.7  --  91.3  --   --   --  96.4  --   --   PLT 211  --  178  --  206  --   --   --  201  --   --    < > = values in this interval not displayed.     Coagulation Studies: Recent Labs    07/14/2019 1555 06/27/2019 2015 06/29/19 0354 06/29/19 1143  LABPROT 15.0 15.1 14.7 14.3  INR 1.2 1.2 1.2 1.1    Imaging CT head 06/29/2019: No acute intracranial finding.   ASSESSMENT AND PLAN: 58yo M s/p cardiac arrest, was on TTM, now rewarming.  Initial EEG showed nonconvulsive status epilepticus.  Patient was loaded with Keppra after which Depakote was added.  Currently patient on propofol and Versed and EEG continuing to show burst suppression with bursts in the form of generalized periodic epileptiform discharges.   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 -Exam was limited this morning as patient was on Nimbex, propofol and midazolam.  We will reexamine later today.  Recommendations -We will DC EEG as it will not change management at this point. -Continue Keppra and Depakote. -Ok to wean off propofol and versed per ccm.  -Given highly malignant eeg pattern, escalating care by adding more AEDs will most likely not change outcome  -We will discuss neurologic prognosis and recovery with family  -We will consider MRI brain without contrast to assess for extent of hypoxic/anoxic brain injury  -Continue seizure precautions  CRITICAL CARE Performed by: Lora Havens  Total critical care time: 35 minutes  Critical care time was exclusive of separately billable procedures and treating other patients.  Critical care was necessary to treat or prevent imminent or life-threatening deterioration.  Critical care was time spent personally by me on the following activities: development of treatment plan with patient and/or surrogate as well as nursing, discussions with consultants, evaluation of patient's response to treatment, examination of patient, obtaining history from patient or surrogate, ordering and performing treatments and interventions, ordering and review of laboratory studies, ordering and review of radiographic studies, pulse oximetry and re-evaluation of patient's condition.

## 2019-06-30 NOTE — Progress Notes (Signed)
LTM EEG discontinued - no skin breakdown at unhook.   

## 2019-06-30 NOTE — Progress Notes (Addendum)
NAME:  Clayton Foster, MRN:  [id number], DOB:  07-Jun-1961, LOS: 2 ADMISSION DATE:  07/18/2019,  CHIEF COMPLAINT:  Cardiac arrest  Brief History   The patient is a 58 year old gentleman with a history of coronary artery disease with multiple stent placements, chronic back pain, tobacco use disorder, anxiety and depression as well as chronic benzodiazepine and opioid dependency.  He presented with a witnessed out of hospital cardiac arrest which was on 06/25/2019.  He received CPR by the fire department within a few minutes, and multiple shocks were delivered in the field.  He underwent targeted temperature management on the evening of 12/5, and was admitted to heart for further management.  PCCM to admit.  Consults:  Neurology Cardiology  Procedures:  12/5 R IJ central line 12/5 arterial line 12/5 intubation  Significant Diagnostic Tests:  12/6 EEG demonstrating status epilepticus>>12/7 12/6 Echo EF 25-30%. No LVH. G1DD. Global RV severely reduced systolic function. LA normal. RA normal. No pericardial effusion. MV normal, no regurg. TV normal, and no regurg. AV, mild thickening, mild calcification. PV normal, no regurg. Severe biventricular systolic dysfunction. IAS/shunts, no evidence of PFO.  No VSD.  Micro Data:  12/5 respiratory virus panel is negative Sputum culture shows normal flora-reincubated for better growth Urine culture pending 12/5 Covid test negative 12/5 MRSA PCR negative 12/5 blood cultures negative to date.  Antimicrobials:  Ceftriaxone 12/5>>12/6  Interim history/subjective:  Rewarmed. Off nimbex.  On Versed 40 mg/h.  Fentanyl 300 mcg/h.  Propofol 98mcg/kg/min.  Levophed 20 mcg/min.  cEEG discontinued. Objective   Blood pressure 123/62, pulse (!) 58, temperature (!) 97 F (36.1 C), temperature source Bladder, resp. rate 19, height 6\' 2"  (1.88 m), weight 104.4 kg, SpO2 98 %. CVP:  [5 mmHg-13 mmHg] 7 mmHg  Vent Mode: PRVC FiO2 (%):  [40 %-50 %]  40 % Set Rate:  [12 bmp] 12 bmp Vt Set:  [550 mL-570 mL] 570 mL PEEP:  [5 cmH20] 5 cmH20 Plateau Pressure:  [9 cmH20-19 cmH20] 9 cmH20   Intake/Output Summary (Last 24 hours) at 06/30/2019 1057 Last data filed at 06/30/2019 1000 Gross per 24 hour  Intake 5410.69 ml  Output 2100 ml  Net 3310.69 ml   Filed Weights   06/30/2019 1612 07/08/2019 1854 06/30/19 0500  Weight: 90 kg 99.9 kg 104.4 kg    Examination: General: Well-developed, well nourished adult male.  Critically ill-appearing. HENT: Normocephalic, pupils nonreactive. 14mm bilaterally.  Moist mucus membranes Bloody oral secretions suctioned. Neck: No JVD. Trachea midline. No thyromegaly, no lymphadenopathy CV: RRR. S1S2. No MRG. +2 distal pulses Lungs: BBS rhonchi at bases,, FNL, symmetrical.  Vent supported.570/12/5/.40 ABD: +BS x4. SNT/ND. No masses, guarding or rigidity GU: Foley EXT: Flaccid No edema Skin: PWD.  Anterior surfaces in tact. No rashes or lesions Neuro: Unresponsive.  No cough, no gag, no corneal reflexes.   Assessment & Plan:  Patient is 58 year old gentleman with a history of coronary artery disease who presents with a witnessed out-of-hospital cardiac arrest.  VT/VF witnessed OOH arrest Cardiogenic Shock Cardiomyopathy Continue with norepinephrine for pressor support map goal 65. Completed TTM. On heparin gtt per cardiology Troponin peaked at 12,000, now downtrending. Continue to observe off antibiotics Goal mag 2.5, goal potassium 4.5 Would need cardiac catheterization pending neurological recovery.  Status epilepticus Acute encephalopathy Appreciate neurology following and recommendations as below: Continuous EEG discontinued by neurology as same as not changing management at this point. Continue Keppra and Depakote Consider MRI brain to assess for extent of hypoxic/anoxic  injury Continue seizure precautions Poor prognosis for meaningful neurological recovery.   Acute Hypoxemic Respiratory  Failure Continue ventilator support to prevent eminent deterioration and further organ dysfunction from hypoxemia and hypercarbia.   Maintain SpO2 greater than or equal to 90%. Head of bed elevated 30 degrees. Plateau pressures less than 30 cm H20.  Follow chest x-ray, ABG as needed.   Bronchial hygiene. RT/bronchodilator protocol.  Best practice:  Diet: NPO.  Tube feeds Pain/Anxiety/Delirium protocol (if indicated): deeply sedated on fentanyl and versed VAP protocol (if indicated): ordered DVT prophylaxis: heparin gtt GI prophylaxis: PPI Glucose control: Q4H with SSI  Foley Yes Mobility: Bed rest Code Status: Full Family Communication: Will update Disposition: needs ICU   Labs reviewed in EMR       Critical care time:       The patient is critically ill with respiratory failure following cardiac arrest. He/She requires ICU for high complexity decision making, titration of high alert medications, ventilator management, titration of oxygen and interpretation of advanced monitoring.    I personally spent 35 minutes providing critical care services including personally reviewing test results, discussing care with nursing staff/other physicians and completing orders pertaining to this patient.  Time was exclusive to the patient and does not include time spent teaching or in procedures.  Voice recognition software was used in the production of this record.  Errors in interpretation may have been inadvertently missed during review.  Karin Lieu, MSN, AGACNP  [facility]

## 2019-06-30 NOTE — Procedures (Signed)
Patient Name: Clayton Foster  MRN: [id number]  Epilepsy Attending: Lora Havens  Referring Physician/Provider: Dr Brand Males Duration: 06/29/2019 1121 to 06/30/2019 0946  Patient history: 58yo M with cardiac arrest s/p TTM. EEG to evaluate for seizure  Level of alertness: comatose  AEDs during EEG study: keppra, vpa, propofol, versed  Technical aspects: This EEG study was done with scalp electrodes positioned according to the 10-20 International system of electrode placement. Electrical activity was acquired at a sampling rate of 500Hz  and reviewed with a high frequency filter of 70Hz  and a low frequency filter of 1Hz . EEG data were recorded continuously and digitally stored.   DESCRIPTION: EEG initially showed non convulsive status epilepticus. Concomitant EEG showed generalized sharply contoured 3-5hz  theta-delta slowing. Patient was started on keppra followed by valproic acid and then propofol as well as versed. Around 1500, EEG showed generalized suppression. Gradually, EEG evolved into burst suppression with 5-6 seconds of generalized suppression and bursts of generalized periodic epileptiform discharges lasting 1-3 seconds. Hyperventilation and photic stimulation were not performed.  ABNORMALITY - Non convulsive status epilepticus, generalized - Burst suppression, generalized - Generalized periodic epileptiform discharges  IMPRESSION: This study initially showed non- convulsive status epilepticus. After starting AEDs, EEG showed evidence of generalized epileptogenicity as well as profound diffuse encephalopathy.  This EEG pattern in the setting of cardiac arrest can be suggestive of diffuse hypoxic/anoxic brain injury.  [name]

## 2019-06-30 NOTE — Progress Notes (Signed)
Initial Nutrition Assessment  DOCUMENTATION CODES:   Not applicable  INTERVENTION:   Tube Feeding:  Vital High Protein at 20 ml/hr Pro-Stat 60 mL TID Provides 1080 kcals, 132 g of protein and 403 mL of free water Meets 100% protein needs, 102% calorie needs  TF regimen and propofol at current rate providing 2030 total kcal/day (102 % of kcal needs)   NUTRITION DIAGNOSIS:   Inadequate oral intake related to acute illness as evidenced by NPO status.  GOAL:   Patient will meet greater than or equal to 90% of their needs  MONITOR:   Vent status, TF tolerance, Labs, Weight trends  REASON FOR ASSESSMENT:   Consult, Ventilator Enteral/tube feeding initiation and management  ASSESSMENT:   58 yo male admitted post cardiac arrest requiring intubation, cardiogenic shock, acute encephalopathy with status epilepticus. No PMH on file    12/05 Cardiac Arrest  TTM 33 degrees Patient is currently intubated on ventilator support, paralyzed on nimbex, sedated with fentanyl, versed and propofol. Requiring levophed MV: 6.3 L/min Temp (24hrs), Avg:94 F (34.4 C), Min:91 F (32.8 C), Max:99 F (37.2 C)  Propofol: 36 ml/hr (950 kcals in 24 hours)  Adult Tube Feeding Protocol ordered yesterday  Unable to obtain diet and weight history from patient at this time  Admit weight 99.9 kg; current wt 104.4 kg. Net+2.5 L since admission  Labs: potassium 5.2, Creatinine wdl, phosphorus 5.0 Meds: NS at 50 ml/hr  Diet Order:   Diet Order    None      EDUCATION NEEDS:   Not appropriate for education at this time  Skin:  Skin Assessment: Reviewed RN Assessment  Last BM:  no documented BM  Height:   Ht Readings from Last 1 Encounters:  07/11/2019 6\' 2"  (1.88 m)    Weight:   Wt Readings from Last 1 Encounters:  06/30/19 104.4 kg    Ideal Body Weight:  86.4 kg  BMI:  Body mass index is 29.55 kg/m.  Estimated Nutritional Needs:   Kcal:  1990 kcals  Protein:  130-160  g  Fluid:  1.9 L   [employer] MS, RDN, LDN, CNSC [phone] Pager  [phone] Weekend/On-Call Pager

## 2019-07-01 ENCOUNTER — Inpatient Hospital Stay (HOSPITAL_COMMUNITY): Payer: Medicaid Other

## 2019-07-01 LAB — BASIC METABOLIC PANEL
Anion gap: 6 (ref 5–15)
BUN: 13 mg/dL (ref 6–20)
CO2: 21 mmol/L — ABNORMAL LOW (ref 22–32)
Calcium: 8.3 mg/dL — ABNORMAL LOW (ref 8.9–10.3)
Chloride: 117 mmol/L — ABNORMAL HIGH (ref 98–111)
Creatinine, Ser: 0.97 mg/dL (ref 0.61–1.24)
GFR calc Af Amer: >60 mL/min (ref >60)
GFR calc non Af Amer: >60 mL/min (ref >60)
Glucose, Bld: 166 mg/dL — ABNORMAL HIGH (ref 70–99)
Potassium: 4.5 mmol/L (ref 3.5–5.1)
Sodium: 144 mmol/L (ref 135–145)

## 2019-07-01 LAB — GLUCOSE, CAPILLARY
Glucose-Capillary: 132 mg/dL — ABNORMAL HIGH (ref 70–99)
Glucose-Capillary: 151 mg/dL — ABNORMAL HIGH (ref 70–99)
Glucose-Capillary: 166 mg/dL — ABNORMAL HIGH (ref 70–99)
Glucose-Capillary: 143 mg/dL — ABNORMAL HIGH (ref 70–99)
Glucose-Capillary: 145 mg/dL — ABNORMAL HIGH (ref 70–99)
Glucose-Capillary: 173 mg/dL — ABNORMAL HIGH (ref 70–99)

## 2019-07-01 LAB — HEPARIN LEVEL (UNFRACTIONATED)
Heparin Unfractionated: 0.19 IU/mL — ABNORMAL LOW (ref 0.30–0.70)
Heparin Unfractionated: 0.26 IU/mL — ABNORMAL LOW (ref 0.30–0.70)

## 2019-07-01 LAB — CBC
HCT: 39.7 % (ref 39.0–52.0)
Hemoglobin: 13.2 g/dL (ref 13.0–17.0)
MCH: 33 pg (ref 26.0–34.0)
MCHC: 33.2 g/dL (ref 30.0–36.0)
MCV: 99.3 fL (ref 80.0–100.0)
Platelets: 163 10*3/uL (ref 150–400)
RBC: 4 MIL/uL — ABNORMAL LOW (ref 4.22–5.81)
RDW: 13.9 % (ref 11.5–15.5)
WBC: 10.6 10*3/uL — ABNORMAL HIGH (ref 4.0–10.5)
nRBC: 0 % (ref 0.0–0.2)

## 2019-07-01 LAB — PHOSPHORUS
Phosphorus: 3.1 mg/dL (ref 2.5–4.6)
Phosphorus: 2.4 mg/dL — ABNORMAL LOW (ref 2.5–4.6)

## 2019-07-01 LAB — MAGNESIUM
Magnesium: 1.9 mg/dL (ref 1.7–2.4)
Magnesium: 2 mg/dL (ref 1.7–2.4)

## 2019-07-01 LAB — VALPROIC ACID LEVEL: Valproic Acid Lvl: 45 ug/mL — ABNORMAL LOW (ref 50.0–100.0)

## 2019-07-01 LAB — HEMOGLOBIN AND HEMATOCRIT, BLOOD
HCT: 37.9 % — ABNORMAL LOW (ref 39.0–52.0)
Hemoglobin: 12.8 g/dL — ABNORMAL LOW (ref 13.0–17.0)

## 2019-07-01 LAB — TRIGLYCERIDES: Triglycerides: 325 mg/dL — ABNORMAL HIGH (ref <150)

## 2019-07-01 MED ORDER — ACETAMINOPHEN 160 MG/5ML PO SOLN
650.0000 mg | ORAL | Status: DC | PRN
  Administered 2019-07-02 – 2019-07-05 (×7): 650 mg
  Filled 2019-07-01 (×7): qty 20.3

## 2019-07-01 MED ORDER — SODIUM CHLORIDE 0.9 % IV SOLN
1.0000 g | INTRAVENOUS | Status: DC
  Filled 2019-07-01: qty 10

## 2019-07-01 MED ORDER — CEFAZOLIN SODIUM-DEXTROSE 2-4 GM/100ML-% IV SOLN
2.0000 g | Freq: Three times a day (TID) | INTRAVENOUS | Status: DC
  Administered 2019-07-01 – 2019-07-05 (×12): 2 g via INTRAVENOUS
  Filled 2019-07-01 (×18): qty 100

## 2019-07-01 MED ORDER — NOREPINEPHRINE 16 MG/250ML-% IV SOLN
0.0000 ug/min | INTRAVENOUS | Status: DC
  Administered 2019-07-01: 17 ug/min via INTRAVENOUS
  Filled 2019-07-01 (×2): qty 250

## 2019-07-01 MED ORDER — MAGNESIUM SULFATE 2 GM/50ML IV SOLN
2.0000 g | Freq: Once | INTRAVENOUS | Status: AC
  Administered 2019-07-01: 11:00:00 2 g via INTRAVENOUS
  Filled 2019-07-01: qty 50

## 2019-07-01 NOTE — Progress Notes (Signed)
ANTICOAGULATION CONSULT NOTE   Pharmacy Consult for Heparin  Indication: chest pain/ACS  No Known Allergies  Patient Measurements: Height: 6\' 2"  (188 cm) Weight: 236 lb 15.9 oz (107.5 kg) IBW/kg (Calculated) : 82.2 Heparin Dosing Weight: 90  Vital Signs: Temp: 98.6 F (37 C) (12/08 0400) Temp Source: Bladder (12/08 0400) BP: 130/61 (12/08 0530) Pulse Rate: 81 (12/08 0530)  Labs: Recent Labs    05-Jul-2019 2015  07/20/2019 2207  06/29/19 0354  06/29/19 1015 06/29/19 1143  06/30/19 0412  06/30/19 1009 06/30/19 1210 07/01/19 0353  HGB 14.7   < >  --    < > 15.2   < >  --   --    < > 14.7   < > 14.6 13.3 13.2  HCT 39.8   < >  --    < > 41.8   < >  --   --    < > 43.4   < > 43.0 39.0 39.7  PLT 178  --   --   --  206  --   --   --   --  201  --   --   --  163  APTT 29  --   --   --  52*  --   --  65*  --   --   --   --   --   --   LABPROT 15.1  --   --   --  14.7  --   --  14.3  --   --   --   --   --   --   INR 1.2  --   --   --  1.2  --   --  1.1  --   --   --   --   --   --   HEPARINUNFRC  --   --   --    < > 0.15*  --   --  0.28*  --  0.52  --   --   --  0.19*  CREATININE 0.93  --   --    < > 0.83   < >  --   --    < > 1.09   < > 1.00 1.00 0.97  TROPONINIHS 6,196*  --  81,829*  --   --   --  9,371* 6,967*  --   --   --   --   --   --    < > = values in this interval not displayed.    Estimated Creatinine Clearance: 109.7 mL/min (by C-G formula based on SCr of 0.97 mg/dL).  Assessment: 58 yo male with h/o CAD/PCIs, s/p cardiac arrest and hypothermia protocol, now rewarmed, for heparin    Goal of Therapy:  Heparin level 0.3-0.7 units/ml Monitor platelets by anticoagulation protocol: Yes   Plan:   Increase Heparin 1300 units/hr Check heparin level in 8 hours.   Caryl Pina  07/01/2019 5:47 AM

## 2019-07-01 NOTE — Progress Notes (Addendum)
Reason for consult: seizure  Subjective:No clinical seizure overnight. Over breathing the vent.   ROS: Unable to obtain due to poor mental status  Examination  Vital signs in last 24 hours: Temp:  [97.3 F (36.3 C)-99 F (37.2 C)] 99 F (37.2 C) (12/08 0800) Pulse Rate:  [57-86] 72 (12/08 0900) Resp:  [11-22] 11 (12/08 0900) BP: (71-152)/(49-67) 152/67 (12/08 0900) SpO2:  [95 %-100 %] 100 % (12/08 0900) Arterial Line BP: (92-192)/(40-63) 192/63 (12/08 0900) FiO2 (%):  [40 %] 40 % (12/08 0753) Weight:  [107.5 kg] 107.5 kg (12/08 0409)  General: lying in bed, not in apparent distress CVS: pulse-normal rate and rhythm RS: breathing comfortably, intubated Extremities: normal   Neuro: Off sedation  MS: Comatose, does not open eyes to noxious stimuli  CN: Pupils not reacting to light, corneal reflex absent, gag reflex absent, rest of the cranial nerves difficult to assess secondary to intubation  Motor: Does not withdraw to noxious stimuli in all extremities  Basic Metabolic Panel: Recent Labs  Lab 06/29/19 0354  06/29/19 0756 06/29/19 1411  06/29/19 1951  06/30/19 0412  06/30/19 0600 06/30/19 0747 06/30/19 1009 06/30/19 1210 06/30/19 1700 07/01/19 0353  NA 138   < > 140 141   < > 138   < > 141   < > 143 144 144 144  --  144  K 3.5   < > 2.7* 4.0   < > 5.3*   < > 5.3*   < > 5.2* 5.3* 5.2* 5.1  --  4.5  CL 110  --  110 113*  --  111   < > 112*   < > 113* 113* 114* 115*  --  117*  CO2 18*  --  21* 22  --  19*  --  19*  --   --   --   --   --   --  21*  GLUCOSE 156*  --  186* 167*  --  147*   < > 152*   < > 155* 157* 142* 155*  --  166*  BUN 18  --  14 13  --  16   < > 18   < > 17 17 17 15   --  13  CREATININE 0.83  --  0.68 0.70  --  1.07   < > 1.09   < > 0.90 0.90 1.00 1.00  --  0.97  CALCIUM 8.4*  --  9.0 8.5*  --  8.2*  --  8.0*  --   --   --   --   --   --  8.3*  MG 1.7  --   --   --   --   --   --  1.8  --   --   --   --   --  1.8 1.9  PHOS 2.3*  --   --   --   --    --   --  5.0*  --   --   --   --   --  3.3 3.1   < > = values in this interval not displayed.    CBC: Recent Labs  Lab 07/15/2019 1555  07/02/2019 2015  06/29/19 0354  06/30/19 0412  06/30/19 0600 06/30/19 0747 06/30/19 1009 06/30/19 1210 07/01/19 0353  WBC 11.7*  --  12.3*  --  12.2*  --  17.6*  --   --   --   --   --  10.6*  NEUTROABS 5.3  --   --   --   --   --   --   --   --   --   --   --   --   HGB 15.0   < > 14.7   < > 15.2   < > 14.7   < > 14.3 14.3 14.6 13.3 13.2  HCT 43.8   < > 39.8   < > 41.8   < > 43.4   < > 42.0 42.0 43.0 39.0 39.7  MCV 95.4  --  90.7  --  91.3  --  96.4  --   --   --   --   --  99.3  PLT 211  --  178  --  206  --  201  --   --   --   --   --  163   < > = values in this interval not displayed.     Coagulation Studies: Recent Labs    07/22/2019 1555 07/11/2019 2015 06/29/19 0354 06/29/19 1143  LABPROT 15.0 15.1 14.7 14.3  INR 1.2 1.2 1.2 1.1    Imaging CT head 07/20/2019: No acute intracranial finding.  ASSESSMENT AND PLAN: 58yo M s/p cardiac arrest, was on TTM, now rewarming.  Initial EEG showed nonconvulsive status epilepticus.  Patient was loaded with Keppra after which Depakote was added.  Currently patient on propofol and Versed and EEG continuing to show burst suppression with bursts in the form of generalized periodic epileptiform discharges.   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 -We will discuss neurologic prognosis and recovery with family  -Continue Keppra and Depakote. - MRI brain without contrast to assess for extent of hypoxic/anoxic brain injury pending -Continue seizure precautions  CRITICAL CARE Performed by: Lora Havens  Total critical care  time: 35 minutes  Critical care time was exclusive of separately billable procedures and treating other patients.  Critical care was necessary to treat or prevent imminent or life-threatening deterioration.  Critical care was time spent personally by me on the following activities: development of treatment plan with patient and/or surrogate as well as nursing, discussions with consultants, evaluation of patient's response to treatment, examination of patient, obtaining history from patient or surrogate, ordering and performing treatments and interventions, ordering and review of laboratory studies, ordering and review of radiographic studies, pulse oximetry and re-evaluation of patient's condition.  I spoke with patient's girlfriend at bedside around 1330 today.  I explained to her that patient EEG showed highly malignant features as well as his neurologic exam showed evidence of brain damage.  I discussed that we are still awaiting the MRI however if the MRI also shows anoxic brain injury, the chances of neurologic recovery will be very poor.  Patient's girlfriend became tearful and said they were going to get married soon.  She did state that he would not have wanted to live like this.  She said she will talk to patient's mom (legal guardian) about further care after MRI findings.

## 2019-07-01 NOTE — Progress Notes (Signed)
eLink Physician-Brief Progress Note Patient Name: Clayton Foster DOB: 12-Apr-1961 MRN: [id number]   Date of Service  07/01/2019  HPI/Events of Note  Temp 99  eICU Interventions  PRN Tylenol order entered.        [name] [name] 07/01/2019, 8:45 PM

## 2019-07-01 NOTE — Progress Notes (Signed)
ANTICOAGULATION CONSULT NOTE   Pharmacy Consult for Heparin  Indication: chest pain/ACS  No Known Allergies  Patient Measurements: Height: 6' 2" (188 cm) Weight: 236 lb 15.9 oz (107.5 kg) IBW/kg (Calculated) : 82.2 Heparin Dosing Weight: 90  Vital Signs: Temp: 98.8 F (37.1 C) (12/08 0700) Temp Source: Bladder (12/08 0700) BP: 143/64 (12/08 0700) Pulse Rate: 79 (12/08 0700)  Labs: Recent Labs    07/15/2019 2015  07/10/2019 2207  06/29/19 0354  06/29/19 1015 06/29/19 1143  06/30/19 0412  06/30/19 1009 06/30/19 1210 07/01/19 0353  HGB 14.7   < >  --    < > 15.2   < >  --   --    < > 14.7   < > 14.6 13.3 13.2  HCT 39.8   < >  --    < > 41.8   < >  --   --    < > 43.4   < > 43.0 39.0 39.7  PLT 178  --   --   --  206  --   --   --   --  201  --   --   --  163  APTT 29  --   --   --  52*  --   --  65*  --   --   --   --   --   --   LABPROT 15.1  --   --   --  14.7  --   --  14.3  --   --   --   --   --   --   INR 1.2  --   --   --  1.2  --   --  1.1  --   --   --   --   --   --   HEPARINUNFRC  --   --   --    < > 0.15*  --   --  0.28*  --  0.52  --   --   --  0.19*  CREATININE 0.93  --   --    < > 0.83   < >  --   --    < > 1.09   < > 1.00 1.00 0.97  TROPONINIHS 6,196*  --  62,229*  --   --   --  7,989* 2,119*  --   --   --   --   --   --    < > = values in this interval not displayed.    Estimated Creatinine Clearance: 109.7 mL/min (by C-G formula based on SCr of 0.97 mg/dL).   Medical History: History reviewed. No pertinent past medical history.  Medications:  Medications Prior to Admission  Medication Sig Dispense Refill Last Dose  . atorvastatin (LIPITOR) 80 MG tablet Take 80 mg by mouth daily.     . cetirizine (ZYRTEC) 10 MG tablet Take 10 mg by mouth daily.     . famotidine (PEPCID) 20 MG tablet Take 20 mg by mouth 2 (two) times daily as needed for indigestion.     Marland Kitchen FLUoxetine (PROZAC) 20 MG capsule Take 20 mg by mouth daily.     Marland Kitchen gabapentin (NEURONTIN) 300 MG  capsule Take 600-900 mg by mouth 3 (three) times daily.     . hydrOXYzine (ATARAX/VISTARIL) 25 MG tablet Take 25 mg by mouth at bedtime.     Marland Kitchen lisinopril-hydrochlorothiazide (ZESTORETIC) 20-25 MG tablet Take 1 tablet by mouth  daily.     Marland Kitchen LORazepam (ATIVAN) 1 MG tablet Take 1 mg by mouth every 6 (six) hours as needed for anxiety.     . metFORMIN (GLUCOPHAGE) 1000 MG tablet Take 1,000 mg by mouth 2 (two) times daily.     . methocarbamol (ROBAXIN) 500 MG tablet Take 500 mg by mouth 4 (four) times daily.     . metoprolol tartrate (LOPRESSOR) 25 MG tablet Take 25 mg by mouth 2 (two) times daily.     . Oxycodone HCl 10 MG TABS Take 10 mg by mouth every 6 (six) hours as needed.     . pantoprazole (PROTONIX) 40 MG tablet Take 40 mg by mouth daily.      Scheduled:  . artificial tears  1 application Both Eyes A0T  . chlorhexidine gluconate (MEDLINE KIT)  15 mL Mouth Rinse BID  . Chlorhexidine Gluconate Cloth  6 each Topical Daily  . feeding supplement (PRO-STAT SUGAR FREE 64)  60 mL Per Tube TID  . feeding supplement (VITAL HIGH PROTEIN)  1,000 mL Per Tube Q24H  . fentaNYL (SUBLIMAZE) injection  100 mcg Intravenous Once  . insulin aspart  2-6 Units Subcutaneous Q4H  . mouth rinse  15 mL Mouth Rinse 10 times per day  . pantoprazole sodium  40 mg Per Tube QHS  . sodium chloride flush  10-40 mL Intracatheter Q12H    Assessment: 58 yo male s/p cardiac arrest on hypothermia protocol. History of CAD with multiple stents Pharmacy consulted to dose heparin.  Heparin level still low this morning at 0.26. No bleeding noted.   Goal of Therapy:  Heparin level 0.3-0.7 units/ml Monitor platelets by anticoagulation protocol: Yes   Plan:   Increase heparin to 1500 units/hr Recheck heparin level in am  Erin Hearing PharmD., BCPS Clinical Pharmacist 07/01/2019 7:51 AM

## 2019-07-01 NOTE — Progress Notes (Signed)
RT note- Patient to and from MRI, remains on full support.

## 2019-07-01 NOTE — Progress Notes (Signed)
NAME:  Clayton Foster, MRN:  [id number], DOB:  05/04/1961, LOS: 3 ADMISSION DATE:  02-Jul-2019,  CHIEF COMPLAINT:  Cardiac arrest  Brief History   The patient is a 58 year old gentleman with a history of coronary artery disease with multiple stent placements, chronic back pain, tobacco use disorder, anxiety and depression as well as chronic benzodiazepine and opioid dependency.  He presented with a witnessed out of hospital cardiac arrest which was on Jul 02, 2019.  He received CPR by the fire department within a few minutes, and multiple shocks were delivered in the field.  He underwent targeted temperature management on the evening of 12/5, and was admitted to heart for further management.  PCCM to admit.  Consults:  Neurology Cardiology  Procedures:  12/5 R IJ central line 12/5 arterial line 12/5 intubation  Significant Diagnostic Tests:  12/6 EEG demonstrating status epilepticus>>12/7 12/6 Echo EF 25-30%. No LVH. G1DD. Global RV severely reduced systolic function. LA normal. RA normal. No pericardial effusion. MV normal, no regurg. TV normal, and no regurg. AV, mild thickening, mild calcification. PV normal, no regurg. Severe biventricular systolic dysfunction. IAS/shunts, no evidence of PFO.  No VSD.  Micro Data:  12/5 respiratory virus panel is negative 12/5 Sputum culture>abundant staph aureus   12/5 Urine culture> NGTD  12/5 Covid test negative 12/5 MRSA PCR negative 12/5 blood cultures negative to date.  Antimicrobials:  Ceftriaxone 12/5>>12/6  Interim history/subjective:  Rewarmed. Off nimbex and all sedation.  Poor neuro exam.  Levophed 17 mcg/min.   Objective   Blood pressure (!) 152/67, pulse 72, temperature 99 F (37.2 C), temperature source Bladder, resp. rate 11, height 6\' 2"  (1.88 m), weight 107.5 kg, SpO2 100 %. CVP:  [6 mmHg-9 mmHg] 9 mmHg  Vent Mode: PRVC FiO2 (%):  [40 %] 40 % Set Rate:  [12 bmp] 12 bmp Vt Set:  [570 mL] 570 mL PEEP:  [5  cmH20] 5 cmH20 Plateau Pressure:  [10 cmH20-19 cmH20] 18 cmH20   Intake/Output Summary (Last 24 hours) at 07/01/2019 0939 Last data filed at 07/01/2019 0800 Gross per 24 hour  Intake 5472.55 ml  Output 4200 ml  Net 1272.55 ml   Filed Weights   2019/07/02 1854 06/30/19 0500 07/01/19 0409  Weight: 99.9 kg 104.4 kg 107.5 kg    Examination: General: Well-developed, well nourished adult male.  Critically ill-appearing. HENT: Normocephalic, pupils nonreactive. 65mm bilaterally. Ocular bobbing noted. Moist mucus membranes Neck: No JVD. Trachea midline.  CV: RRR. S1S2. No MRG. +2 distal pulses Lungs: BBS rhonchi at bases, FNL, symmetrical.  Vent supported. 570/12/5/.40 ABD: +BS x4. SNT/ND. No masses, guarding or rigidity GU: Foley EXT: Flaccid. No edema Skin: PWD.  Anterior surfaces in tact. No rashes or lesions Neuro: Unresponsive. No cough, no gag, no corneal reflexes.   Assessment & Plan:  Patient is 58 year old gentleman with a history of coronary artery disease who presents with a witnessed out-of-hospital cardiac arrest.  VT/VF witnessed OOH arrest Cardiogenic Shock Cardiomyopathy Continue with norepinephrine for pressor support map goal 65. Completed TTM. On heparin gtt per cardiology Goal mag 2.5, goal potassium 4.5 Would need cardiac catheterization pending neurological recovery.  Status epilepticus Acute encephalopathy likely with anoxic injury  Appreciate neurology following  Continue Keppra and Depakote Consider MRI brain to assess for extent of hypoxic/anoxic injury Continue seizure precautions Hold all sedation for SAT/neuro exam  Poor prognosis for meaningful neurological recovery.   Acute Hypoxemic Respiratory Failure MSSA in sputum  Continue ventilator support to prevent eminent deterioration and further  organ dysfunction from hypoxemia and hypercarbia.   Maintain SpO2 greater than or equal to 90%. Head of bed elevated 30 degrees. Plateau pressures less than  30 cm H20.  Trial SBT now that he is off sedation.  Follow chest x-ray, ABG as needed.   Bronchial hygiene. RT/bronchodilator protocol. Cefazolin per pharmacy    Best practice:  Diet: NPO.  Tube feeds Pain/Anxiety/Delirium protocol (if indicated): deeply sedated on fentanyl and versed VAP protocol (if indicated): ordered DVT prophylaxis: heparin gtt GI prophylaxis: PPI Glucose control: Q4H with SSI  Foley Yes Mobility: Bed rest Code Status: Full Family Communication: Will update Disposition: needs ICU   Labs reviewed in EMR       Critical care time:      The patient is critically ill with respiratory failure following cardiac arrest. He/She requires ICU for high complexity decision making, titration of high alert medications, ventilator management, titration of oxygen and interpretation of advanced monitoring.    I personally spent 35 minutes providing critical care services including personally reviewing test results, discussing care with nursing staff/other physicians and completing orders pertaining to this patient.  Time was exclusive to the patient and does not include time spent teaching or in procedures.  Voice recognition software was used in the production of this record.  Errors in interpretation may have been inadvertently missed during review.  Karin Lieu, MSN, AGACNP  [facility]

## 2019-07-01 NOTE — Progress Notes (Signed)
Pharmacy Antibiotic Note  Clayton Foster is a 58 y.o. male admitted on 07/01/19 with cardiac arrest.  Pharmacy has been consulted for cefazolin dosing.  Patient now s/p TTM protocol now growing abundant staph aureus in respiratory culture. He did receive ceftriaxone on admit.   Tmax 99 overnight, wbc slightly above normal at 10.6, LA 1.8.   Plan: Cefazolin 2g q8 hours  Height: 6\' 2"  (188 cm) Weight: 236 lb 15.9 oz (107.5 kg) IBW/kg (Calculated) : 82.2  Temp (24hrs), Avg:98.6 F (37 C), Min:97.3 F (36.3 C), Max:99 F (37.2 C)  Recent Labs  Lab July 01, 2019 1555  2019-07-01 2012 2019-07-01 2015  06/29/19 0354  06/30/19 0412  06/30/19 0600 06/30/19 0747 06/30/19 1009 06/30/19 1210 07/01/19 0353  WBC 11.7*  --   --  12.3*  --  12.2*  --  17.6*  --   --   --   --   --  10.6*  CREATININE 1.08   < >  --  0.93   < > 0.83   < > 1.09   < > 0.90 0.90 1.00 1.00 0.97  LATICACIDVEN  --   --  1.8  --   --   --   --   --   --   --   --   --   --   --    < > = values in this interval not displayed.    Estimated Creatinine Clearance: 109.7 mL/min (by C-G formula based on SCr of 0.97 mg/dL).    No Known Allergies  Thank you for allowing pharmacy to be a part of this patient's care.  Erin Hearing PharmD., BCPS Clinical Pharmacist 07/01/2019 10:03 AM

## 2019-07-01 NOTE — Progress Notes (Signed)
Wasted medications with Robinette Haines RN in stericycle:  Versed: Wasted 200 mL Fentanyl: Wasted 225 mL

## 2019-07-02 LAB — CULTURE, RESPIRATORY W GRAM STAIN

## 2019-07-02 LAB — BASIC METABOLIC PANEL
Anion gap: 9 (ref 5–15)
BUN: 13 mg/dL (ref 6–20)
CO2: 25 mmol/L (ref 22–32)
Calcium: 9 mg/dL (ref 8.9–10.3)
Chloride: 116 mmol/L — ABNORMAL HIGH (ref 98–111)
Creatinine, Ser: 0.81 mg/dL (ref 0.61–1.24)
GFR calc Af Amer: >60 mL/min (ref >60)
GFR calc non Af Amer: >60 mL/min (ref >60)
Glucose, Bld: 193 mg/dL — ABNORMAL HIGH (ref 70–99)
Potassium: 4.2 mmol/L (ref 3.5–5.1)
Sodium: 150 mmol/L — ABNORMAL HIGH (ref 135–145)

## 2019-07-02 LAB — GLUCOSE, CAPILLARY
Glucose-Capillary: 190 mg/dL — ABNORMAL HIGH (ref 70–99)
Glucose-Capillary: 129 mg/dL — ABNORMAL HIGH (ref 70–99)
Glucose-Capillary: 128 mg/dL — ABNORMAL HIGH (ref 70–99)
Glucose-Capillary: 130 mg/dL — ABNORMAL HIGH (ref 70–99)
Glucose-Capillary: 138 mg/dL — ABNORMAL HIGH (ref 70–99)

## 2019-07-02 LAB — URINE DRUGS OF ABUSE SCREEN W ALC, ROUTINE (REF LAB)
Amphetamines, Urine: NEGATIVE ng/mL
Barbiturate, Ur: NEGATIVE ng/mL
Cannabinoid Quant, Ur: NEGATIVE ng/mL
Cocaine (Metab.): NEGATIVE ng/mL
Ethanol U, Quan: NEGATIVE %
Methadone Screen, Urine: NEGATIVE ng/mL
Opiate Quant, Ur: NEGATIVE ng/mL
Phencyclidine, Ur: NEGATIVE ng/mL
Propoxyphene, Urine: NEGATIVE ng/mL

## 2019-07-02 LAB — CBC
HCT: 35.6 % — ABNORMAL LOW (ref 39.0–52.0)
Hemoglobin: 12.2 g/dL — ABNORMAL LOW (ref 13.0–17.0)
MCH: 34 pg (ref 26.0–34.0)
MCHC: 34.3 g/dL (ref 30.0–36.0)
MCV: 99.2 fL (ref 80.0–100.0)
Platelets: 124 10*3/uL — ABNORMAL LOW (ref 150–400)
RBC: 3.59 MIL/uL — ABNORMAL LOW (ref 4.22–5.81)
RDW: 14.3 % (ref 11.5–15.5)
WBC: 6.6 10*3/uL (ref 4.0–10.5)
nRBC: 0 % (ref 0.0–0.2)

## 2019-07-02 LAB — DRUG PROFILE 799031: BENZODIAZEPINES: NEGATIVE

## 2019-07-02 LAB — HEPARIN LEVEL (UNFRACTIONATED): Heparin Unfractionated: 0.3 IU/mL (ref 0.30–0.70)

## 2019-07-02 MED ORDER — MIDAZOLAM HCL 2 MG/2ML IJ SOLN
0.5000 mg | INTRAMUSCULAR | Status: DC | PRN
  Administered 2019-07-02 – 2019-07-03 (×2): 0.5 mg via INTRAVENOUS
  Filled 2019-07-02 (×2): qty 2

## 2019-07-02 MED ORDER — LABETALOL HCL 5 MG/ML IV SOLN: 5.0000 mg | INTRAVENOUS | Status: DC | PRN

## 2019-07-02 MED ORDER — CHLORHEXIDINE GLUCONATE 0.12 % MT SOLN
OROMUCOSAL | Status: AC
  Administered 2019-07-02: 20:00:00 15 mL via OROMUCOSAL
  Filled 2019-07-02: qty 15

## 2019-07-02 MED ORDER — FREE WATER
200.0000 mL | Status: DC
  Administered 2019-07-02 – 2019-07-05 (×19): 200 mL

## 2019-07-02 MED ORDER — LABETALOL HCL 5 MG/ML IV SOLN
10.0000 mg | INTRAVENOUS | Status: DC | PRN
  Administered 2019-07-02: 20 mg via INTRAVENOUS
  Administered 2019-07-03 (×2): 10 mg via INTRAVENOUS
  Filled 2019-07-02 (×3): qty 4

## 2019-07-02 NOTE — Progress Notes (Addendum)
ANTICOAGULATION CONSULT NOTE   Pharmacy Consult for Heparin  Indication: chest pain/ACS  No Known Allergies  Patient Measurements: Height: '6\' 2"'  (188 cm) Weight: 229 lb 15 oz (104.3 kg) IBW/kg (Calculated) : 82.2 Heparin Dosing Weight: 90  Vital Signs: Temp: 98.1 F (36.7 C) (12/09 0700) Temp Source: Bladder (12/09 0700) BP: 124/64 (12/09 0700) Pulse Rate: 63 (12/09 0700)  Labs: Recent Labs    06/29/19 1015  06/29/19 1143  06/30/19 0412  06/30/19 1210 07/01/19 0353 07/01/19 1146 07/01/19 1556 07/02/19 0403  HGB  --   --   --    < > 14.7   < > 13.3 13.2  --  12.8* 12.2*  HCT  --   --   --    < > 43.4   < > 39.0 39.7  --  37.9* 35.6*  PLT  --   --   --   --  201  --   --  163  --   --  124*  APTT  --   --  65*  --   --   --   --   --   --   --   --   LABPROT  --   --  14.3  --   --   --   --   --   --   --   --   INR  --   --  1.1  --   --   --   --   --   --   --   --   HEPARINUNFRC  --    < > 0.28*  --  0.52  --   --  0.19* 0.26*  --  0.30  CREATININE  --   --   --    < > 1.09   < > 1.00 0.97  --   --  0.81  TROPONINIHS 1,497*  --  6,742*  --   --   --   --   --   --   --   --    < > = values in this interval not displayed.    Estimated Creatinine Clearance: 129.5 mL/min (by C-G formula based on SCr of 0.81 mg/dL).   Medical History: History reviewed. No pertinent past medical history.  Medications:  Medications Prior to Admission  Medication Sig Dispense Refill Last Dose  . atorvastatin (LIPITOR) 80 MG tablet Take 80 mg by mouth daily.     . cetirizine (ZYRTEC) 10 MG tablet Take 10 mg by mouth daily.     . famotidine (PEPCID) 20 MG tablet Take 20 mg by mouth 2 (two) times daily as needed for indigestion.     Marland Kitchen FLUoxetine (PROZAC) 20 MG capsule Take 20 mg by mouth daily.     Marland Kitchen gabapentin (NEURONTIN) 300 MG capsule Take 600-900 mg by mouth 3 (three) times daily.     . hydrOXYzine (ATARAX/VISTARIL) 25 MG tablet Take 25 mg by mouth at bedtime.     Marland Kitchen  lisinopril-hydrochlorothiazide (ZESTORETIC) 20-25 MG tablet Take 1 tablet by mouth daily.     Marland Kitchen LORazepam (ATIVAN) 1 MG tablet Take 1 mg by mouth every 6 (six) hours as needed for anxiety.     . metFORMIN (GLUCOPHAGE) 1000 MG tablet Take 1,000 mg by mouth 2 (two) times daily.     . methocarbamol (ROBAXIN) 500 MG tablet Take 500 mg by mouth 4 (four) times daily.     . metoprolol tartrate (  LOPRESSOR) 25 MG tablet Take 25 mg by mouth 2 (two) times daily.     . Oxycodone HCl 10 MG TABS Take 10 mg by mouth every 6 (six) hours as needed.     . pantoprazole (PROTONIX) 40 MG tablet Take 40 mg by mouth daily.      Scheduled:  . artificial tears  1 application Both Eyes X5Q  . chlorhexidine gluconate (MEDLINE KIT)  15 mL Mouth Rinse BID  . Chlorhexidine Gluconate Cloth  6 each Topical Daily  . feeding supplement (PRO-STAT SUGAR FREE 64)  60 mL Per Tube TID  . feeding supplement (VITAL HIGH PROTEIN)  1,000 mL Per Tube Q24H  . fentaNYL (SUBLIMAZE) injection  100 mcg Intravenous Once  . insulin aspart  2-6 Units Subcutaneous Q4H  . mouth rinse  15 mL Mouth Rinse 10 times per day  . pantoprazole sodium  40 mg Per Tube QHS  . sodium chloride flush  10-40 mL Intracatheter Q12H    Assessment: 58 yo male s/p cardiac arrest ( History of CAD with multiple stents) and with VDRF and status epilepticus. History of CAD with multiple stents. He is noted with poor prognosis. Pharmacy consulted to dose heparin. -heparin level= 0.3, plt = 124 (trend down)  Goal of Therapy:  Heparin level 0.3-0.7 units/ml Monitor platelets by anticoagulation protocol: Yes   Plan:   Increase heparin to 1700 units/hr Recheck heparin level in am  Hildred Laser, PharmD Clinical Pharmacist **Pharmacist phone directory can now be found on [email or web address] ([email or web address]).  Listed under [facility].

## 2019-07-02 NOTE — Progress Notes (Signed)
NAME:  Clayton Foster, MRN:  [id number], DOB:  March 13, 1961, LOS: 4 ADMISSION DATE:  07/06/2019,  CHIEF COMPLAINT:  Cardiac arrest  Brief History   The patient is a 58 year old gentleman with a history of coronary artery disease with multiple stent placements, chronic back pain, tobacco use disorder, anxiety and depression as well as chronic benzodiazepine and opioid dependency.  He presented with a witnessed out of hospital cardiac arrest which was on 07/22/2019.  He received CPR by the fire department within a few minutes, and multiple shocks were delivered in the field.  He underwent targeted temperature management on the evening of 12/5, and was admitted to heart for further management.  PCCM to admit.  Consults:  Neurology Cardiology  Procedures:  12/5 R IJ central line 12/5 arterial line 12/5 intubation  Significant Diagnostic Tests:  12/6 EEG demonstrating status epilepticus>>12/7  12/6 Echo EF 25-30%. No LVH. G1DD. Global RV severely reduced systolic function. LA normal. RA normal. No pericardial effusion. MV normal, no regurg. TV normal, and no regurg. AV, mild thickening, mild calcification. PV normal, no regurg. Severe biventricular systolic dysfunction. IAS/shunts, no evidence of PFO.  No VSD.  12/8 MRI Brain: There is cortical restricted diffusion involving the frontal, parietal, and occipital lobes bilaterally. Possible mildly increased T2 hyperintensity of the caudate nuclei and thalami without reduced diffusion. There is no evidence of intracranial hemorrhage. 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 Abnormal signal described above compatible with hypoxic-ischemic injury. Micro Data:  12/5 respiratory virus panel is negative 12/5 Sputum culture>abundant staph aureus   12/5 Urine culture> NGTD  12/5 Covid test negative 12/5 MRSA PCR negative 12/5 blood cultures negative to date.  Antimicrobials:  Ceftriaxone 12/5>>12/6 Cefazolin 12/8>>   Interim history/subjective:  MRI c/w anoxic brain injury. Poor neuro exam. No further seizures.  On minimal pressors.   Objective   Blood pressure 124/64, pulse 63, temperature 98.1 F (36.7 C), temperature source Bladder, resp. rate 11, height 6\' 2"  (1.88 m), weight 104.3 kg, SpO2 100 %. CVP:  [8 mmHg-11 mmHg] 9 mmHg  Vent Mode: PRVC FiO2 (%):  [40 %] 40 % Set Rate:  [12 bmp] 12 bmp Vt Set:  [570 mL] 570 mL PEEP:  [5 cmH20] 5 cmH20 Plateau Pressure:  [16 cmH20-22 cmH20] 16 cmH20   Intake/Output Summary (Last 24 hours) at 07/02/2019 0823 Last data filed at 07/02/2019 0700 Gross per 24 hour  Intake 1926.09 ml  Output 2150 ml  Net -223.91 ml   Filed Weights   06/30/19 0500 07/01/19 0409 07/02/19 0401  Weight: 104.4 kg 107.5 kg 104.3 kg    Examination: General: Well-developed, well nourished adult male.  Critically ill-appearing. HENT: Normocephalic, pupils nonreactive. 56mm bilaterally. Ocular bobbing noted with eyelid fluttering. Moist mucus membranes Neck: No JVD. Trachea midline.  CV: RRR. S1S2. No MRG. +2 distal pulses Lungs: BBS scattered rhonchi, FNL, symmetrical.  Vent supported. 570/12/5/.40. Over breathing vent ABD: +BS x4. SNT/ND. No masses, guarding or rigidity GU: Foley EXT: Flaccid. Trace edema to hands and feet.  Skin: PWD.  Anterior surfaces in tact. No rashes or lesions Neuro: Unresponsive. No cough, no gag, no corneal reflexes. Over breathing vent.    Assessment & Plan:  Patient is 58 year old gentleman with a history of coronary artery disease who presents with a witnessed out-of-hospital cardiac arrest.  VT/VF witnessed OOH arrest Cardiogenic Shock-weaning pressors Cardiomyopathy Continue with norepinephrine for pressor support map goal 65. Completed TTM. On heparin gtt per cardiology Goal mag 2.5, goal  potassium 4.5 Would need cardiac catheterization pending neurological recovery which is unlikely.  Status epilepticus Acute encephalopathy with  anoxic injury per MRI. Off sedation, continued poor neuro exam.  Appreciate neurology following  Continue Keppra and Depakote Continue seizure precautions Poor prognosis for meaningful neurological recovery. Will d/w family.    Acute Hypoxemic Respiratory Failure MSSA in sputum  Continue ventilator support to prevent eminent deterioration and further organ dysfunction from hypoxemia and hypercarbia.   Maintain SpO2 greater than or equal to 90%. Head of bed elevated 30 degrees. Plateau pressures less than 30 cm H20.  Mental status precludes SBT. Can trial for mechanics.  Follow chest x-ray, ABG as needed.   Bronchial hygiene. RT/bronchodilator protocol. Cefazolin per pharmacy   Hypernatremia. Free H2O deficit 4.5L Add Free H2O Follow Na  Thrombocytopenia. Mild Follow   Best practice:  Diet: NPO.  Tube feeds Pain/Anxiety/Delirium protocol (if indicated): deeply sedated on fentanyl and versed VAP protocol (if indicated): ordered DVT prophylaxis: heparin gtt GI prophylaxis: PPI Glucose control: Q4H with SSI  Foley Yes Mobility: Bed rest Code Status: Full Family Communication: Will update Disposition: needs ICU   Labs reviewed in EMR       Critical care time:      The patient is critically ill with respiratory failure following cardiac arrest. He/She requires ICU for high complexity decision making, titration of high alert medications, ventilator management, titration of oxygen and interpretation of advanced monitoring.    I personally spent 35 minutes providing critical care services including personally reviewing test results, discussing care with nursing staff/other physicians and completing orders pertaining to this patient.  Time was exclusive to the patient and does not include time spent teaching or in procedures.  Voice recognition software was used in the production of this record.  Errors in interpretation may have been inadvertently missed during review.   Francine Graven, MSN, AGACNP  [facility]

## 2019-07-02 NOTE — Progress Notes (Addendum)
Reason for consult: Status epilepticus  Subjective: NAEO. Off sedation for over 24 hours  ROS: Unable to obtain due to poor mental status  Examination  Vital signs in last 24 hours: Temp:  [98.1 F (36.7 C)-99.1 F (37.3 C)] 98.4 F (36.9 C) (12/09 0900) Pulse Rate:  [62-84] 72 (12/09 0900) Resp:  [10-24] 12 (12/09 0900) BP: (114-162)/(60-89) 135/67 (12/09 0900) SpO2:  [95 %-100 %] 100 % (12/09 0900) Arterial Line BP: (124-175)/(34-62) 160/59 (12/09 0900) FiO2 (%):  [40 %] 40 % (12/09 0750) Weight:  [104.3 kg] 104.3 kg (12/09 0401)  General: lying in bed,not in apparent distress CVS: pulse-normal rate and rhythm RS: breathing comfortably,intubated Extremities: normal   Neuro:Off sedation  JG:GEZMOQHU, does not open eyes to noxious stimuli  TM:LYYTKP not reacting to light, corneal reflex absent, gag reflex present today, rest of the cranial nerves difficult to assess secondary to intubation Motor:Does not withdraw to noxious stimuli in all extremities  Basic Metabolic Panel: Recent Labs  Lab 06/29/19 0354  06/29/19 1411  06/29/19 1951  06/30/19 0412  06/30/19 0747 06/30/19 1009 06/30/19 1210 06/30/19 1700 07/01/19 0353 07/01/19 1556 07/02/19 0403  NA 138   < > 141   < > 138   < > 141   < > 144 144 144  --  144  --  150*  K 3.5   < > 4.0   < > 5.3*   < > 5.3*   < > 5.3* 5.2* 5.1  --  4.5  --  4.2  CL 110   < > 113*  --  111   < > 112*   < > 113* 114* 115*  --  117*  --  116*  CO2 18*   < > 22  --  19*  --  19*  --   --   --   --   --  21*  --  25  GLUCOSE 156*   < > 167*  --  147*   < > 152*   < > 157* 142* 155*  --  166*  --  193*  BUN 18   < > 13  --  16   < > 18   < > 17 17 15   --  13  --  13  CREATININE 0.83   < > 0.70  --  1.07   < > 1.09   < > 0.90 1.00 1.00  --  0.97  --  0.81  CALCIUM 8.4*   < > 8.5*  --  8.2*  --  8.0*  --   --   --   --   --  8.3*  --  9.0  MG 1.7  --   --   --   --   --  1.8  --   --   --   --  1.8 1.9 2.0  --   PHOS 2.3*  --    --   --   --   --  5.0*  --   --   --   --  3.3 3.1 2.4*  --    < > = values in this interval not displayed.    CBC: Recent Labs  Lab 06/30/2019 1555  06/25/2019 2015  06/29/19 0354  06/30/19 0412  06/30/19 1009 06/30/19 1210 07/01/19 0353 07/01/19 1556 07/02/19 0403  WBC 11.7*  --  12.3*  --  12.2*  --  17.6*  --   --   --  10.6*  --  6.6  NEUTROABS 5.3  --   --   --   --   --   --   --   --   --   --   --   --   HGB 15.0   < > 14.7   < > 15.2   < > 14.7   < > 14.6 13.3 13.2 12.8* 12.2*  HCT 43.8   < > 39.8   < > 41.8   < > 43.4   < > 43.0 39.0 39.7 37.9* 35.6*  MCV 95.4  --  90.7  --  91.3  --  96.4  --   --   --  99.3  --  99.2  PLT 211  --  178  --  206  --  201  --   --   --  163  --  124*   < > = values in this interval not displayed.     Coagulation Studies: No results for input(s): LABPROT, INR in the last 72 hours.  Imaging CT head 07-12-2019:No acute intracranial finding.  MRI Brain 07/01/2019: Abnormal signal described above compatible with hypoxic-ischemic injury.   ASSESSMENT AND PLAN:58yo M s/p cardiac arrest, was on TTM, now rewarming. Initial EEG showed nonconvulsive status epilepticus. Patient was loaded with Keppra after which Depakote was added. Even after adding, propofol and Versed, EEG continuing to show burst suppression with bursts in the form of generalized periodic epileptiform discharges. Now patient has been off sedation since 07/01/2019.   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  -Continue Keppra and Depakote. - Discussed prognosis with  girlfriend/fiance at bedside who also reports discussing it with patient's mother. They requested second opinion and transfer to [facility]. I discussed this request with critical care team. They will try but it may not be possible to transfer as hospitals have been full and unable to accept transfer due to being full. -Continue seizure precautions - Neurology will follow peripherally. Please contact us if needed  CRITICAL CARE Performed by: Lora Havens  Total critical care time:94minutes  Critical care time was exclusive of separately billable procedures and treating other patients.  Critical care was necessary to treat or prevent imminent or life-threatening deterioration.  Critical care was time spent personally by me on the following activities: development of treatment plan with patient and/or surrogate as well as nursing, discussions with consultants, evaluation of patient's response to treatment, examination of patient, obtaining history from patient or surrogate, ordering and performing treatments and interventions, ordering and review of laboratory studies, ordering and review of radiographic studies, pulse oximetry and re-evaluation of patient's condition.

## 2019-07-02 NOTE — Progress Notes (Signed)
eLink Physician-Brief Progress Note Patient Name: Clayton Foster DOB: 1961/02/11 MRN: [id number]   Date of Service  07/02/2019  HPI/Events of Note  Hypertension, intermittent twitching movements.  eICU Interventions  Labetalol 10-20 mg iv Q 2 hours prn SBP > 150 mmHg, Versed 0.5 mg iv Q 1 hour prn twitching monements        Okoronkwo U Ogan 07/02/2019, 8:10 PM

## 2019-07-03 ENCOUNTER — Inpatient Hospital Stay (HOSPITAL_COMMUNITY): Payer: Medicaid Other

## 2019-07-03 DIAGNOSIS — Z66 Do not resuscitate: Secondary | ICD-10-CM

## 2019-07-03 DIAGNOSIS — Z515 Encounter for palliative care: Secondary | ICD-10-CM

## 2019-07-03 LAB — GLUCOSE, CAPILLARY
Glucose-Capillary: 115 mg/dL — ABNORMAL HIGH (ref 70–99)
Glucose-Capillary: 124 mg/dL — ABNORMAL HIGH (ref 70–99)
Glucose-Capillary: 151 mg/dL — ABNORMAL HIGH (ref 70–99)
Glucose-Capillary: 155 mg/dL — ABNORMAL HIGH (ref 70–99)
Glucose-Capillary: 157 mg/dL — ABNORMAL HIGH (ref 70–99)
Glucose-Capillary: 167 mg/dL — ABNORMAL HIGH (ref 70–99)

## 2019-07-03 LAB — CULTURE, BLOOD (ROUTINE X 2)
Culture: NO GROWTH
Culture: NO GROWTH
Special Requests: ADEQUATE
Special Requests: ADEQUATE

## 2019-07-03 LAB — BASIC METABOLIC PANEL
Anion gap: 9 (ref 5–15)
BUN: 18 mg/dL (ref 6–20)
CO2: 27 mmol/L (ref 22–32)
Calcium: 8.6 mg/dL — ABNORMAL LOW (ref 8.9–10.3)
Chloride: 112 mmol/L — ABNORMAL HIGH (ref 98–111)
Creatinine, Ser: 0.79 mg/dL (ref 0.61–1.24)
GFR calc Af Amer: >60 mL/min (ref >60)
GFR calc non Af Amer: >60 mL/min (ref >60)
Glucose, Bld: 184 mg/dL — ABNORMAL HIGH (ref 70–99)
Potassium: 4 mmol/L (ref 3.5–5.1)
Sodium: 148 mmol/L — ABNORMAL HIGH (ref 135–145)

## 2019-07-03 LAB — CBC
HCT: 34.9 % — ABNORMAL LOW (ref 39.0–52.0)
Hemoglobin: 11.7 g/dL — ABNORMAL LOW (ref 13.0–17.0)
MCH: 33.2 pg (ref 26.0–34.0)
MCHC: 33.5 g/dL (ref 30.0–36.0)
MCV: 99.1 fL (ref 80.0–100.0)
Platelets: 118 10*3/uL — ABNORMAL LOW (ref 150–400)
RBC: 3.52 MIL/uL — ABNORMAL LOW (ref 4.22–5.81)
RDW: 14.2 % (ref 11.5–15.5)
WBC: 5.8 10*3/uL (ref 4.0–10.5)
nRBC: 0 % (ref 0.0–0.2)

## 2019-07-03 LAB — MAGNESIUM: Magnesium: 1.2 mg/dL — ABNORMAL LOW (ref 1.7–2.4)

## 2019-07-03 LAB — HEPARIN LEVEL (UNFRACTIONATED): Heparin Unfractionated: 0.34 IU/mL (ref 0.30–0.70)

## 2019-07-03 MED ORDER — MIDAZOLAM HCL 2 MG/2ML IJ SOLN
4.0000 mg | Freq: Once | INTRAMUSCULAR | Status: AC
  Administered 2019-07-03: 19:00:00 4 mg via INTRAVENOUS
  Filled 2019-07-03: qty 4

## 2019-07-03 MED ORDER — CHLORHEXIDINE GLUCONATE 0.12 % MT SOLN
OROMUCOSAL | Status: AC
  Filled 2019-07-03: qty 15

## 2019-07-03 MED ORDER — SODIUM CHLORIDE 0.9 % IV SOLN
6.0000 g | Freq: Once | INTRAVENOUS | Status: AC
  Administered 2019-07-03: 6 g via INTRAVENOUS
  Filled 2019-07-03: qty 12

## 2019-07-03 MED ORDER — CISATRACURIUM BOLUS VIA INFUSION: 10.0000 mg | Freq: Once | INTRAVENOUS | Status: DC

## 2019-07-03 NOTE — Progress Notes (Signed)
NAME:  Clayton Foster, MRN:  [id number], DOB:  01-02-1961, LOS: 5 ADMISSION DATE:  10-Jul-2019,  CHIEF COMPLAINT:  Cardiac arrest  Brief History   The patient is a 58 year old gentleman with a history of coronary artery disease with multiple stent placements, chronic back pain, tobacco use disorder, anxiety and depression as well as chronic benzodiazepine and opioid dependency.  He presented with a witnessed out of hospital cardiac arrest which was on 07/10/2019.  He received CPR by the fire department within a few minutes, and multiple shocks were delivered in the field.  He underwent targeted temperature management on the evening of 12/5, and was admitted to heart for further management.  PCCM to admit.  Consults:  Neurology Cardiology  Procedures:  12/5 R IJ central line 12/5 arterial line 12/5 intubation  Significant Diagnostic Tests:  12/6 EEG demonstrating status epilepticus>>12/7  12/6 Echo EF 25-30%. No LVH. G1DD. Global RV severely reduced systolic function. LA normal. RA normal. No pericardial effusion. MV normal, no regurg. TV normal, and no regurg. AV, mild thickening, mild calcification. PV normal, no regurg. Severe biventricular systolic dysfunction. IAS/shunts, no evidence of PFO.  No VSD.  12/8 MRI Brain: There is cortical restricted diffusion involving the frontal, parietal, and occipital lobes bilaterally. Possible mildly increased T2 hyperintensity of the caudate nuclei and thalami without reduced diffusion. There is no evidence of intracranial hemorrhage. 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 Abnormal signal described above compatible with hypoxic-ischemic injury. Micro Data:  12/5 respiratory virus panel is negative 12/5 Sputum culture>abundant staph aureus   12/5 Urine culture> NGTD  12/5 Covid test negative 12/5 MRSA PCR negative 12/5 blood cultures negative to date.  Antimicrobials:  Ceftriaxone 12/5>>12/6 Cefazolin 12/8>>   Interim history/subjective:  Labetalol ordered overnight for HTN Versed ordered for twitching   Objective   Blood pressure (!) 141/79, pulse (!) 103, temperature 99.7 F (37.6 C), resp. rate 16, height 6\' 2"  (1.88 m), weight 106.8 kg, SpO2 93 %. CVP:  [4 mmHg-11 mmHg] 11 mmHg  Vent Mode: PRVC FiO2 (%):  [40 %-60 %] 50 % Set Rate:  [12 bmp] 12 bmp Vt Set:  [570 mL] 570 mL PEEP:  [5 cmH20] 5 cmH20 Plateau Pressure:  [16 cmH20-23 cmH20] 16 cmH20   Intake/Output Summary (Last 24 hours) at 07/03/2019 1043 Last data filed at 07/03/2019 1000 Gross per 24 hour  Intake 2776.13 ml  Output 2075 ml  Net 701.13 ml   Filed Weights   07/01/19 0409 07/02/19 0401 07/03/19 0427  Weight: 107.5 kg 104.3 kg 106.8 kg    Examination: General: WDWN critically ill appearing adult M. Intubated, sedated NAD  HENT: NCAT. ETT OGT secure. Pink mmm. Anicteric sclera CV: RRR s1s2 no rgm Cap refill < 3 seconds Lungs: Bilateral breath sounds. Scattered rhonchi. No accessory muscle use on PSV/CPAP ABD: Soft, round. Non-distended. + bowel sounds  GU: Foley EXT: Flaccid tone. Symmetrical muscle bulk. No obvious joint deformity.  Skin: clean, dry, warm, without rash  Neuro: Sedation held for exam, previously on 152mcg fentanyl.  Intermittent rhythmic gnawing movement of jaw, intermittent twitching. 41mm unresponsive pupils. No gag on exam.  Does not have response to painful stimuli. Is initiating breaths spontaneously   Assessment & Plan:  Patient is 58 year old gentleman with a history of coronary artery disease who presents with a witnessed out-of-hospital cardiac arrest. Patient with NCSE, anoxic brain injury with poor neurologic outcome. Family had requested transfer to outside facility; [place], [place], [place] unable to accept  due to covid   VT/VF witnessed OOH arrest Cardiogenic Shock- improved Cardiomyopathy Off pressors Completed TTM. Would need cardiac cath eventually if neurologic recovery were possibly  however this is exceedingly unlikely  P Continue heparin gtt Goal mag 2.5, goal potassium 4.5  Non-convulsive satus epilepticus Anoxic brain injury  -Poor neurologic recovery has been discussed with family by multiple providers. Family (mother, sister, fiance do not understand poor prognosis. There is also a father, half sister) P Appreciate neurology following- feel that neurologic recovery is not likely.  Continue Keppra and Depakote Continue seizure precautions Need to continue GOC conversations. -Fiance planning to arrive   12/10 afternoon. -Mother is decision maker and is not sure if she will come or not. In phone conversation mother, sister do not seem to grasp totality of this unfortunate situation. I have encouraged that they coordinate with Clydie Braun, the fiance, to determine a time for a joint meeting.  Will engage palliative care.   Acute Hypoxemic Respiratory Failure MSSA in sputum  Continue MV support. When PSV/CPAP patient does initiate breaths Cefazolin for MSSA per pharm Pulm hygiene RT/BD protocol   Hypernatremia, improving   Free H2O deficit 4.5L Hypomagnesemia P Continue FWF Replace mag   Thrombocytopenia. Mild Continue to follow   Fever -100.9 on 12/9, now 99.7 12/10 -without wbc count.  P Goal normothermia Ice packs, APAP prn  Hyperglycemia SSI   Goals of Care Long discussion on phone 12/10 with mother and sister: -Mother and sister state they are not aware of a family meeting for 12/10, and instead when the mother indicated she would have a meeting in a day (stated 12/9 for 12/10) she did not mean this literally, and has not planned to have a family meeting today. Fiance is planning for this meeting 12/10. I have encouraged the mother, sister, fiance to connect and discuss logistics as well as a time/date for a meeting.  - Neurologic prognosis has been explained to family several times by PCCM and neurology, and I have done the same in plain terms. There  continues to be a disconnect between severity of situation/poor prognosis vs expected outcomes (Family hopes to be able to take care of patient at home) -Will consult palliative care   Best practice:  Diet: EN Pain/Anxiety/Delirium protocol (if indicated): deeply sedated on fentanyl and versed VAP protocol (if indicated): ordered DVT prophylaxis: heparin gtt GI prophylaxis: PPI Glucose control: SSI Foley Yes Mobility: Bed rest Code Status: Full Family Communication: Updated mother, sister via phone 12/10. Fiance planning for bedside meeting this afternoon Disposition:  ICU  Critical care time:     CRITICAL CARE Performed by: Lanier Clam   Total critical care time: 45 minutes  Critical care time was exclusive of separately billable procedures and treating other patients.  Critical care was necessary to treat or prevent imminent or life-threatening deterioration.  Critical care was time spent personally by me on the following activities: development of treatment plan with patient and/or surrogate as well as nursing, discussions with consultants, evaluation of patient's response to treatment, examination of patient, obtaining history from patient or surrogate, ordering and performing treatments and interventions, ordering and review of laboratory studies, ordering and review of radiographic studies, pulse oximetry and re-evaluation of patient's condition.  Tessie Fass MSN, AGACNP-BC [facility] [phone] If no answer, [phone] 07/03/2019, 10:44 AM

## 2019-07-03 NOTE — Consult Note (Signed)
Palliative Care Consult Note  Met with family this evening to discuss goals of care. Patient visibly disynchronous with the vent, saturations falling despite recruitment attempts. Patient had out of hospital cardiac arrest. Prognosis is very poor.  1. Family are moving towards comfort care- they are at bedside saying their last words and trying to accept as much as possible his condition.  2. DNR, no escalation  3. Plan is to keep him comfortable on the vent until he dies or a decision is made to Remove Mechanical Ventilation.  Start Propofol Infusion-titrate for comfort   Fentanyl infusion for pain (prior opioid dependence) will need higher than average doses most likely.  4. Requested Chaplain visit.  5. Provided support and answered questions.  Elizabeth Golding, DO Palliative Medicine [phone]  Time: 40 min Greater than 50%  of this time was spent counseling and coordinating care related to the above assessment and plan.  

## 2019-07-03 NOTE — Progress Notes (Signed)
Visited patient and family per RN page for end of life spiritual care for family. Prayed with family per their request. Will continue to be available for spiritual care as they need.  Rev. [place].

## 2019-07-03 NOTE — Progress Notes (Signed)
ANTICOAGULATION CONSULT NOTE   Pharmacy Consult for Heparin  Indication: chest pain/ACS  No Known Allergies  Patient Measurements: Height: '6\' 2"'$  (188 cm) Weight: 235 lb 7.2 oz (106.8 kg) IBW/kg (Calculated) : 82.2 Heparin Dosing Weight: 90  Vital Signs: Temp: 100.4 F (38 C) (12/10 0700) Temp Source: Bladder (12/10 0500) BP: 152/55 (12/10 0806) Pulse Rate: 88 (12/10 0806)  Labs: Recent Labs    07/01/19 0353 07/01/19 1146 07/01/19 1556 07/02/19 0403 07/03/19 0448 07/03/19 0449  HGB 13.2  --  12.8* 12.2* 11.7*  --   HCT 39.7  --  37.9* 35.6* 34.9*  --   PLT 163  --   --  124* 118*  --   HEPARINUNFRC 0.19* 0.26*  --  0.30  --  0.34  CREATININE 0.97  --   --  0.81 0.79  --     Estimated Creatinine Clearance: 132.6 mL/min (by C-G formula based on SCr of 0.79 mg/dL).   Medical History: History reviewed. No pertinent past medical history.  Medications:  Medications Prior to Admission  Medication Sig Dispense Refill Last Dose  . atorvastatin (LIPITOR) 80 MG tablet Take 80 mg by mouth daily.     . cetirizine (ZYRTEC) 10 MG tablet Take 10 mg by mouth daily.     . famotidine (PEPCID) 20 MG tablet Take 20 mg by mouth 2 (two) times daily as needed for indigestion.     Marland Kitchen FLUoxetine (PROZAC) 20 MG capsule Take 20 mg by mouth daily.     Marland Kitchen gabapentin (NEURONTIN) 300 MG capsule Take 600-900 mg by mouth 3 (three) times daily.     . hydrOXYzine (ATARAX/VISTARIL) 25 MG tablet Take 25 mg by mouth at bedtime.     Marland Kitchen lisinopril-hydrochlorothiazide (ZESTORETIC) 20-25 MG tablet Take 1 tablet by mouth daily.     Marland Kitchen LORazepam (ATIVAN) 1 MG tablet Take 1 mg by mouth every 6 (six) hours as needed for anxiety.     . metFORMIN (GLUCOPHAGE) 1000 MG tablet Take 1,000 mg by mouth 2 (two) times daily.     . methocarbamol (ROBAXIN) 500 MG tablet Take 500 mg by mouth 4 (four) times daily.     . metoprolol tartrate (LOPRESSOR) 25 MG tablet Take 25 mg by mouth 2 (two) times daily.     . Oxycodone HCl  10 MG TABS Take 10 mg by mouth every 6 (six) hours as needed.     . pantoprazole (PROTONIX) 40 MG tablet Take 40 mg by mouth daily.      Scheduled:  . artificial tears  1 application Both Eyes F0Y  . chlorhexidine gluconate (MEDLINE KIT)  15 mL Mouth Rinse BID  . Chlorhexidine Gluconate Cloth  6 each Topical Daily  . feeding supplement (PRO-STAT SUGAR FREE 64)  60 mL Per Tube TID  . feeding supplement (VITAL HIGH PROTEIN)  1,000 mL Per Tube Q24H  . fentaNYL (SUBLIMAZE) injection  100 mcg Intravenous Once  . free water  200 mL Per Tube Q4H  . insulin aspart  2-6 Units Subcutaneous Q4H  . mouth rinse  15 mL Mouth Rinse 10 times per day  . pantoprazole sodium  40 mg Per Tube QHS  . sodium chloride flush  10-40 mL Intracatheter Q12H    Assessment: 57 yo male s/p cardiac arrest ( History of CAD with multiple stents) and with VDRF and status epilepticus. History of CAD with multiple stents. He is noted with poor prognosis. Pharmacy consulted to dose heparin. -heparin level= 0.34, plt =  118 (trend down)  Goal of Therapy:  Heparin level 0.3-0.7 units/ml Monitor platelets by anticoagulation protocol: Yes   Plan:   No heparin changes needed Daily heparin level and CBC  Hildred Laser, PharmD Clinical Pharmacist **Pharmacist phone directory can now be found on [email or web address] ([email or web address]).  Listed under [place].

## 2019-07-03 NOTE — Plan of Care (Addendum)
58 yo M s/p VT arrest, with unfortunate anoxic brain injury.  The patient's fiance, Clayton Foster, arrived at the bedside for family meeting. We reached the patient's mother, Clayton Foster, via speaker phone as Clayton Foster is the Optician, dispensing. We reviewed the patient's clinical course and poor prognosis. Clayton Foster expressed concerns of suffering, and stated "he would never want to be like this." The patient's mother articulated hopes that he would "get better and come home." Clayton Foster plainly re-iterated the previously discussed poor prognosis and again expressed concerns of suffering, and said that "even if we keep him on all this stuff he still could die but it would be worse he would suffer." Clayton Foster became very tearful and stated that she would never want him to suffer but was unsure of what to do. The patient acutely desaturated, FiO2 was increased and copious blood-tinged secretions suctioned. Clayton Foster restated her concerns.  We again reviewed clinical status and prognosis. We discussed code status and goals of care. A decision was reached to change the patient's code status to DNR immediately. Clayton Foster has requested a transition to comfort care, with compassionate extubation, for 12/11 in hopes that she may see the patient. We discussed the comfort care process. She endorses understanding that the patient's heart may stop in the interim period, in which case he will be allowed to pass away peacefully. Emotional support offered.   P -DNR -Comfort care tomorrow, 12/11, when mother arrives.     Time spent: 33 minutes   Eliseo Gum MSN, AGACNP-BC [place] [phone] If no answer, [phone] 07/03/2019, 2:06 PM

## 2019-07-03 NOTE — Progress Notes (Signed)
CDS aware of patient. At this time, patient not candidate. Stated to call if change in clinical status or at time of death.

## 2019-07-03 NOTE — Progress Notes (Signed)
Family (including fiance, mother, sister, and nephew) at bedside to discuss transitioning to comfort care with Eliseo Gum NP.

## 2019-07-03 NOTE — Progress Notes (Signed)
eLink Physician-Brief Progress Note Patient Name: Clayton Foster DOB: 1960/11/21 MRN: [id number]   Date of Service  07/03/2019  HPI/Events of Note  Saturation down to 83 %, Pt is visibly dyssynchronous on the ventilator.  eICU Interventions  Versed 4 mg iv x 1, Nimbex 10 mg iv x 1, PEEP increased to 8 cm, portable CXR and ABG ordered.        [name] [place] 07/03/2019, 7:33 PM

## 2019-07-04 LAB — BASIC METABOLIC PANEL
Anion gap: 9 (ref 5–15)
BUN: 24 mg/dL — ABNORMAL HIGH (ref 6–20)
CO2: 28 mmol/L (ref 22–32)
Calcium: 8.6 mg/dL — ABNORMAL LOW (ref 8.9–10.3)
Chloride: 110 mmol/L (ref 98–111)
Creatinine, Ser: 1.04 mg/dL (ref 0.61–1.24)
GFR calc Af Amer: >60 mL/min (ref >60)
GFR calc non Af Amer: >60 mL/min (ref >60)
Glucose, Bld: 151 mg/dL — ABNORMAL HIGH (ref 70–99)
Potassium: 4.1 mmol/L (ref 3.5–5.1)
Sodium: 147 mmol/L — ABNORMAL HIGH (ref 135–145)

## 2019-07-04 LAB — GLUCOSE, CAPILLARY
Glucose-Capillary: 117 mg/dL — ABNORMAL HIGH (ref 70–99)
Glucose-Capillary: 126 mg/dL — ABNORMAL HIGH (ref 70–99)
Glucose-Capillary: 127 mg/dL — ABNORMAL HIGH (ref 70–99)
Glucose-Capillary: 131 mg/dL — ABNORMAL HIGH (ref 70–99)
Glucose-Capillary: 135 mg/dL — ABNORMAL HIGH (ref 70–99)
Glucose-Capillary: 137 mg/dL — ABNORMAL HIGH (ref 70–99)
Glucose-Capillary: 141 mg/dL — ABNORMAL HIGH (ref 70–99)

## 2019-07-04 LAB — CBC
HCT: 36 % — ABNORMAL LOW (ref 39.0–52.0)
Hemoglobin: 11.6 g/dL — ABNORMAL LOW (ref 13.0–17.0)
MCH: 33.1 pg (ref 26.0–34.0)
MCHC: 32.2 g/dL (ref 30.0–36.0)
MCV: 102.9 fL — ABNORMAL HIGH (ref 80.0–100.0)
Platelets: 113 10*3/uL — ABNORMAL LOW (ref 150–400)
RBC: 3.5 MIL/uL — ABNORMAL LOW (ref 4.22–5.81)
RDW: 14.2 % (ref 11.5–15.5)
WBC: 7.1 10*3/uL (ref 4.0–10.5)
nRBC: 0 % (ref 0.0–0.2)

## 2019-07-04 LAB — TRIGLYCERIDES: Triglycerides: 91 mg/dL (ref <150)

## 2019-07-04 LAB — HEPARIN LEVEL (UNFRACTIONATED): Heparin Unfractionated: 0.4 IU/mL (ref 0.30–0.70)

## 2019-07-04 LAB — MAGNESIUM: Magnesium: 2.7 mg/dL — ABNORMAL HIGH (ref 1.7–2.4)

## 2019-07-04 NOTE — Plan of Care (Addendum)
Update: Fiance at bedside. She does not have contact information for the patient's children and reveals that he is estranged from his children. Santiago Glad continues to express concerns for suffering. Emotional support provided.  ________________________________  58 yo M with unfortunate anoxic injury s/p cardiac arrest.   Family (mother, sister, fiance, nephew) arrived at bedside yesterday for [place] talks but did not reach decision regarding withdrawal of care. Mother has requested additional time. It was been articulated that the patient's clinical status is worsening and he may die.  It also became apparent that the patient has an unknown number of children, 1-2 (of possible 3-5) are thought by sister to be adults. No family members have contact for these children. In [place], children would be NOK vs parent; so PCCM has reached out to social work for assistance in contact these family members.   Sister denies having these contacts despite referencing recent phone call with daughter, Juliann Pulse, saying "they don't talk much." Patient's mother, Earlie Server refused to speak on the phone this morning. Fiance Santiago Glad is planning on arriving at bedside   In Interim, patient is DNR, no escalation of care. We will continue to follow up.    Eliseo Gum MSN, AGACNP-BC [place] [phone] If no answer, [phone] 07/04/2019, 2:07 PM

## 2019-07-04 NOTE — Progress Notes (Signed)
NAME:  Clayton Foster, MRN:  [id number], DOB:  [birth year], LOS: 6 ADMISSION DATE:  07/17/2019,  CHIEF COMPLAINT:  Cardiac arrest  Brief History   The patient is a 58 year old gentleman with a history of coronary artery disease with multiple stent placements, chronic back pain, tobacco use disorder, anxiety and depression as well as chronic benzodiazepine and opioid dependency.  He presented with a witnessed out of hospital cardiac arrest which was on 07-17-2019.  He received CPR by the fire department within a few minutes, and multiple shocks were delivered in the field.  He underwent targeted temperature management on the evening of 12/5, and was admitted to heart for further management.  PCCM to admit.  Consults:  Neurology Cardiology  Procedures:  12/5 R IJ central line 12/5 arterial line 12/5 intubation  Significant Diagnostic Tests:  12/6 EEG demonstrating status epilepticus>>12/7  12/6 Echo EF 25-30%. No LVH. G1DD. Global RV severely reduced systolic function. LA normal. RA normal. No pericardial effusion. MV normal, no regurg. TV normal, and no regurg. AV, mild thickening, mild calcification. PV normal, no regurg. Severe biventricular systolic dysfunction. IAS/shunts, no evidence of PFO.  No VSD.  12/8 MRI Brain: There is cortical restricted diffusion involving the frontal, parietal, and occipital lobes bilaterally. Possible mildly increased T2 hyperintensity of the caudate nuclei and thalami without reduced diffusion. There is no evidence of intracranial hemorrhage. 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 Abnormal signal described above compatible with hypoxic-ischemic injury. Micro Data:  12/5 respiratory virus panel is negative 12/5 Sputum culture>abundant staph aureus   12/5 Urine culture> NGTD  12/5 Covid test negative 12/5 MRSA PCR negative 12/5 blood cultures negative to date.  Antimicrobials:  Ceftriaxone 12/5>>12/6 Cefazolin  12/8>>  Interim history/subjective:  Labetalol ordered overnight for HTN Versed ordered for twitching   Objective   Blood pressure (!) 96/45, pulse 92, temperature 98.6 F (37 C), resp. rate 17, height 6\' 2"  (1.88 m), weight 107.3 kg, SpO2 98 %. CVP:  [9 mmHg] 9 mmHg  Vent Mode: PRVC FiO2 (%):  [50 %-100 %] 100 % Set Rate:  [12 bmp] 12 bmp Vt Set:  [570 mL] 570 mL PEEP:  [5 cmH20-10 cmH20] 10 cmH20 Plateau Pressure:  [31 cmH20-34 cmH20] 34 cmH20   Intake/Output Summary (Last 24 hours) at 07/04/2019 0919 Last data filed at 07/04/2019 0900 Gross per 24 hour  Intake 3781.75 ml  Output 2210 ml  Net 1571.75 ml   Filed Weights   07/02/19 0401 07/03/19 0427 07/04/19 0500  Weight: 104.3 kg 106.8 kg 107.3 kg    Examination: General: WDWN critically ill appearing adult M. Intubated, sedated NAD  HENT: NCAT. ETT OGT secure. Pink mmm. Anicteric sclera CV: RRR s1s2 no rgm Cap refill < 3 seconds Lungs: Bilateral breath sounds. Scattered rhonchi. No accessory muscle use on PSV/CPAP ABD: Soft, round. Non-distended. + bowel sounds  GU: Foley EXT: Flaccid tone. Symmetrical muscle bulk. No obvious joint deformity.  Skin: clean, dry, warm, without rash  Neuro: Sedation held for exam, previously on 11mcg fentanyl.  Intermittent rhythmic gnawing movement of jaw, intermittent twitching. 6mm unresponsive pupils. No gag on exam.  Does not have response to painful stimuli. Is initiating breaths spontaneously   Assessment & Plan:  Patient is 59 year old gentleman with a history of coronary artery disease who presents with a witnessed out-of-hospital cardiac arrest. Patient with NCSE, anoxic brain injury with poor neurologic outcome. Family had requested transfer to outside facility; [place], [place], [facility] unable to accept due  to covid   VT/VF witnessed OOH arrest S/p TTM Cardiogenic Shock- improved Cardiomyopathy Would need cardiac cath eventually if neurologic recovery were possibly however this is  exceedingly unlikely  P Continue heparin gtt Goal mag 2.5, goal potassium 4.5  Non-convulsive satus epilepticus Anoxic brain injury  -Poor neurologic recovery has been discussed with family by multiple providers. Family (mother, sister, fiance do not understand poor prognosis. There is also a father, half sister) P Appreciate neurology following- feel that neurologic recovery is not likely.  Continue Keppra and Depakote Continue seizure precautions Need to continue GOC conversations, appreciate palliative involvement    Acute Hypoxemic Respiratory Failure Requiring intubation  MSSA in sputum  -vent requirements have increased from FiO2 40 PEEP 5 to FiO2 100 PEEP 10 over last 24 hrs P Continue vent until decision is reached for possible comfort care.  Initiates breaths Cefazolin for MSSA per pharm Pulm hygiene RT/BD protocol   Hypernatremia, slowly improving    Free H2O deficit 4.5L Hypomagnesemia, improved  P Continue FWF  Thrombocytopenia, mild  Continue to follow  Likely in setting of critical illness   Fever -100.9 on 12/9, now 99.7 12/10 -without wbc count.  P Goal normothermia Ice packs, APAP prn  Hyperglycemia SSI   Goals of Care -Discussed with Fiance at bedside with Mother on phone- made DNR 12/10 with plan for comport care 12/11 -12/10 evening fiance, mother, sister, nephew arrived at bedside unplanned for further GOC. Long discussion with family at that time which did not yield clear plan. Palliative arrived 12/10 evening with similar conversation. Is DNR, no escalation of care at present. Will follow up with family today again.   Best practice:  Diet: EN Pain/Anxiety/Delirium protocol (if indicated): propofol, fent VAP protocol (if indicated): ordered DVT prophylaxis: heparin gtt GI prophylaxis: PPI Glucose control: SSI Foley Yes Mobility: Bed rest Code Status: Full Family Communication: Updated mother, sister via phone 12/10. Fiance planning for  bedside meeting this afternoon Disposition:  ICU  Critical care time:     CRITICAL CARE Performed by: Lanier Clam   Total critical care time: 31 minutes  Critical care time was exclusive of separately billable procedures and treating other patients. Critical care was necessary to treat or prevent imminent or life-threatening deterioration.  Critical care was time spent personally by me on the following activities: development of treatment plan with patient and/or surrogate as well as nursing, discussions with consultants, evaluation of patient's response to treatment, examination of patient, obtaining history from patient or surrogate, ordering and performing treatments and interventions, ordering and review of laboratory studies, ordering and review of radiographic studies, pulse oximetry and re-evaluation of patient's condition.  Tessie Fass MSN, AGACNP-BC [place] [facility] [phone] If no answer, [phone] 07/04/2019, 9:20 AM

## 2019-07-04 NOTE — Progress Notes (Signed)
ANTICOAGULATION CONSULT NOTE   Pharmacy Consult for Heparin  Indication: chest pain/ACS  No Known Allergies  Patient Measurements: Height: '6\' 2"'  (188 cm) Weight: 236 lb 8.9 oz (107.3 kg) IBW/kg (Calculated) : 82.2 Heparin Dosing Weight: 90  Vital Signs: Temp: 98.6 F (37 C) (12/11 0800) Temp Source: Core (12/11 0400) BP: 96/45 (12/11 0806) Pulse Rate: 92 (12/11 0806)  Labs: Recent Labs    07/02/19 0403 07/03/19 0448 07/03/19 0449 07/04/19 0349  HGB 12.2* 11.7*  --  11.6*  HCT 35.6* 34.9*  --  36.0*  PLT 124* 118*  --  113*  HEPARINUNFRC 0.30  --  0.34 0.40  CREATININE 0.81 0.79  --  1.04    Estimated Creatinine Clearance: 102.2 mL/min (by C-G formula based on SCr of 1.04 mg/dL).   Medical History: History reviewed. No pertinent past medical history.  Medications:  Medications Prior to Admission  Medication Sig Dispense Refill Last Dose  . atorvastatin (LIPITOR) 80 MG tablet Take 80 mg by mouth daily.     . cetirizine (ZYRTEC) 10 MG tablet Take 10 mg by mouth daily.     . famotidine (PEPCID) 20 MG tablet Take 20 mg by mouth 2 (two) times daily as needed for indigestion.     Marland Kitchen FLUoxetine (PROZAC) 20 MG capsule Take 20 mg by mouth daily.     Marland Kitchen gabapentin (NEURONTIN) 300 MG capsule Take 600-900 mg by mouth 3 (three) times daily.     . hydrOXYzine (ATARAX/VISTARIL) 25 MG tablet Take 25 mg by mouth at bedtime.     Marland Kitchen lisinopril-hydrochlorothiazide (ZESTORETIC) 20-25 MG tablet Take 1 tablet by mouth daily.     Marland Kitchen LORazepam (ATIVAN) 1 MG tablet Take 1 mg by mouth every 6 (six) hours as needed for anxiety.     . metFORMIN (GLUCOPHAGE) 1000 MG tablet Take 1,000 mg by mouth 2 (two) times daily.     . methocarbamol (ROBAXIN) 500 MG tablet Take 500 mg by mouth 4 (four) times daily.     . metoprolol tartrate (LOPRESSOR) 25 MG tablet Take 25 mg by mouth 2 (two) times daily.     . Oxycodone HCl 10 MG TABS Take 10 mg by mouth every 6 (six) hours as needed.     . pantoprazole  (PROTONIX) 40 MG tablet Take 40 mg by mouth daily.      Scheduled:  . chlorhexidine gluconate (MEDLINE KIT)  15 mL Mouth Rinse BID  . Chlorhexidine Gluconate Cloth  6 each Topical Daily  . feeding supplement (PRO-STAT SUGAR FREE 64)  60 mL Per Tube TID  . feeding supplement (VITAL HIGH PROTEIN)  1,000 mL Per Tube Q24H  . fentaNYL (SUBLIMAZE) injection  100 mcg Intravenous Once  . free water  200 mL Per Tube Q4H  . insulin aspart  2-6 Units Subcutaneous Q4H  . mouth rinse  15 mL Mouth Rinse 10 times per day  . pantoprazole sodium  40 mg Per Tube QHS  . sodium chloride flush  10-40 mL Intracatheter Q12H    Assessment: 58 yo male s/p cardiac arrest ( History of CAD with multiple stents) and with VDRF and status epilepticus. History of CAD with multiple stents. He is noted with poor prognosis and plans noted for possible comfort care. Pharmacy consulted to dose heparin. -heparin level= 0.4, plt = 113 (trend down)  Goal of Therapy:  Heparin level 0.3-0.7 units/ml Monitor platelets by anticoagulation protocol: Yes   Plan:   No heparin changes needed Daily heparin level and  CBC  Hildred Laser, PharmD Clinical Pharmacist **Pharmacist phone directory can now be found on [email or web address] ([email or web address]).  Listed under [place].

## 2019-07-05 LAB — CBC
HCT: 32.5 % — ABNORMAL LOW (ref 39.0–52.0)
Hemoglobin: 10.6 g/dL — ABNORMAL LOW (ref 13.0–17.0)
MCH: 33.4 pg (ref 26.0–34.0)
MCHC: 32.6 g/dL (ref 30.0–36.0)
MCV: 102.5 fL — ABNORMAL HIGH (ref 80.0–100.0)
Platelets: 105 10*3/uL — ABNORMAL LOW (ref 150–400)
RBC: 3.17 MIL/uL — ABNORMAL LOW (ref 4.22–5.81)
RDW: 13.9 % (ref 11.5–15.5)
WBC: 8.7 10*3/uL (ref 4.0–10.5)
nRBC: 0 % (ref 0.0–0.2)

## 2019-07-05 LAB — BASIC METABOLIC PANEL
Anion gap: 9 (ref 5–15)
BUN: 34 mg/dL — ABNORMAL HIGH (ref 6–20)
CO2: 30 mmol/L (ref 22–32)
Calcium: 8.6 mg/dL — ABNORMAL LOW (ref 8.9–10.3)
Chloride: 111 mmol/L (ref 98–111)
Creatinine, Ser: 0.96 mg/dL (ref 0.61–1.24)
GFR calc Af Amer: >60 mL/min (ref >60)
GFR calc non Af Amer: >60 mL/min (ref >60)
Glucose, Bld: 142 mg/dL — ABNORMAL HIGH (ref 70–99)
Potassium: 4 mmol/L (ref 3.5–5.1)
Sodium: 150 mmol/L — ABNORMAL HIGH (ref 135–145)

## 2019-07-05 LAB — GLUCOSE, CAPILLARY
Glucose-Capillary: 132 mg/dL — ABNORMAL HIGH (ref 70–99)
Glucose-Capillary: 118 mg/dL — ABNORMAL HIGH (ref 70–99)
Glucose-Capillary: 125 mg/dL — ABNORMAL HIGH (ref 70–99)

## 2019-07-05 LAB — HEPARIN LEVEL (UNFRACTIONATED)
Heparin Unfractionated: 0.13 IU/mL — ABNORMAL LOW (ref 0.30–0.70)
Heparin Unfractionated: 0.29 IU/mL — ABNORMAL LOW (ref 0.30–0.70)

## 2019-07-05 LAB — MAGNESIUM: Magnesium: 2.4 mg/dL (ref 1.7–2.4)

## 2019-07-05 MED ORDER — GLYCOPYRROLATE 0.2 MG/ML IJ SOLN
0.2000 mg | INTRAMUSCULAR | Status: DC | PRN
  Administered 2019-07-05: 0.2 mg via INTRAVENOUS
  Filled 2019-07-05 (×2): qty 1

## 2019-07-05 MED ORDER — MORPHINE BOLUS VIA INFUSION
5.0000 mg | INTRAVENOUS | Status: DC | PRN
  Filled 2019-07-05: qty 5

## 2019-07-05 MED ORDER — ACETAMINOPHEN 650 MG RE SUPP: 650.0000 mg | Freq: Four times a day (QID) | RECTAL | Status: DC | PRN

## 2019-07-05 MED ORDER — DEXTROSE 5 % IV SOLN: INTRAVENOUS | Status: DC

## 2019-07-05 MED ORDER — GLYCOPYRROLATE 0.2 MG/ML IJ SOLN: 0.2000 mg | INTRAMUSCULAR | Status: DC | PRN

## 2019-07-05 MED ORDER — ACETAMINOPHEN 325 MG PO TABS: 650.0000 mg | ORAL_TABLET | Freq: Four times a day (QID) | ORAL | Status: DC | PRN

## 2019-07-05 MED ORDER — GLYCOPYRROLATE 1 MG PO TABS
1.0000 mg | ORAL_TABLET | ORAL | Status: DC | PRN
  Filled 2019-07-05: qty 1

## 2019-07-05 MED ORDER — MORPHINE SULFATE (PF) 2 MG/ML IV SOLN: 2.0000 mg | INTRAVENOUS | Status: DC | PRN

## 2019-07-05 MED ORDER — POLYVINYL ALCOHOL 1.4 % OP SOLN
1.0000 [drp] | Freq: Four times a day (QID) | OPHTHALMIC | Status: DC | PRN
  Filled 2019-07-05 (×2): qty 15

## 2019-07-05 MED ORDER — MORPHINE 100MG IN NS 100ML (1MG/ML) PREMIX INFUSION
0.0000 mg/h | INTRAVENOUS | Status: DC
  Administered 2019-07-05: 5 mg/h via INTRAVENOUS
  Filled 2019-07-05: qty 100

## 2019-07-05 MED ORDER — HEPARIN BOLUS VIA INFUSION
2000.0000 [IU] | Freq: Once | INTRAVENOUS | Status: AC
  Administered 2019-07-05: 2000 [IU] via INTRAVENOUS
  Filled 2019-07-05: qty 2000

## 2019-07-05 MED ORDER — DIPHENHYDRAMINE HCL 50 MG/ML IJ SOLN: 25.0000 mg | INTRAMUSCULAR | Status: DC | PRN

## 2019-07-07 ENCOUNTER — Encounter (HOSPITAL_COMMUNITY): Payer: Self-pay | Admitting: Emergency Medicine

## 2019-07-08 ENCOUNTER — Telehealth: Payer: Self-pay

## 2019-07-08 NOTE — Telephone Encounter (Signed)
[facility] (faxed copy)   DC is for cremation and a patient of Doctor Mannam.   DC will be taken to Pulmonary Unit for signature.  On 07/08/2019 Received dc back from Doctor Mannam.  I called the funeral home to let them know I faxed a copy to the funeral home per their request.

## 2019-07-11 ENCOUNTER — Telehealth: Payer: Self-pay

## 2019-07-11 NOTE — Telephone Encounter (Signed)
Received dc from [facility] (original)  DC is for cremation and a patient of Doctor Mannam.   DC will be taken to Pulmonary Unit for signature.  On 07/23/2019 Received dc back from Doctor Mannam.  I called the funeral home to let them know I mailed the dc to vital records per the funeral home request.

## 2019-07-25 NOTE — Progress Notes (Signed)
ANTICOAGULATION CONSULT NOTE   Pharmacy Consult for Heparin  Indication: chest pain/ACS  No Known Allergies  Patient Measurements: Height: _0  (188 cm) Weight: 242 lb 1 oz (109.8 kg) IBW/kg (Calculated) : 82.2 Heparin Dosing Weight: 90  Vital Signs: Temp: 100 F (37.8 C) (12/12 0800) Temp Source: Bladder (12/12 0400) BP: 110/62 (12/12 0800) Pulse Rate: 98 (12/12 0800)  Labs: Recent Labs    07/03/19 0448 07/04/19 0349 21-Jul-2019 0306 Jul 21, 2019 1130  HGB 11.7* 11.6* 10.6*  --   HCT 34.9* 36.0* 32.5*  --   PLT 118* 113* 105*  --   HEPARINUNFRC  --  0.40 0.13* 0.29*  CREATININE 0.79 1.04 0.96  --     Estimated Creatinine Clearance: 111.9 mL/min (by C-G formula based on SCr of 0.96 mg/dL).   Medical History: History reviewed. No pertinent past medical history.  Medications:  Medications Prior to Admission  Medication Sig Dispense Refill Last Dose  . atorvastatin (LIPITOR) 80 MG tablet Take 80 mg by mouth daily.     . cetirizine (ZYRTEC) 10 MG tablet Take 10 mg by mouth daily.     . famotidine (PEPCID) 20 MG tablet Take 20 mg by mouth 2 (two) times daily as needed for indigestion.     Marland Kitchen FLUoxetine (PROZAC) 20 MG capsule Take 20 mg by mouth daily.     Marland Kitchen gabapentin (NEURONTIN) 300 MG capsule Take 600-900 mg by mouth 3 (three) times daily.     . hydrOXYzine (ATARAX/VISTARIL) 25 MG tablet Take 25 mg by mouth at bedtime.     Marland Kitchen lisinopril-hydrochlorothiazide (ZESTORETIC) 20-25 MG tablet Take 1 tablet by mouth daily.     Marland Kitchen LORazepam (ATIVAN) 1 MG tablet Take 1 mg by mouth every 6 (six) hours as needed for anxiety.     . metFORMIN (GLUCOPHAGE) 1000 MG tablet Take 1,000 mg by mouth 2 (two) times daily.     . methocarbamol (ROBAXIN) 500 MG tablet Take 500 mg by mouth 4 (four) times daily.     . metoprolol tartrate (LOPRESSOR) 25 MG tablet Take 25 mg by mouth 2 (two) times daily.     . Oxycodone HCl 10 MG TABS Take 10 mg by mouth every 6 (six) hours as needed.     . pantoprazole  (PROTONIX) 40 MG tablet Take 40 mg by mouth daily.      Scheduled:  . chlorhexidine gluconate (MEDLINE KIT)  15 mL Mouth Rinse BID  . Chlorhexidine Gluconate Cloth  6 each Topical Daily  . feeding supplement (PRO-STAT SUGAR FREE 64)  60 mL Per Tube TID  . feeding supplement (VITAL HIGH PROTEIN)  1,000 mL Per Tube Q24H  . fentaNYL (SUBLIMAZE) injection  100 mcg Intravenous Once  . free water  200 mL Per Tube Q4H  . insulin aspart  2-6 Units Subcutaneous Q4H  . mouth rinse  15 mL Mouth Rinse 10 times per day  . pantoprazole sodium  40 mg Per Tube QHS  . sodium chloride flush  10-40 mL Intracatheter Q12H    Assessment: 59 yo male s/p cardiac arrest (History of CAD with multiple stents) and with VDRF and status epilepticus. Pharmacy consulted to dose heparin for ACS/STEMI.  -heparin level close to goal= 0.29, hgb = 10.6, plt = 105 (trend down)  Goal of Therapy:  Heparin level 0.3-0.7 units/ml Monitor platelets by anticoagulation protocol: Yes   Plan:   Will increase heparin slightly to 2100 units/hr Daily heparin level and CBC  Vertis Kelch, PharmD, BCPS PGY2 Cardiology  Pharmacy Resident Phone [phone] 2019/07/07       12:15 PM  Please check [email or web address] for unit-specific pharmacist phone numbers

## 2019-07-25 NOTE — Progress Notes (Addendum)
NAME:  Clayton Foster, MRN:  [id number], DOB:  [birth year], LOS: 7 ADMISSION DATE:  07/09/19,  CHIEF COMPLAINT:  Cardiac arrest  Brief History   The patient is a 59 year old gentleman with a history of coronary artery disease with multiple stent placements, chronic back pain, tobacco use disorder, anxiety and depression as well as chronic benzodiazepine and opioid dependency.  He presented with a witnessed out of hospital cardiac arrest which was on Jul 09, 2019.  He received CPR by the fire department within a few minutes, and multiple shocks were delivered in the field.  He underwent targeted temperature management on the evening of 12/5, and was admitted to heart for further management.  PCCM to admit.  Consults:  Neurology Cardiology  Procedures:  12/5 R IJ central line 12/5 Arterial line 12/5 Intubation  Significant Diagnostic Tests:  12/6 EEG demonstrating status epilepticus>>12/7  12/6 Echo EF 25-30%. No LVH. G1DD. Global RV severely reduced systolic function. Severe biventricular systolic dysfunction. IAS/shunts, no evidence of PFO.  No VSD.  12/8 MRI Brain: There is cortical restricted diffusion involving the frontal, parietal, and occipital lobes bilaterally. Possible mildly increased T2 hyperintensity of the caudate nuclei and thalami without reduced diffusion. There is no evidence of intracranial hemorrhage. 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 Abnormal signal described above compatible with hypoxic-ischemic Injury.  Micro Data:  12/5 respiratory virus panel is negative 12/5 Sputum culture>abundant staph aureus   12/5 Urine culture> NGTD  12/5 Covid test negative 12/5 MRSA PCR negative 12/5 blood cultures negative to date.  Antimicrobials:  Ceftriaxone 12/5>>12/6 Cefazolin 12/8>>  Interim history/subjective:  Remains in vent. No change in mental status.  Objective   Blood pressure 110/62, pulse 98, temperature 100 F (37.8 C), resp. rate  10, height 6\' 2"  (1.88 m), weight 109.8 kg, SpO2 97 %. CVP:  [8 mmHg-12 mmHg] 8 mmHg  Vent Mode: PRVC FiO2 (%):  [70 %-100 %] 70 % Set Rate:  [12 bmp] 12 bmp Vt Set:  [570 mL] 570 mL PEEP:  [10 cmH20] 10 cmH20 Plateau Pressure:  [27 cmH20-31 cmH20] 27 cmH20   Intake/Output Summary (Last 24 hours) at 07/14/2019 1001 Last data filed at 07/04/2019 0800 Gross per 24 hour  Intake 3573.12 ml  Output 2835 ml  Net 738.12 ml   Filed Weights   07/03/19 0427 07/04/19 0500 07/22/2019 0247  Weight: 106.8 kg 107.3 kg 109.8 kg    Examination: Gen:      No acute distress HEENT:  EOMI, sclera anicteric Neck:     No masses; no thyromegaly, ETT Lungs:    Clear to auscultation bilaterally; normal respiratory effort CV:         Regular rate and rhythm; no murmurs Abd:      + bowel sounds; soft, non-tender; no palpable masses, no distension Ext:    No edema; adequate peripheral perfusion Skin:      Warm and dry; no rash Neuro: Comatose   Assessment & Plan:  Patient is 59 year old gentleman with a history of coronary artery disease who presents with a witnessed out-of-hospital cardiac arrest. Patient with NCSE, anoxic brain injury with poor neurologic outcome. Family had requested transfer to outside facility; [place], [facility] unable to accept due to covid  VT/VF witnessed OOH arrest S/p TTM Cardiogenic Shock- improved Cardiomyopathy Would need cardiac cath eventually if neurologic recovery were possibly however this is exceedingly unlikely  P Continue heparin gtt Goal mag 2.5, goal potassium 4.5  Non-convulsive satus epilepticus Anoxic brain injury  -  Poor neurologic recovery has been discussed with family by multiple providers. Family (mother, sister, fiance do not understand poor prognosis. There is also a father, half sister) P Appreciate neurology following- feel that neurologic recovery is not likely.  Continue Keppra and Depakote Continue seizure precautions Need to continue GOC  conversations, appreciate palliative involvement    Acute Hypoxemic Respiratory Failure Requiring intubation  MSSA in sputum  -vent requirements have increased from FiO2 40 PEEP 5 to FiO2 100 PEEP 10 over last 24 hrs P Continue vent until decision is reached for possible comfort care.  Initiates breaths Cefazolin for MSSA per pharm Pulm hygiene RT/BD protocol   Hypernatremia, slowly improving    Free H2O deficit 4.5L Hypomagnesemia, improved  P Continue FWF  Thrombocytopenia, mild  Continue to follow  Likely in setting of critical illness   Fever -100.9 on 12/9, now 99.7 12/10 -without wbc count.  P Goal normothermia Ice packs, APAP prn  Hyperglycemia SSI   Goals of Care -Discussed with Fiance at bedside with Mother on phone- made DNR 12/10 with plan for comport care 12/11 -12/10 evening fiance, mother, sister, nephew arrived at bedside unplanned for further GOC. Long discussion with family at that time which did not yield clear plan. Palliative arrived 12/10 evening with similar conversation. Is DNR, no escalation of care at present.   Awaiting arrival of family 12/12 for goals of care discussion.  Best practice:  Diet: EN Pain/Anxiety/Delirium protocol (if indicated): propofol, fent VAP protocol (if indicated): ordered DVT prophylaxis: heparin gtt GI prophylaxis: PPI Glucose control: SSI Foley Yes Mobility: Bed rest Code Status: Full Family Communication: Updated mother, sister via phone 12/10. Fiance planning for bedside meeting this afternoon Disposition:  ICU  Critical care time:    The patient is critically ill with multiple organ system failure and requires high complexity decision making for assessment and support, frequent evaluation and titration of therapies, advanced monitoring, review of radiographic studies and interpretation of complex data.   Critical Care Time devoted to patient care services, exclusive of separately billable procedures,  described in this note is 45 minutes.   Chilton Greathouse MD [facility] 07/04/2019, 10:23 AM

## 2019-07-25 NOTE — Progress Notes (Signed)
ANTICOAGULATION CONSULT NOTE - Follow Up Consult  Pharmacy Consult for heparin Indication: ACS  Labs: Recent Labs    07/03/19 0448 07/03/19 0449 07/04/19 0349 07/06/19 0306  HGB 11.7*  --  11.6*  --   HCT 34.9*  --  36.0*  --   PLT 118*  --  113*  --   HEPARINUNFRC  --  0.34 0.40 0.13*  CREATININE 0.79  --  1.04  --     Assessment: 58yo male subtherapeutic on heparin after two levels at goal; no gtt issues or signs of bleeding per RN.  Goal of Therapy:  Heparin level 0.3-0.7 units/ml   Plan:  Will rebolus with heparin 2000 units and increase heparin gtt by 3 units/kg/hr to 2000 units/hr and check level in 6 hours.    Wynona Neat, PharmD, BCPS  06-Jul-2019,4:35 AM

## 2019-07-25 NOTE — Progress Notes (Signed)
  Interdisciplinary Goals of Care Family Meeting   Date carried out:: 07/20/2019  Location of the meeting: Bedside  Member's involved: Physician, Bedside Registered Nurse and Family Member or next of kin  Durable Power of Attorney or acting medical decision maker: Daughter- Kathe Mariner    Discussion: We discussed goals of care for [employer] .  Discussed his clinical status and findings of severe anoxic encephalopathy.  Any chance of meaningful recovery is very poor.  Juliann Pulse agreed with [place] that [place] would not want to be supported on the ventilator like this and has requested transition to comfort care and terminal extubation.  Will let his fiance and the rest of the family members know Orders placed for extubation and morphine.  Will inform CDC as well  Code status: Full DNR  Disposition: In-patient comfort care  Time spent for the meeting: [place] MD [facility] 07/11/2019, 1:29 PM

## 2019-07-25 NOTE — Progress Notes (Addendum)
Wasted 80mg  morphine and 50mg  fentanyl from gtt with Fuller [place] RN

## 2019-07-25 NOTE — Discharge Summary (Signed)
Physician Death Summary  Patient ID: Clayton Foster MRN: [id number] DOB/AGE: [birth year] 59 y.o.  Admit date: 11-Jul-2019 Discharge date: 07-18-19  Admission Diagnoses: Cardiopulmonary arrest  Discharge Diagnoses:  V. fib, V. tach arrest Cardiogenic shock Cardiomyopathy, status epilepticus Acute respiratory failure Anoxic brain injury  Discharged Condition: Deceased  Hospital Course:  59 year old gentleman with a history of coronary artery disease with multiple stent placements, chronic back pain, tobacco use disorder, anxiety and depression as well as chronic benzodiazepine and opioid dependency.  He presented with a witnessed out of hospital cardiac arrest which was on 07/11/2019.  He received CPR by the fire department within a few minutes, and multiple shocks were delivered in the field.  He underwent targeted temperature management on the evening of 12/5, and was admitted for further management.   He underwent targeted temperature management.  Noted to have signs of severe anoxic brain injury with status epilepticus. Family had requested transfer to outside facility; [place], [place], [facility] unable to accept due to covid.  After multiple discussions with family including fianc, mother and daughter the decision was made to transition to comfort care.  He was terminally extubated on 07/18/23 and passed away shortly thereafter  Signed: Anistyn Graddy 07/08/2019, 1:37 PM

## 2019-07-25 NOTE — Progress Notes (Signed)
RT NOTE: RT extubated patient to comfort per family wishes and MD order, with RN at bedside. RN and family currently at bedside.

## 2019-07-25 DEATH — deceased

## 2019-07-30 ENCOUNTER — Telehealth: Payer: Self-pay | Admitting: Pulmonary Disease

## 2019-07-30 NOTE — Telephone Encounter (Signed)
Dr. Isaiah Serge please advise when you have spoken to [facility]- she wishes to speak to you directly.  Thanks!

## 2019-07-31 NOTE — Telephone Encounter (Signed)
Yes. I spoke with Clayton Foster. Nothing else needed

## 2020-09-18 IMAGING — CT CT ANGIOGRAPHY ABDOMEN AND PELVIS WITH CONTRAST AND WITHOUT CONT
2 of 8 series · 15 of 46 positions shown, 17 images · IV contrast (omnipaque)
Comparison: 06/06/2013 CT of abdomen and pelvis. 12/06/2018 CT
lumbar spine.

CLINICAL DATA: 57 y/o  M; chronic mesenteric ischemia.

EXAM:
CTA ABDOMEN AND PELVIS wITHOUT AND WITH CONTRAST
TECHNIQUE: Multidetector CT imaging of the abdomen and pelvis was performed
using the standard protocol during bolus administration of
intravenous contrast. Multiplanar reconstructed images and MIPs were
obtained and reviewed to evaluate the vascular anatomy.
CONTRAST:  100mL OMNIPAQUE IOHEXOL 350 MG/ML SOLN

[Series 7: arterial · axial · arterial · 0.78mm/px · z∈[+685,+1109]mm · 12 of 252 slices shown, 14 images]
[im 20/252  soft-tissue]
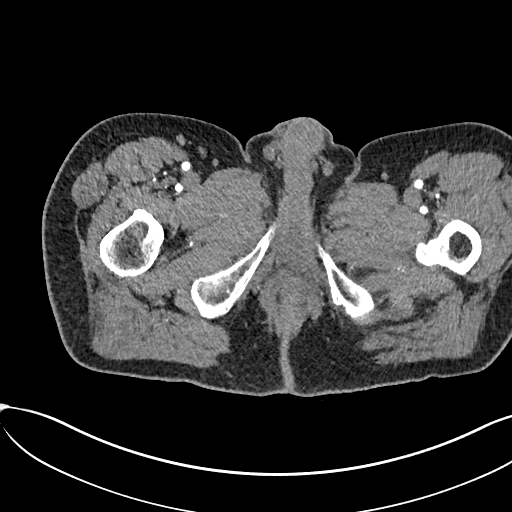
[im 20/252  bone]
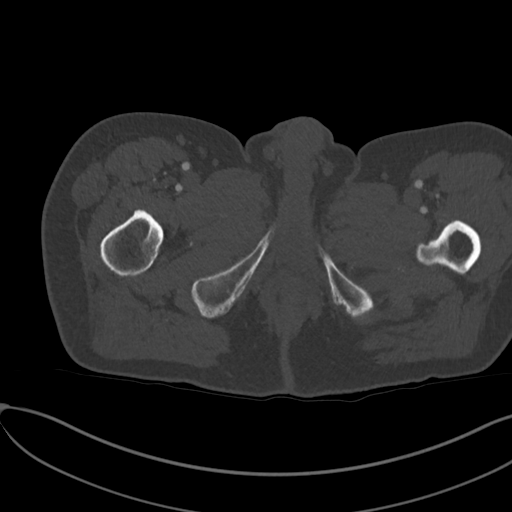
[im 39/252  soft-tissue]
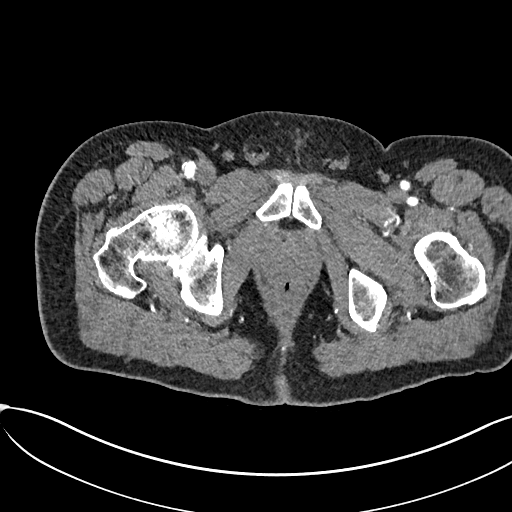
[im 58/252  soft-tissue]
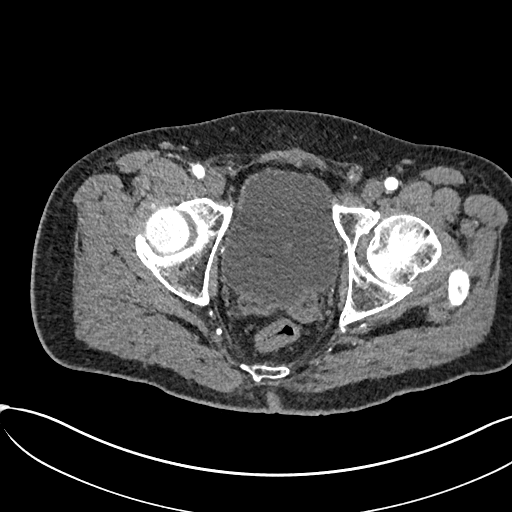
[im 78/252  soft-tissue]
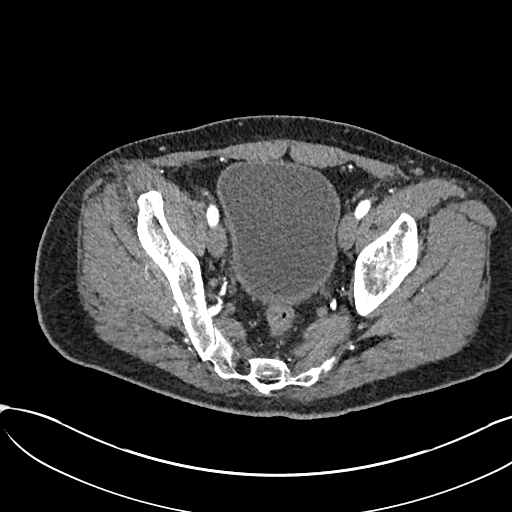
[im 97/252  soft-tissue]
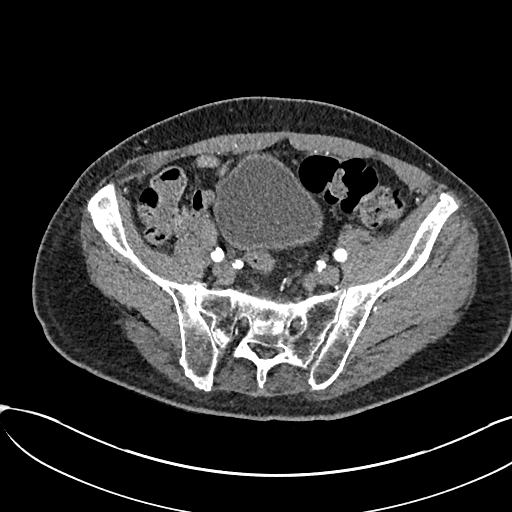
[im 116/252  soft-tissue]
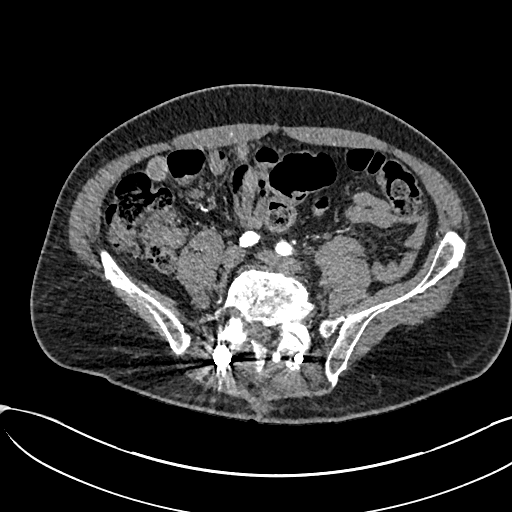
[im 136/252  soft-tissue]
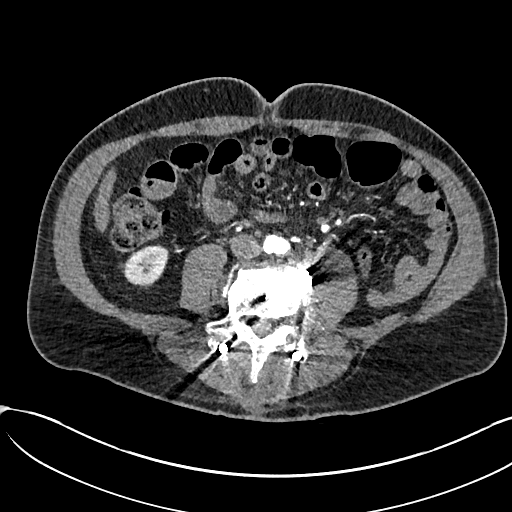
[im 155/252  soft-tissue]
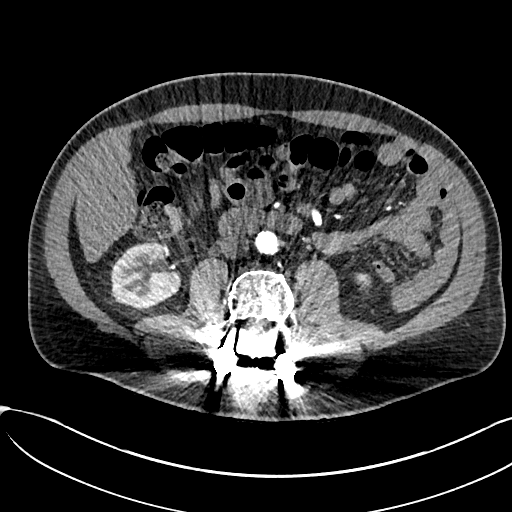
[im 174/252  soft-tissue]
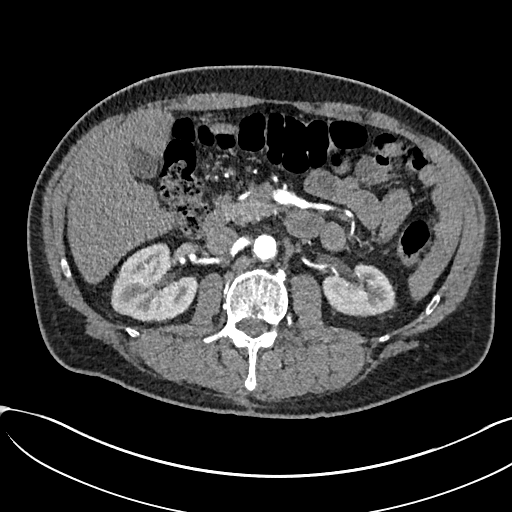
[im 174/252  bone]
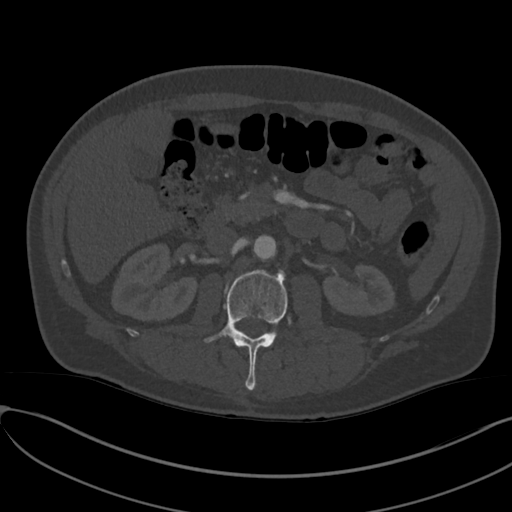
[im 194/252  soft-tissue]
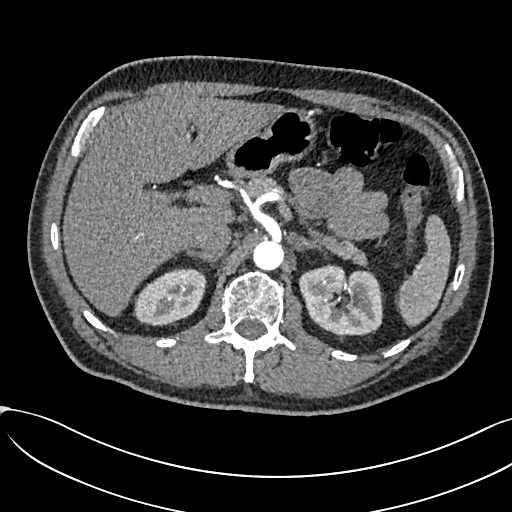
[im 213/252  soft-tissue]
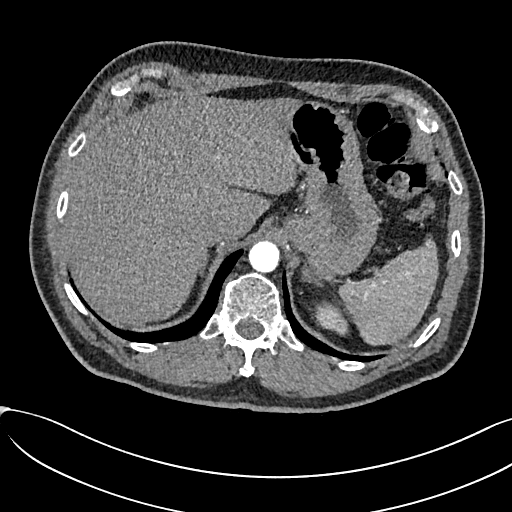
[im 232/252  soft-tissue]
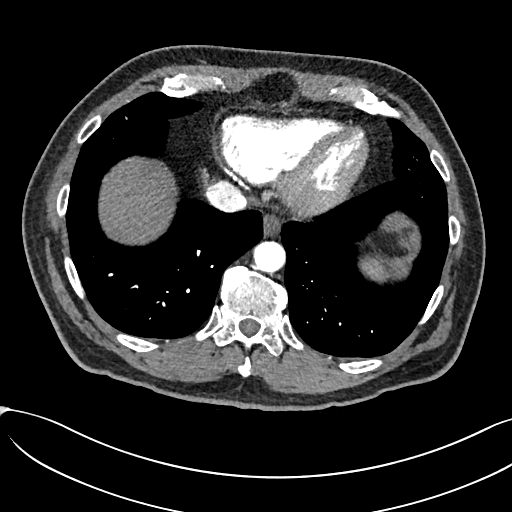

[Series 11: cor arterial · coronal · arterial · 0.87mm/px · 3 of 196 slices shown]
[im 49/196  soft-tissue]
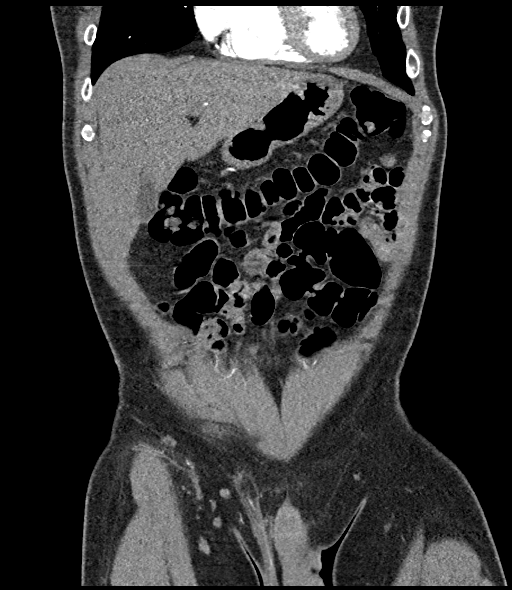
[im 98/196  soft-tissue]
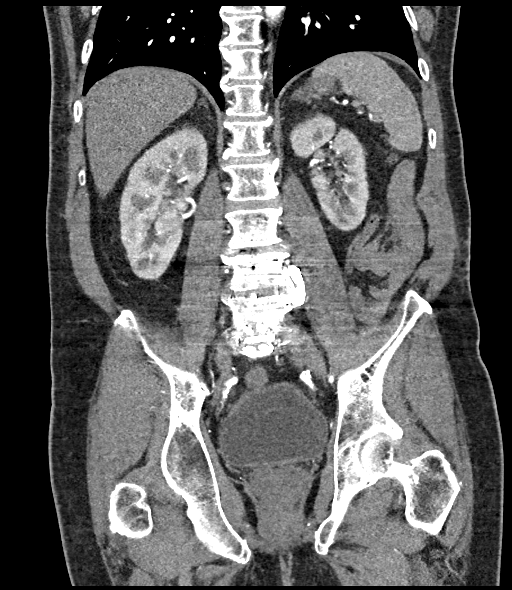
[im 147/196  soft-tissue]
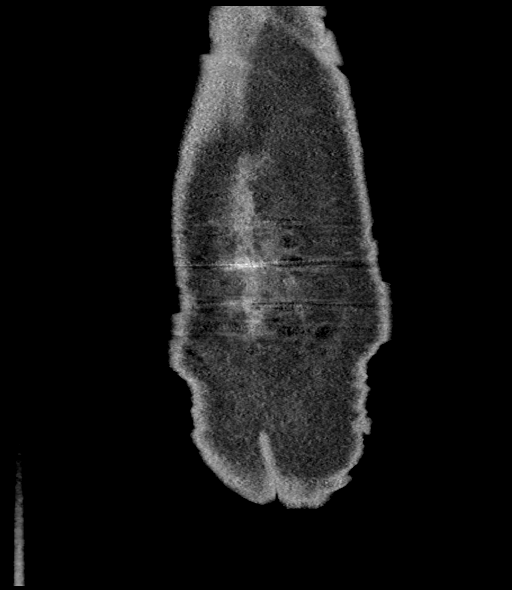

[15 of 46 positions shown; findings below may reference images not displayed]

FINDINGS: VASCULAR

Aorta: Normal caliber aorta without aneurysm, dissection, vasculitis
or significant stenosis. Calcific atherosclerosis.

Celiac: Patent without evidence of aneurysm, dissection, vasculitis
or significant stenosis.

SMA: Patent without evidence of aneurysm, dissection, vasculitis or
significant stenosis. Right hepatic artery arises from the SMA.

Renals: Both renal arteries are patent without evidence of aneurysm,
dissection, vasculitis, fibromuscular dysplasia or significant
stenosis.

IMA: Patent without evidence of aneurysm, dissection, vasculitis or
significant stenosis.

Inflow: Patent without evidence of aneurysm, dissection, vasculitis
or significant stenosis.

Proximal Outflow: Bilateral common femoral and visualized portions
of the superficial and profunda femoral arteries are patent without
evidence of aneurysm, dissection, vasculitis or significant
stenosis.

Veins: No obvious venous abnormality within the limitations of this
arterial phase study.

Review of the MIP images confirms the above findings.

NON-VASCULAR

Lower chest: No acute abnormality.

Hepatobiliary: No focal liver abnormality is seen. No gallstones,
gallbladder wall thickening, or biliary dilatation.

Pancreas: Unremarkable. No pancreatic ductal dilatation or
surrounding inflammatory changes.

Spleen: Normal in size without focal abnormality.

Adrenals/Urinary Tract: Adrenal glands are unremarkable. Kidneys are
normal, without renal calculi, focal lesion, or hydronephrosis.
Bladder is unremarkable.

Stomach/Bowel: Stomach is within normal limits. Appendectomy. No
evidence of bowel wall thickening, distention, or inflammatory
changes.

Lymphatic: Aortic atherosclerosis. No enlarged abdominal or pelvic
lymph nodes.

Reproductive: Prostate enlargement extending into floor of bladder.

Other: No abdominal wall hernia or abnormality. No abdominopelvic
ascites.

Musculoskeletal: No fracture is seen. L3-S1 posterior instrumented
fusion, left-sided L4-5 lateral fusion, and L5-S1 anterior fusion.
L3-S1 interbody fusion prostheses. Hardware is intact. No
periprosthetic fracture. There is lucency surrounding the L3 pedicle
screws compatible with loosening. L3-4 laminectomy postsurgical
changes noted. Findings related to lumbar fusion are stable in
comparison with prior lumbar spine CT.
IMPRESSION: 1. Patent mesenteric arterial system without large vessel occlusion,
aneurysm, or high-grade stenosis.
2.  Aortic Atherosclerosis (53830-9LW.W).
3. No acute finding as explanation for pain.
4. Prostate enlargement.
